# Patient Record
Sex: Male | Born: 1965
Health system: Southern US, Community
[De-identification: ages and names within clinical notes are randomized; demographics above are authoritative.]

## PROBLEM LIST (undated history)

## (undated) DIAGNOSIS — L309 Dermatitis, unspecified: Secondary | ICD-10-CM

## (undated) DIAGNOSIS — I2510 Atherosclerotic heart disease of native coronary artery without angina pectoris: Secondary | ICD-10-CM

## (undated) DIAGNOSIS — T7840XA Allergy, unspecified, initial encounter: Secondary | ICD-10-CM

## (undated) DIAGNOSIS — G4730 Sleep apnea, unspecified: Secondary | ICD-10-CM

## (undated) DIAGNOSIS — E782 Mixed hyperlipidemia: Secondary | ICD-10-CM

## (undated) DIAGNOSIS — Z0000 Encounter for general adult medical examination without abnormal findings: Secondary | ICD-10-CM

## (undated) DIAGNOSIS — O26891 Other specified pregnancy related conditions, first trimester: Secondary | ICD-10-CM

## (undated) DIAGNOSIS — I5020 Unspecified systolic (congestive) heart failure: Secondary | ICD-10-CM

## (undated) DIAGNOSIS — R51 Headache: Secondary | ICD-10-CM

## (undated) DIAGNOSIS — M545 Low back pain: Secondary | ICD-10-CM

## (undated) HISTORY — DX: Headache: R51

## (undated) HISTORY — DX: Allergy, unspecified, initial encounter: T78.40XA

## (undated) HISTORY — DX: Encounter for general adult medical examination without abnormal findings: Z00.00

## (undated) HISTORY — DX: Low back pain: M54.5

## (undated) HISTORY — PX: CARDIAC CATHETERIZATION: SHX172

## (undated) HISTORY — DX: Sleep apnea, unspecified: G47.30

## (undated) HISTORY — DX: Dermatitis, unspecified: L30.9

## (undated) HISTORY — DX: Other specified pregnancy related conditions, first trimester: O26.891

## (undated) HISTORY — DX: Mixed hyperlipidemia: E78.2

---

## 2003-05-02 ENCOUNTER — Encounter: Admission: RE | Admit: 2003-05-02 | Discharge: 2003-05-02 | Payer: Self-pay | Admitting: Family Medicine

## 2003-05-22 ENCOUNTER — Encounter: Admission: RE | Admit: 2003-05-22 | Discharge: 2003-05-22 | Payer: Self-pay | Admitting: Family Medicine

## 2003-10-22 ENCOUNTER — Encounter: Admission: RE | Admit: 2003-10-22 | Discharge: 2003-10-22 | Payer: Self-pay | Admitting: Sports Medicine

## 2004-06-30 ENCOUNTER — Ambulatory Visit: Payer: Self-pay | Admitting: Family Medicine

## 2005-01-10 ENCOUNTER — Emergency Department (HOSPITAL_COMMUNITY): Admission: EM | Admit: 2005-01-10 | Discharge: 2005-01-10 | Payer: Self-pay | Admitting: Emergency Medicine

## 2005-02-12 ENCOUNTER — Ambulatory Visit: Payer: Self-pay | Admitting: Family Medicine

## 2005-10-25 ENCOUNTER — Ambulatory Visit: Payer: Self-pay | Admitting: Sports Medicine

## 2007-03-28 ENCOUNTER — Ambulatory Visit: Payer: Self-pay | Admitting: Family Medicine

## 2007-04-25 ENCOUNTER — Encounter: Payer: Self-pay | Admitting: Family Medicine

## 2007-04-25 ENCOUNTER — Ambulatory Visit: Payer: Self-pay | Admitting: Family Medicine

## 2007-04-25 DIAGNOSIS — T7840XA Allergy, unspecified, initial encounter: Secondary | ICD-10-CM

## 2007-04-25 HISTORY — DX: Allergy, unspecified, initial encounter: T78.40XA

## 2007-04-25 LAB — CONVERTED CEMR LAB
BUN: 15 mg/dL (ref 6–23)
CO2: 26 meq/L (ref 19–32)
Chloride: 103 meq/L (ref 96–112)
Direct LDL: 110 mg/dL — ABNORMAL HIGH
Glucose, Bld: 90 mg/dL (ref 70–99)
LDL Cholesterol: 110 mg/dL

## 2007-04-26 ENCOUNTER — Encounter: Payer: Self-pay | Admitting: Family Medicine

## 2008-10-04 ENCOUNTER — Ambulatory Visit: Payer: Self-pay | Admitting: Family Medicine

## 2008-10-24 ENCOUNTER — Encounter: Admission: RE | Admit: 2008-10-24 | Discharge: 2008-10-24 | Payer: Self-pay | Admitting: Family Medicine

## 2008-11-05 ENCOUNTER — Telehealth (INDEPENDENT_AMBULATORY_CARE_PROVIDER_SITE_OTHER): Payer: Self-pay | Admitting: *Deleted

## 2008-11-29 ENCOUNTER — Encounter: Payer: Self-pay | Admitting: Family Medicine

## 2008-11-29 ENCOUNTER — Ambulatory Visit (HOSPITAL_BASED_OUTPATIENT_CLINIC_OR_DEPARTMENT_OTHER): Admission: RE | Admit: 2008-11-29 | Discharge: 2008-11-29 | Payer: Self-pay | Admitting: Family Medicine

## 2008-12-01 ENCOUNTER — Ambulatory Visit: Payer: Self-pay | Admitting: Internal Medicine

## 2008-12-09 ENCOUNTER — Telehealth: Payer: Self-pay | Admitting: *Deleted

## 2008-12-09 DIAGNOSIS — G4733 Obstructive sleep apnea (adult) (pediatric): Secondary | ICD-10-CM | POA: Insufficient documentation

## 2008-12-11 ENCOUNTER — Ambulatory Visit (HOSPITAL_BASED_OUTPATIENT_CLINIC_OR_DEPARTMENT_OTHER): Admission: RE | Admit: 2008-12-11 | Discharge: 2008-12-11 | Payer: Self-pay | Admitting: Family Medicine

## 2008-12-11 ENCOUNTER — Encounter: Payer: Self-pay | Admitting: Family Medicine

## 2008-12-27 ENCOUNTER — Telehealth: Payer: Self-pay | Admitting: Family Medicine

## 2009-01-14 ENCOUNTER — Encounter: Payer: Self-pay | Admitting: Family Medicine

## 2009-06-26 ENCOUNTER — Encounter: Payer: Self-pay | Admitting: Family Medicine

## 2009-07-03 ENCOUNTER — Encounter: Payer: Self-pay | Admitting: *Deleted

## 2009-07-04 ENCOUNTER — Encounter: Payer: Self-pay | Admitting: *Deleted

## 2009-10-16 ENCOUNTER — Telehealth: Payer: Self-pay | Admitting: Family Medicine

## 2009-12-03 ENCOUNTER — Telehealth: Payer: Self-pay | Admitting: Family Medicine

## 2009-12-03 ENCOUNTER — Ambulatory Visit: Payer: Self-pay | Admitting: Family Medicine

## 2009-12-03 DIAGNOSIS — S139XXA Sprain of joints and ligaments of unspecified parts of neck, initial encounter: Secondary | ICD-10-CM | POA: Insufficient documentation

## 2009-12-24 ENCOUNTER — Encounter: Payer: Self-pay | Admitting: Family Medicine

## 2009-12-24 ENCOUNTER — Ambulatory Visit: Payer: Self-pay | Admitting: Family Medicine

## 2009-12-24 LAB — CONVERTED CEMR LAB
BUN: 11 mg/dL (ref 6–23)
CO2: 28 meq/L (ref 19–32)
Glucose, Bld: 97 mg/dL (ref 70–99)
HCT: 42.8 % (ref 39.0–52.0)
MCV: 86.3 fL (ref 78.0–100.0)
RBC: 4.96 M/uL (ref 4.22–5.81)
RDW: 13.4 % (ref 11.5–15.5)
Total Protein: 7.4 g/dL (ref 6.0–8.3)

## 2009-12-25 ENCOUNTER — Encounter: Payer: Self-pay | Admitting: Family Medicine

## 2010-07-28 NOTE — Progress Notes (Signed)
Summary: refill  Phone Note Refill Request Call back at [phone] Message from:  Patient  Refills Requested: Medication #1:  ALLEGRA 60 MG TABS 1 tab by mouth two times a day [facility]  Initial call taken by: De Nurse,  October 16, 2009 11:21 AM    Prescriptions: ALLEGRA 60 MG TABS (FEXOFENADINE HCL) 1 tab by mouth two times a day  #60 x 0   Entered and Authorized by:   Angeline Slim MD   Signed by:   Angeline Slim MD on 10/16/2009   Method used:   [facility]        [facility]  [place] [address]       [address]       [place], [place]  [address]       Ph: [phone] or [phone]       Fax: [phone]   RxID:   [id number]

## 2010-07-28 NOTE — Assessment & Plan Note (Signed)
Summary: cpe,tcb   Vital Signs:  Patient profile:   45 year old male Height:      69 inches Weight:      176.0 pounds BMI:     26.08 Temp:     97.0 degrees F Pulse rate:   63 / minute BP sitting:   131 / 80  (right arm)  Vitals Entered By: Starleen Blue RN 12/24/2009 CC: cpe Is Patient Diabetic? No Pain Assessment Patient in pain? no        Primary Care Provider:  Jerrell Mangel MD  CC:  cpe.  History of Present Illness: 45 y/o M here for cpe  1. OSA: Using CPAP x 1 year.  Feeling much better now that he is using CPAP.  He is sleeping better.  He tried to not use it for the past 2 months and has had more frequent headaches.  He attributes this to not using CPAP.   2.  Back/neck pain:  was seen earlier this month for cervical muscle strain.  Dr Clotilde Dieter rx flexeril.  He took this with aleve and it relieved the neck pain.  He still has occasional neck pain, esp when he works.  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  When he is at home on the weekend or on vacation he has noticed less neck strain.      Habits & Providers  Alcohol-Tobacco-Diet     Alcohol drinks/day: 0     Tobacco Status: never     Diet Comments: Vegetarian diet  Exercise-Depression-Behavior     Does Patient Exercise: yes     Exercise Counseling: not indicated; exercise is adequate     Type of exercise: weights, cardio     Exercise (avg: min/session): 60     Times/week: 3     Have you felt down or hopeless? no     Have you felt little pleasure in things? no     Depression Counseling: not indicated; screening negative for depression     STD Risk: never     Drug Use: never     Seat Belt Use: always     Sun Exposure: frequent  Current Medications (verified): 1)  Allegra 60 Mg Tabs (Fexofenadine Hcl) .Marland Kitchen.. 1 Tab By Mouth Two Times A Day 2)  Fluticasone Propionate 50 Mcg/act Susp (Fluticasone Propionate) .... 2 Sprays Each Nostril Daily  Allergies (verified): No Known Drug Allergies  Past  History:  Family History: Last updated: 12/24/2009 Paternal grandmother with esophageal cancer Father: Hypotension Sisters and Brothers: healthy  Social History: Last updated: 12/24/2009 Patient is from [place] but lives in [place] with wife and 2 kids (8y/o son, 62 y/o daughter).  Wife works from home, but her office is in [place].  Remainder of family lives in [place].   Both parents alive with no active disease.   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   Enjoys tennis. No tobacco, etoh, or illicits. Vegetarian  Risk Factors: Alcohol Use: 0 (12/24/2009) Diet: Vegetarian diet (12/24/2009) Exercise: yes (12/24/2009)  Risk Factors: Smoking Status: never (12/24/2009)  Past Medical History: Allergic rhinitis OSA, on CPAP 5-8hrs a day.   Family History: Paternal grandmother with esophageal cancer Father: Hypotension Sisters and Brothers: healthy  Social History: Patient is from [place] but lives in [place] with wife and 2 kids (8y/o son, 64 y/o daughter).  Wife works from home, but her office is in [place].  Remainder of family lives in [place].  Both parents alive with no active disease.   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   Enjoys tennis. No tobacco, etoh, or illicits. VegetarianDoes Patient Exercise:  yes STD Risk:  never Drug Use:  never Seat Belt Use:  always Sun Exposure-Excessive:  frequent  Review of Systems  The patient denies fever, weight loss, weight gain, vision loss, decreased hearing, hoarseness, chest pain, syncope, dyspnea on exertion, peripheral edema, prolonged cough, headaches, hemoptysis, abdominal pain, melena, hematochezia, severe indigestion/heartburn, hematuria, incontinence, muscle weakness, suspicious skin lesions, difficulty walking, depression, unusual weight change, and abnormal bleeding.         some bilateral knee pain, esp goin up steps.    Physical Exam  General:  Well-developed,well-nourished,in no acute distress; alert,appropriate and  cooperative throughout examination. vitals reviewed Head:  Normocephalic and atraumatic without obvious abnormalities. No apparent alopecia or balding. Eyes:  No corneal or conjunctival inflammation noted. EOMI. Perrla. Funduscopic exam benign, without hemorrhages, exudates or papilledema. Vision grossly normal. Ears:  External ear exam shows no significant lesions or deformities.  Otoscopic examination reveals clear canals, tympanic membranes are intact bilaterally without bulging, retraction, inflammation or discharge. Hearing is grossly normal bilaterally. Nose:  External nasal examination shows no deformity or inflammation. Nasal mucosa are pink and moist without lesions or exudates. Mouth:  Oral mucosa and oropharynx without lesions or exudates.  Teeth in good repair. Neck:  No deformities, masses, or tenderness noted. Lungs:  Normal respiratory effort, chest expands symmetrically. Lungs are clear to auscultation, no crackles or wheezes. Heart:  Normal rate and regular rhythm. S1 and S2 normal without gallop, murmur, click, rub or other extra sounds. Abdomen:  Bowel sounds positive,abdomen soft and non-tender without masses, organomegaly or hernias noted. Msk:  No deformity or scoliosis noted of thoracic or lumbar spine.   Pulses:  R and L carotid,radial,femoral,dorsalis pedis and posterior tibial pulses are full and equal bilaterally Extremities:  No clubbing, cyanosis, edema, or deformity noted with normal full range of motion of all joints.   Neurologic:  No cranial nerve deficits noted. Station and gait are normal. Plantar reflexes are down-going bilaterally. DTRs are symmetrical throughout. Sensory, motor and coordinative functions appear intact. Skin:  Intact without suspicious lesions or rashes Cervical Nodes:  No lymphadenopathy noted Axillary Nodes:  No palpable lymphadenopathy Inguinal Nodes:  No significant adenopathy Psych:  Cognition and judgment appear intact. Alert and  cooperative with normal attention span and concentration. No apparent delusions, illusions, hallucinations   Impression & Recommendations:  Problem # 1:  PREVENTIVE HEALTH CARE (ICD-V70.0) Healthy male.  No risky behaviors.  Works in Consulting civil engineer.  Will check CBC and Cmet today.  Last FLP done in 2008 with LDL 110.  No risk factors for CAD, DM.   Weight is stable.  Pt is exercising 3x/week at 60 min each time.  No alcohol, drugs, tobacco.   We discussed colonoscopy when he turns 49.  We also discussed flu vaccine. This last winter he got vaccinated from work.  He does travel to [place] every 2 yrs, so we can consider doing PPD.  Orders: [facility] - Est  40-64 yrs ([address])  Problem # 2:  SLEEP APNEA, OBSTRUCTIVE (ICD-327.23) Assessment: Improved Doing well on CPAP.  He brought me his settings.  Will scan into file.   Orders: Comp Met-[facility] [phone]) CBC-[facility] ([address]) [facility] - Est  40-64 yrs ([address])  Complete Medication List: 1)  Allegra 60 Mg Tabs (Fexofenadine hcl) .Marland Kitchen.. 1 tab by mouth two times a day 2)  Fluticasone Propionate 50 Mcg/act Susp (Fluticasone propionate) .... 2 sprays each nostril daily   Prevention & Chronic Care Immunizations   Influenza vaccine: given  (03/28/2008)   Influenza vaccine due: 03/28/2009    Tetanus booster: 04/25/2007: Tdap   Tetanus booster due: 04/24/2017    Pneumococcal vaccine: Not documented  Other Screening   Smoking status: never  (12/24/2009)  Lipids   Total Cholesterol: Not documented   LDL: 110  (04/25/2007)   LDL Direct: 110  (04/25/2007)   HDL: Not documented   Triglycerides: Not documented

## 2010-07-28 NOTE — Miscellaneous (Signed)
Summary: cpap  Clinical Lists Changes cpap authorized thru 4/11.Golden Circle RN  July 03, 2009 3:50 PM

## 2010-07-28 NOTE — Letter (Signed)
Summary: Generic Letter: Lab result  [facility]  [address]   [place], [place] [address]   Phone: [phone]  Fax: [phone]    12/25/2009  Michael Gibbs [address] [place], [place]  [address]  Dear [name],   Your lab result from blood test for yesterday showed:  No Anemia. Normal WBC. Normal Electrolytes. Normal Kidney funtion. Normal Liver funtion.       Sincerely,   Valentin Benney MD  Appended Document: Generic Letter: Lab result letter mailed.

## 2010-07-28 NOTE — Letter (Signed)
Summary: Letter of Deniel from [facility]  Letter of Deniel from [employer]   Imported By: Clydell Hakim 07/14/2009 14:59:27  _____________________________________________________________________  External Attachment:    Type:   Image     Comment:   External Document

## 2010-07-28 NOTE — Progress Notes (Signed)
Summary: triage  Phone Note Call from Patient Call back at Work Phone [phone]   Caller: Patient Summary of Call: Has a catch in his back and wondering what he can do for it? Initial call taken by: Clydell Hakim,  December 03, 2009 8:43 AM  Follow-up for Phone Call        started yesterday am. thinks he slept "incorrectly" has not taken any thing for the pain. states this is the 3rd or 4th time in 2 yrs. work in with Dr. Clotilde Dieter at 1:30 Follow-up by: Golden Circle RN,  December 03, 2009 8:50 AM

## 2010-07-28 NOTE — Miscellaneous (Signed)
Summary: cpap not covered  Clinical Lists Changes pt went to [facility] to get CPAP titrated. per letter from [facility], this will not be covered since we did not obtain a preauthorization. we cannot bill pt for this either. letter to pcp & K. Malen Gauze, Charity fundraiser.Golden Circle RN  July 04, 2009 4:48 PM called pt and lmvm to call us back.  see previous notes. Arlyss Repress CMA,  July 08, 2009 3:28 PM pt never called back.Arlyss Repress CMA,  July 29, 2009 5:25 PM

## 2010-07-28 NOTE — Assessment & Plan Note (Signed)
Summary: neck (trapezius) muscle strain   Vital Signs:  Patient profile:   45 year old male Weight:      175.5 pounds Temp:     97.6 degrees F oral Pulse rate:   62 / minute Pulse rhythm:   regular BP sitting:   118 / 78  (right arm) Cuff size:   regular  Vitals Entered By: Loralee Pacas CMA (December 03, 2009 1:43 PM) CC: neck pain Is Patient Diabetic? No Pain Assessment Patient in pain? yes     Location: neck, upper back Intensity: 7 Type: sharp Onset of pain  Constant Comments pt states that he woke up yesterday and neck and back was hurting when he turns his head the pain gets worse   Primary Care Provider:  Cat Ta MD  CC:  neck pain.  History of Present Illness: Neck pain: Pt has had this same next pain 4 times. The first time it was 3 years ago when he was lifting his dauhter our of the car and instantly felt sharp pain in his neck. He said it went away in 2 days. He did almost the same thing 2 times last year each taking about 4 days to go away.  Yesterday morning he woke up with sharp pain and loss of motion in his neck on the left side. He did his regular exercises on Monday and did not feel any pain and did not have any injury. He has not taken anything by mouth, he has rubbed some Vicks Vapor rub on his muscle. He has scheduled a massage for tomorrow.  Current Medications (verified): 1)  Allegra 60 Mg Tabs (Fexofenadine Hcl) .Marland Kitchen.. 1 Tab By Mouth Two Times A Day 2)  Fluticasone Propionate 50 Mcg/act Susp (Fluticasone Propionate) .... 2 Sprays Each Nostril Daily 3)  Patanol 0.1 % Soln (Olopatadine Hcl) .Marland Kitchen.. 1 Drop Each Eye Twice A Day For Eye Allergies 4)  Cyclobenzaprine Hcl 10 Mg Tabs (Cyclobenzaprine Hcl) .... Take 1 Pill At Bedtime  Allergies (verified): No Known Drug Allergies  Review of Systems        vitals reviewed and pertinent negatives and positives seen in HPI   Physical Exam  General:  Well-developed,well-nourished,in no acute distress;  alert,appropriate and cooperative throughout examination Head:  decreased ROM while turning head to right and left.  Neck:  no tenderness along midline of neck but tight muscles along left trapezius muscles and tenderness to palpation to trapezius muscle.  Msk:  no tenderness along midline of neck but tight muscles along left trapezius muscles and tenderness to palpation to trapezius muscle. no joint tenderness, no joint swelling, and no joint warmth.     Impression & Recommendations:  Problem # 1:  CERVICAL MUSCLE STRAIN (ICD-847.0) Assessment New (see instruction). Told pt he could go back to his normal exercise when the pain is gone (Sunday or Monday). Demonstrated proper body mechanics for him.   His updated medication list for this problem includes:    Cyclobenzaprine Hcl 10 Mg Tabs (Cyclobenzaprine hcl) .Marland Kitchen... Take 1 pill at bedtime  Orders: [facility]- Est Level  3 ([address])  Complete Medication List: 1)  Allegra 60 Mg Tabs (Fexofenadine hcl) .Marland Kitchen.. 1 tab by mouth two times a day 2)  Fluticasone Propionate 50 Mcg/act Susp (Fluticasone propionate) .... 2 sprays each nostril daily 3)  Patanol 0.1 % Soln (Olopatadine hcl) .Marland Kitchen.. 1 drop each eye twice a day for eye allergies 4)  Cyclobenzaprine Hcl 10 Mg Tabs (Cyclobenzaprine hcl) .... Take 1  pill at bedtime  Patient Instructions: 1)  Use the heating pad when home for 20 minutes at a time.  2)  Use Aleve every 8 hours.  3)  Use Flexerill at night (it will make you sleepy so don't drive with it). 4)  Get Capsasin cream and run it into the muscle.  Prescriptions: CYCLOBENZAPRINE HCL 10 MG TABS (CYCLOBENZAPRINE HCL) take 1 pill at bedtime  #14 x 0   Entered and Authorized by:   Jamie Brookes MD   Signed by:   Jamie Brookes MD on 12/03/2009   Method used:   [facility]  [place] [address]       [address]       [place], [place]  [address]       Ph: [phone] or [phone]       Fax: [phone]   RxID:    [id number]

## 2010-10-15 IMAGING — CR DG CHEST 2V
2 series · 2 of 2 positions shown · non-contrast
Comparison: None available.

CLINICAL DATA: Hemoptysis.  786.3.

CHEST - 2 VIEW

[w chest pa]
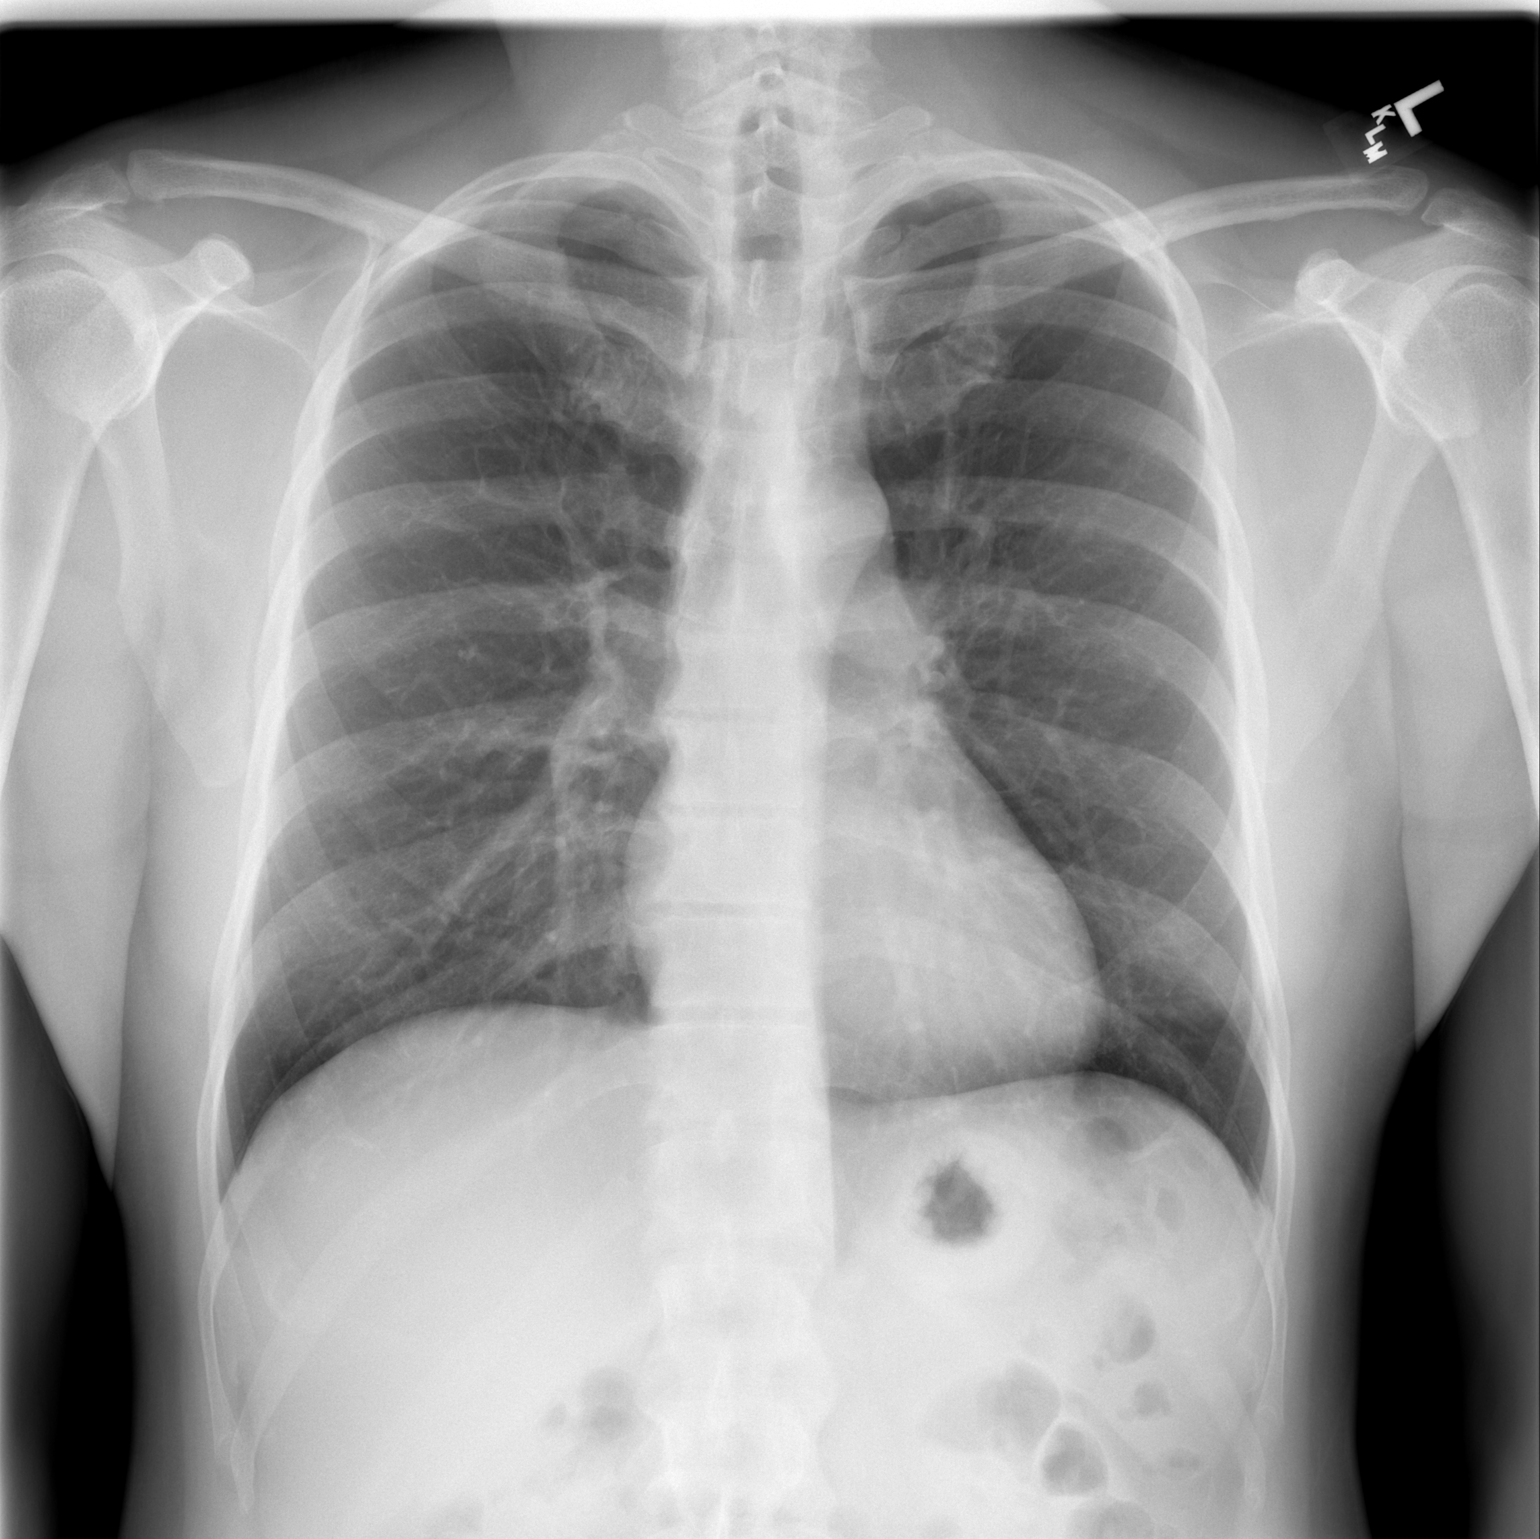

[w chest lat]
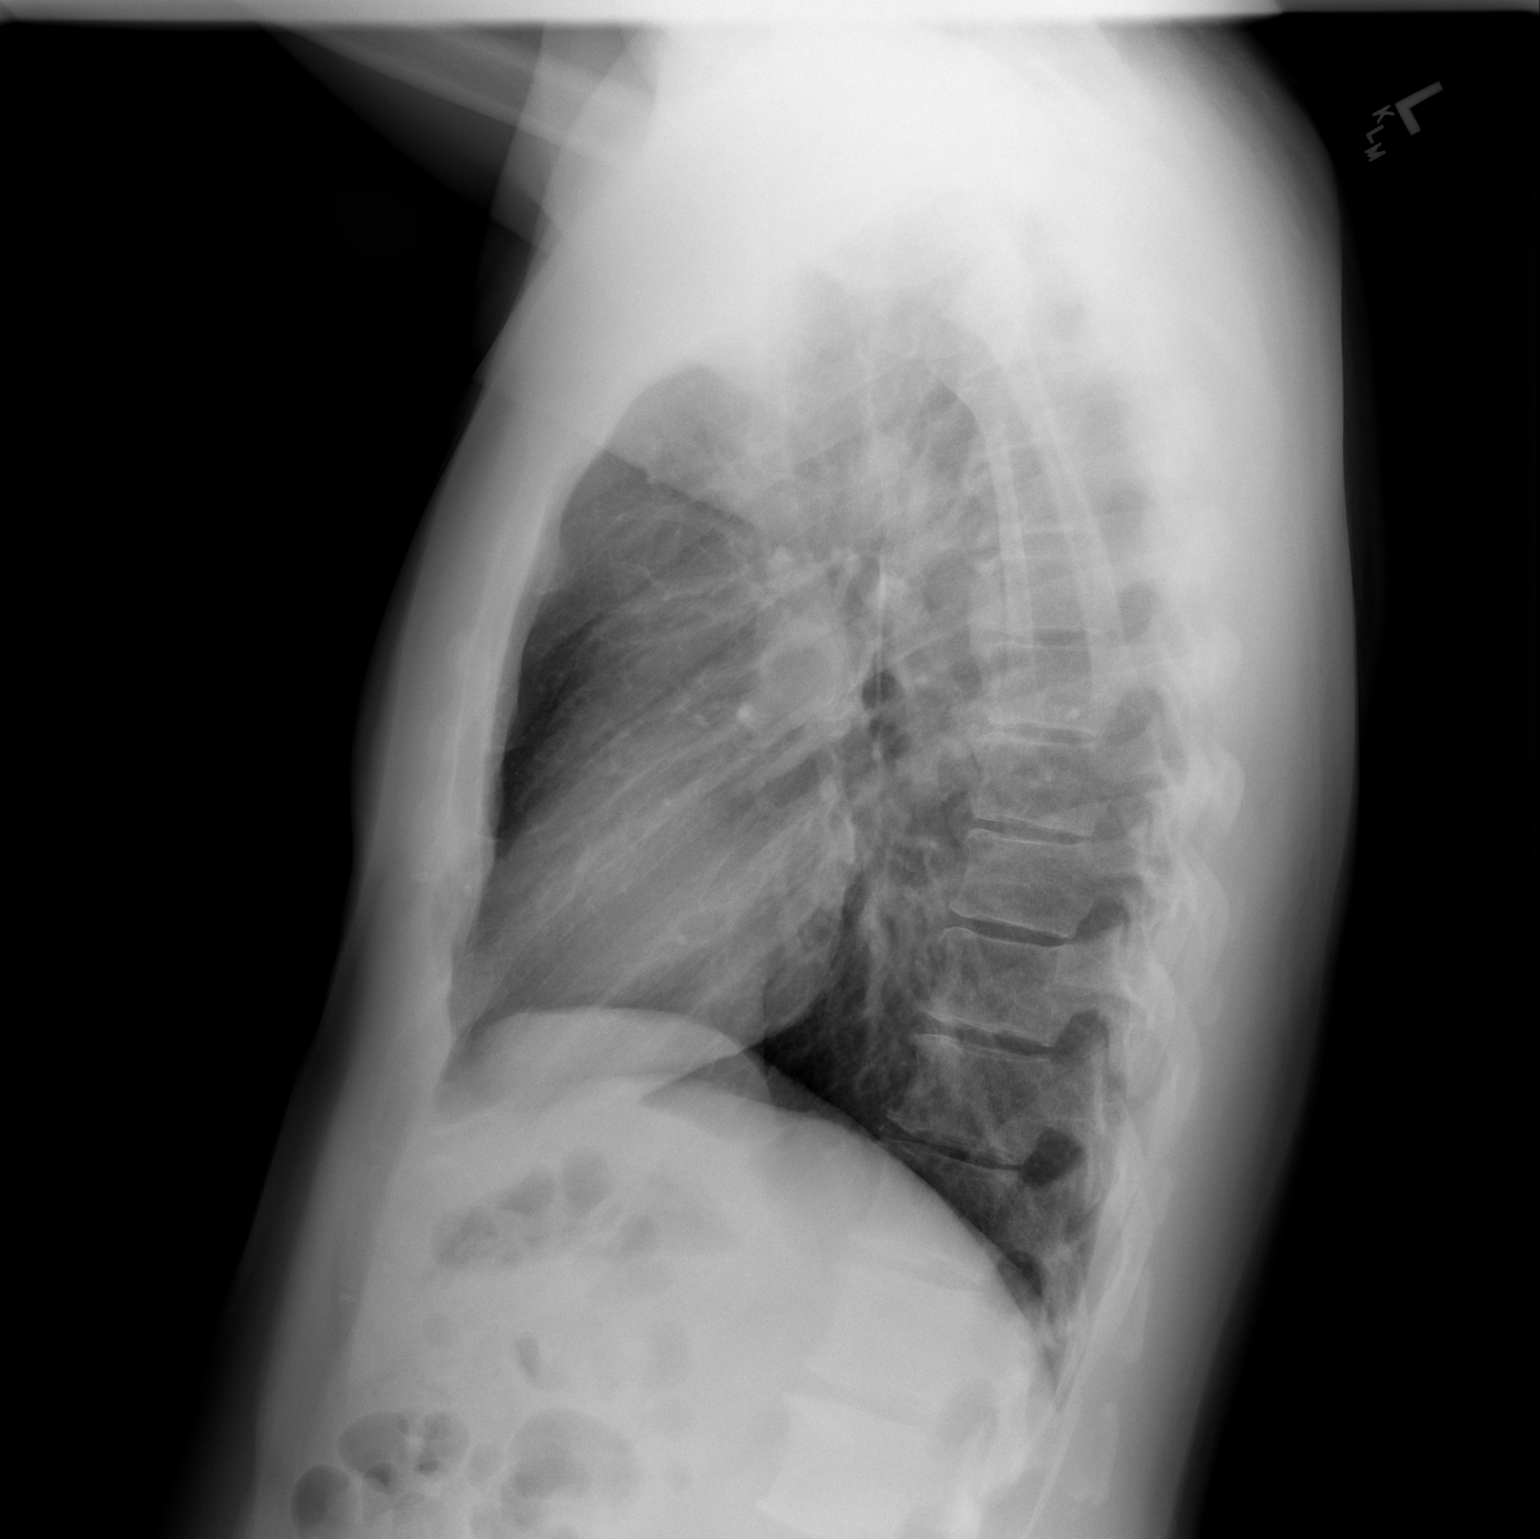

[2 of 2 positions shown; findings below may reference images not displayed]

FINDINGS: The cardiopericardial silhouette is within normal limits
for size.  The lungs are clear.  Minimal degenerative changes are
noted in the thoracic spine.  The visualized soft tissues and bony
thorax are otherwise unremarkable.
IMPRESSION: No acute cardiopulmonary disease.

## 2010-11-10 NOTE — Procedures (Signed)
Michael Gibbs, Michael Gibbs       ACCOUNT NO.:  [id number]   MEDICAL RECORD NO.:  [id number]          PATIENT TYPE:  OUT   LOCATION:  SLEEP CENTER                 FACILITY:  [facility]   PHYSICIAN:  Clinton D. Maple Hudson, MD, FCCP, FACPDATE OF BIRTH:  1965-06-09   DATE OF STUDY:  11/29/2008                            NOCTURNAL POLYSOMNOGRAM   REFERRING PHYSICIAN:  Norton Blizzard, M.D.   REFERRING PHYSICIAN:  Norton Blizzard, MD   INDICATION FOR STUDY:  Insomnia with sleep apnea.   EPWORTH SLEEPINESS SCORE:  8/24, BMI 26, weight 176 pounds, height 69  inches, neck 16 inches.   MEDICATIONS:  Home medications, none listed.   SLEEP ARCHITECTURE:  Total sleep time 316 minutes with sleep efficiency  90.5%.  Stage I was 2.2%, stage II 65.3%, stage III 2.7%, REM 29.7% of  total sleep time.  Sleep latency 19 minutes, REM latency 56 minutes,  awake after sleep onset 11.5 minutes, arousal index 36.6.  No bedtime  medication was taken.   RESPIRATORY DATA:  Apnea-hypopnea index (AHI) 7.8 per hour.  A total of  41 events was scored including 29 obstructive apneas, 2 mixed apneas,  and 10 hypopneas.  Events were seen in all positions but more common  while supine.  REM AHI 21.1.  An additional 9 events were scored with  respiratory effort related to arousal for accumulative respiratory  disturbance index (RDI) of 9.5 per hour.  There were insufficient events  to permit CPAP titration by split protocol on the study night.   OXYGEN DATA:  Moderately loud snoring with oxygen desaturation to a  nadir of 78%.  Mean oxygen saturation through the study was 97% on room  air.   CARDIAC DATA:  Sinus rhythm with occasional PVC.   MOVEMENT-PARASOMNIA:  No significant movement disturbance.  No bathroom  trips.   IMPRESSIONS-RECOMMENDATIONS:  1. Mild obstructive sleep apnea/hypopnea syndrome, apnea-hypopnea      index 7.8 per hour.  Events were more common while supine.      Moderately loud snoring with oxygen  desaturation to a nadir of 78%.  2. There were insufficient events on the study night to permit      continuous positive airway pressure titration by      split protocol in the time allowed.  Consider return for CPAP      titration if more conservative measures are inappropriate or      ineffective.      Clinton D. Maple Hudson, MD, [facility], FACP  Diplomate, Biomedical engineer of Sleep Medicine  Electronically Signed     CDY/MEDQ  D:  12/01/2008 15:03:57  T:  12/02/2008 03:31:00  Job:  [id number]

## 2010-11-10 NOTE — Procedures (Signed)
NAMELUCUS, LAMBERTSON       ACCOUNT NO.:  [id number]   MEDICAL RECORD NO.:  [id number]          PATIENT TYPE:  OUT   LOCATION:  SLEEP CENTER                 [facility]ACILITY[facility]   PHYSICIAN:  Clinton D. Maple Hudson, MD, FCCP, FACPDATE OF BIRTH:  11-22-1964   DATE OF STUDY:  12/11/2008                            NOCTURNAL POLYSOMNOGRAM   REFERRING PHYSICIAN:  Norton Blizzard, M.D.   REFERRING PHYSICIAN:  Norton Blizzard, MD   INDICATIONS FOR STUDY:  Hypersomnia with sleep apnea.   EPWORTH SLEEPINESS SCORE:  Epworth sleepiness score 3/24.  BMI 26.  Weight 176 pounds, height 69 inches.  Neck 16 inches.   HOME MEDICATION:  None charted.   A baseline diagnostic NPSG on November 29, 2008, gave AHI of 7.8 per hour.  CPAP titration is requested.   SLEEP ARCHITECTURE:  Total sleep time 322 minutes with sleep efficiency  88.2%.  Stage I was 4.3%, stage II 62.1%, stage III 0.6%, REM 32.9% of  total sleep time.  Sleep latency 9 minutes, REM latency 51.5 minutes,  awake after sleep onset 34 minutes, arousal index 31.9.  No bedtime  medication was taken.   RESPIRATORY DATA:  CPAP titration protocol.  CPAP was titrated to 10  CWP, AHI 0 per hour.  He wore a medium ResMed Quattro full-face mask  with heated humidifier.   OXYGEN DATA:  Moderate snoring before CPAP control, completely  suppressed by CPAP.  Mean oxygen saturation on CPAP was 97.5% on room  air.   CARDIAC DATA:  Sinus rhythm with occasional PAC.   MOVEMENT/PARASOMNIA:  No significant movement disturbance.  No bathroom  trips.   IMPRESSION/RECOMMENDATIONS:  1. Successful CPAP titration to 10 CWP, AHI 0 per hour.  He wore a      medium ResMed Quattro full-face mask with heated humidifier.  2. Baseline diagnostic NPSG on November 29, 2008, gave an AHI of 7.8 per      hour.  3. Sleep architecture on the current study was significant for      increased percentage of time in REM, consistent with REM rebound as      a therapeutic response to  CPAP with improved sleep architecture.      Clinton D. Maple Hudson, MD, [facility], FACP  Diplomate, Biomedical engineer of Sleep Medicine  Electronically Signed     CDY/MEDQ  D:  12/15/2008 09:22:17  T:  12/15/2008 21:53:16  Job:  [id number]

## 2011-03-08 ENCOUNTER — Telehealth: Payer: Self-pay | Admitting: Family Medicine

## 2011-03-08 NOTE — Telephone Encounter (Signed)
Request form for cpap equipment sent in for me to sign/approve.  Will sign these forms so that pt can continue cpap, but pt has not had recent eval here at the clinic.  Pt needs to come in for complete physical and follow up on chronic medical problems.  Will ask administrative staff to call pt and schedule f/up appointment.

## 2011-04-02 ENCOUNTER — Encounter: Payer: Self-pay | Admitting: Family Medicine

## 2011-04-02 ENCOUNTER — Ambulatory Visit (INDEPENDENT_AMBULATORY_CARE_PROVIDER_SITE_OTHER): Payer: BC Managed Care – PPO | Admitting: Family Medicine

## 2011-04-02 VITALS — BP 115/71 | HR 61 | Wt 175.0 lb

## 2011-04-02 DIAGNOSIS — Z0000 Encounter for general adult medical examination without abnormal findings: Secondary | ICD-10-CM

## 2011-04-02 DIAGNOSIS — G4730 Sleep apnea, unspecified: Secondary | ICD-10-CM

## 2011-04-02 DIAGNOSIS — G4733 Obstructive sleep apnea (adult) (pediatric): Secondary | ICD-10-CM

## 2011-04-02 DIAGNOSIS — Z719 Counseling, unspecified: Secondary | ICD-10-CM

## 2011-04-02 DIAGNOSIS — J301 Allergic rhinitis due to pollen: Secondary | ICD-10-CM

## 2011-04-02 DIAGNOSIS — Z7189 Other specified counseling: Secondary | ICD-10-CM

## 2011-04-02 HISTORY — DX: Encounter for general adult medical examination without abnormal findings: Z00.00

## 2011-04-02 NOTE — Progress Notes (Signed)
  Subjective:    [name], male    DOB: 18-Jul-1965, 45 y.o.   MRN: [id number]  HPI Patient is here for annual physical:  Past history, family history, surgical history, medications all reviewed and updated.  CPAP- States that he wears this every night, he can tell a difference if he doesn't wear it with daytime sleepiness, he still has some tiredness on awakening to 3 days per week, he has noticed this for the past 6-7 months. He spoke to [facility] and they changed some of the pieces to ensure a good fit. He states he goes to bed at 11 PM to 6 AM, on the weekends he is able to sleep longer and does feel more more rested.   Social history- Patient denies alcohol, tobacco, drugs. Patient exercises to 3 times per week for one hour in gym. He also plays tennis occasionally.     Review of Systems As per above. No chest pain. No shortness of breath.    Objective:   Physical Exam  Constitutional: He is oriented to person, place, and time. He appears well-developed and well-nourished.  HENT:  Head: Normocephalic and atraumatic.  Mouth/Throat: Oropharynx is clear and moist. No oropharyngeal exudate.  Eyes: Right eye exhibits no discharge. Left eye exhibits no discharge. No scleral icterus.  Cardiovascular: Normal rate and regular rhythm.   No murmur heard. Pulmonary/Chest: Effort normal. No respiratory distress.  Abdominal: Soft. He exhibits no distension.  Musculoskeletal: He exhibits no edema.  Neurological: He is alert and oriented to person, place, and time.  Skin: No rash noted.       Scattered small benign-appearing moles and cherry angiomas on back and abdomen, and arms.  Psychiatric: He has a normal mood and affect. His behavior is normal.          Assessment & Plan:

## 2011-04-02 NOTE — Assessment & Plan Note (Signed)
Patient given sleep hygiene handout. Patient is to review to see if he can improve sleep quality. If patient continues to have 2-3 nights where he does not feel rested in the morning per week after improving sleep hygiene-patient is to let me know and I will send her back to the sleep apnea Dr. to see if settings on CPAP need to be adjusted. Patient has been using current settings x2 years.

## 2011-04-02 NOTE — Patient Instructions (Signed)
Sleep quality: Review sleep hygiene handout.  See if there are things you can do to improve your quality of sleep.  If after changing these things you still wake up tired in the morning let me know and we can send you back to the sleep doctor to see if any changes need to be made to the settings on your CPAP.  Lab work: Return for fasting lab work.  Just make an appointment with the lab.  I will mail the results to you.  Keep up the exercise!  Return in 1 year for physical or sooner if needed.

## 2011-04-02 NOTE — Assessment & Plan Note (Signed)
We'll get flu shot at work next week, will screen cholesterol-patient to return for fasting lipid panel. Will also obtain complete metabolic panel in the case the patient needs medication for cholesterol will need a baseline liver function. Encouraged patient to stay active-patient currently going to gym to 3 times per week for one hour. Also plays tennis.

## 2011-04-21 ENCOUNTER — Other Ambulatory Visit: Payer: BC Managed Care – PPO

## 2011-04-21 DIAGNOSIS — G4730 Sleep apnea, unspecified: Secondary | ICD-10-CM

## 2011-04-21 LAB — COMPREHENSIVE METABOLIC PANEL
ALT: 14 U/L (ref 0–53)
AST: 18 U/L (ref 0–37)
Alkaline Phosphatase: 90 U/L (ref 39–117)
Creat: 0.81 mg/dL (ref 0.50–1.35)
Total Bilirubin: 1.2 mg/dL (ref 0.3–1.2)

## 2011-04-21 LAB — LIPID PANEL
Cholesterol: 169 mg/dL (ref 0–200)
HDL: 36 mg/dL — ABNORMAL LOW (ref >39)
LDL Cholesterol: 111 mg/dL — ABNORMAL HIGH (ref 0–99)
Total CHOL/HDL Ratio: 4.7 Ratio
Triglycerides: 112 mg/dL (ref <150)
VLDL: 22 mg/dL (ref 0–40)

## 2011-04-21 NOTE — Progress Notes (Signed)
CMP AND FLP DONE TODAY Michael Gibbs 

## 2011-04-28 ENCOUNTER — Encounter: Payer: Self-pay | Admitting: Family Medicine

## 2011-09-20 ENCOUNTER — Encounter: Payer: Self-pay | Admitting: Family Medicine

## 2011-09-20 ENCOUNTER — Ambulatory Visit (INDEPENDENT_AMBULATORY_CARE_PROVIDER_SITE_OTHER): Payer: BC Managed Care – PPO | Admitting: Family Medicine

## 2011-09-20 ENCOUNTER — Other Ambulatory Visit: Payer: Self-pay | Admitting: Family Medicine

## 2011-09-20 DIAGNOSIS — Z0000 Encounter for general adult medical examination without abnormal findings: Secondary | ICD-10-CM

## 2011-09-20 DIAGNOSIS — G4730 Sleep apnea, unspecified: Secondary | ICD-10-CM | POA: Insufficient documentation

## 2011-09-20 DIAGNOSIS — L309 Dermatitis, unspecified: Secondary | ICD-10-CM

## 2011-09-20 DIAGNOSIS — L259 Unspecified contact dermatitis, unspecified cause: Secondary | ICD-10-CM

## 2011-09-20 DIAGNOSIS — B354 Tinea corporis: Secondary | ICD-10-CM

## 2011-09-20 DIAGNOSIS — Z719 Counseling, unspecified: Secondary | ICD-10-CM

## 2011-09-20 DIAGNOSIS — Z7189 Other specified counseling: Secondary | ICD-10-CM

## 2011-09-20 DIAGNOSIS — R51 Headache: Secondary | ICD-10-CM

## 2011-09-20 DIAGNOSIS — T7840XA Allergy, unspecified, initial encounter: Secondary | ICD-10-CM

## 2011-09-20 MED ORDER — CLOTRIMAZOLE-BETAMETHASONE 1-0.05 % EX CREA: TOPICAL_CREAM | Freq: Two times a day (BID) | CUTANEOUS | Status: AC

## 2011-09-20 NOTE — Patient Instructions (Signed)
Ringworm, Body [Tinea Corporis] Ringworm is a fungal infection of the skin and hair. Another name for this problem is Tinea Corporis. It has nothing to do with worms. A fungus is an organism that lives on dead cells (the outer layer of skin). It can involve the entire body. It can spread from infected pets. Tinea corporis can be a problem in wrestlers who may get the infection form other players/opponents, equipment and mats. DIAGNOSIS  A skin scraping can be obtained from the affected area and by looking for fungus under the microscope. This is called a KOH examination.  HOME CARE INSTRUCTIONS   Ringworm may be treated with a topical antifungal cream, ointment, or oral medications.   If you are using a cream or ointment, wash infected skin. Dry it completely before application.   Scrub the skin with a buff puff or abrasive sponge using a shampoo with ketoconazole to remove dead skin and help treat the ringworm.   Have your pet treated by your veterinarian if it has the same infection.  SEEK MEDICAL CARE IF:   Your ringworm patch (fungus) continues to spread after 7 days of treatment.   Your rash is not gone in 4 weeks. Fungal infections are slow to respond to treatment. Some redness (erythema) may remain for several weeks after the fungus is gone.   The area becomes red, warm, tender, and swollen beyond the patch. This may be a secondary bacterial (germ) infection.   You have a fever.  Document Released: 06/11/2000 Document Revised: 06/03/2011 Document Reviewed: 11/22/2008 [facility] Patient Information 2012 [place], [place]   .Preventive Care for Adults, Male A healthy lifestyle and preventative care can promote health and wellness. Preventative health guidelines for men include the following key practices:  A routine yearly physical is a good way to check with your caregiver about your health and preventative screening. It is a chance to share any concerns and updates on your health, and  to receive a thorough exam.   Visit your dentist for a routine exam and preventative care every 6 months. Brush your teeth twice a day and floss once a day. Good oral hygiene prevents tooth decay and gum disease.   The frequency of eye exams is based on your age, health, family medical history, use of contact lenses, and other factors. Follow your caregiver's recommendations for frequency of eye exams.   Eat a healthy diet. Foods like vegetables, fruits, whole grains, low-fat dairy products, and lean protein foods contain the nutrients you need without too many calories. Decrease your intake of foods high in solid fats, added sugars, and salt. Eat the right amount of calories for you.Get information about a proper diet from your caregiver, if necessary.   Regular physical exercise is one of the most important things you can do for your health. Most adults should get at least 150 minutes of moderate-intensity exercise (any activity that increases your heart rate and causes you to sweat) each week. In addition, most adults need muscle-strengthening exercises on 2 or more days a week.   Maintain a healthy weight. The body mass index (BMI) is a screening tool to identify possible weight problems. It provides an estimate of body fat based on height and weight. Your caregiver can help determine your BMI, and can help you achieve or maintain a healthy weight.For adults 20 years and older:   A BMI below 18.5 is considered underweight.   A BMI of 18.5 to 24.9 is normal.   A BMI  of 25 to 29.9 is considered overweight.   A BMI of 30 and above is considered obese.   Maintain normal blood lipids and cholesterol levels by exercising and minimizing your intake of saturated fat. Eat a balanced diet with plenty of fruit and vegetables. Blood tests for lipids and cholesterol should begin at age 43 and be repeated every 5 years. If your lipid or cholesterol levels are high, you are over 50, or you are a high risk  for heart disease, you may need your cholesterol levels checked more frequently.Ongoing high lipid and cholesterol levels should be treated with medicines if diet and exercise are not effective.   If you smoke, find out from your caregiver how to quit. If you do not use tobacco, do not start.   If you choose to drink alcohol, do not exceed 2 drinks per day. One drink is considered to be 12 ounces (355 mL) of beer, 5 ounces (148 mL) of wine, or 1.5 ounces (44 mL) of liquor.   Avoid use of street drugs. Do not share needles with anyone. Ask for help if you need support or instructions about stopping the use of drugs.   High blood pressure causes heart disease and increases the risk of stroke. Your blood pressure should be checked at least every 1 to 2 years. Ongoing high blood pressure should be treated with medicines, if weight loss and exercise are not effective.   If you are 64 to 47 years old, ask your caregiver if you should take aspirin to prevent heart disease.   Diabetes screening involves taking a blood sample to check your fasting blood sugar level. This should be done once every 3 years, after age 63, if you are within normal weight and without risk factors for diabetes. Testing should be considered at a younger age or be carried out more frequently if you are overweight and have at least 1 risk factor for diabetes.   Colorectal cancer can be detected and often prevented. Most routine colorectal cancer screening begins at the age of 65 and continues through age 74. However, your caregiver may recommend screening at an earlier age if you have risk factors for colon cancer. On a yearly basis, your caregiver may provide home test kits to check for hidden blood in the stool. Use of a small camera at the end of a tube, to directly examine the colon (sigmoidoscopy or colonoscopy), can detect the earliest forms of colorectal cancer. Talk to your caregiver about this at age 85, when routine screening  begins. Direct examination of the colon should be repeated every 5 to 10 years through age 36, unless early forms of pre-cancerous polyps or small growths are found.   Hepatitis C blood testing is recommended for all people born from [birth year] through 1965 and any individual with known risks for hepatitis C.   Practice safe sex. Use condoms and avoid high-risk sexual practices to reduce the spread of sexually transmitted infections (STIs). STIs include gonorrhea, chlamydia, syphilis, trichomonas, herpes, HPV, and human immunodeficiency virus (HIV). Herpes, HIV, and HPV are viral illnesses that have no cure. They can result in disability, cancer, and death.   A one-time screening for abdominal aortic aneurysm (AAA) and surgical repair of large AAAs by sound wave imaging (ultrasonography) is recommended for ages 42 to 47 years who are current or former smokers.   Healthy men should no longer receive prostate-specific antigen (PSA) blood tests as part of routine cancer screening. Consult with your caregiver  about prostate cancer screening.   Testicular cancer screening is not recommended for adult males who have no symptoms. Screening includes self-exam, caregiver exam, and other screening tests. Consult with your caregiver about any symptoms you have or any concerns you have about testicular cancer.   Use sunscreen with skin protection factor (SPF) of 30 or more. Apply sunscreen liberally and repeatedly throughout the day. You should seek shade when your shadow is shorter than you. Protect yourself by wearing long sleeves, pants, a wide-brimmed hat, and sunglasses year round, whenever you are outdoors.   Once a month, do a whole body skin exam, using a mirror to look at the skin on your back. Notify your caregiver of new moles, moles that have irregular borders, moles that are larger than a pencil eraser, or moles that have changed in shape or color.   Stay current with required immunizations.    Influenza. You need a dose every fall (or winter). The composition of the flu vaccine changes each year, so being vaccinated once is not enough.   Pneumococcal polysaccharide. You need 1 to 2 doses if you smoke cigarettes or if you have certain chronic medical conditions. You need 1 dose at age 71 (or older) if you have never been vaccinated.   Tetanus, diphtheria, pertussis (Tdap, Td). Get 1 dose of Tdap vaccine if you are younger than age 73 years, are over 96 and have contact with an infant, are a Research scientist (physical sciences), or simply want to be protected from whooping cough. After that, you need a Td booster dose every 10 years. Consult your caregiver if you have not had at least 3 tetanus and diphtheria-containing shots sometime in your life or have a deep or dirty wound.   HPV. This vaccine is recommended for males 13 through 47 years of age. This vaccine may be given to men 22 through 47 years of age who have not completed the 3 dose series. It is recommended for men through age 18 who have sex with men or whose immune system is weakened because of HIV infection, other illness, or medications. The vaccine is given in 3 doses over 6 months.   Measles, mumps, rubella (MMR). You need at least 1 dose of MMR if you were born in 1957 or later. You may also need a 2nd dose.   Meningococcal. If you are age 51 to 33 years and a Orthoptist living in a residence hall, or have one of several medical conditions, you need to get vaccinated against meningococcal disease. You may also need additional booster doses.   Zoster (shingles). If you are age 52 years or older, you should get this vaccine.   Varicella (chickenpox). If you have never had chickenpox or you were vaccinated but received only 1 dose, talk to your caregiver to find out if you need this vaccine.   Hepatitis A. You need this vaccine if you have a specific risk factor for hepatitis A virus infection, or you simply wish to be  protected from this disease. The vaccine is usually given as 2 doses, 6 to 18 months apart.   Hepatitis B. You need this vaccine if you have a specific risk factor for hepatitis B virus infection or you simply wish to be protected from this disease. The vaccine is given in 3 doses, usually over 6 months.  Preventative Service / Frequency Ages 8 to 72  Blood pressure check.** / Every 1 to 2 years.   Lipid and cholesterol check.** /  Every 5 years beginning at age 41.   Hepatitis C blood test.** / For any individual with known risks for hepatitis C.   Skin self-exam. / Monthly.   Influenza immunization.** / Every year.   Pneumococcal polysaccharide immunization.** / 1 to 2 doses if you smoke cigarettes or if you have certain chronic medical conditions.   Tetanus, diphtheria, pertussis (Tdap,Td) immunization. / A one-time dose of Tdap vaccine. After that, you need a Td booster dose every 10 years.   HPV immunization. / 3 doses over 6 months, if 26 and younger.   Measles, mumps, rubella (MMR) immunization. / You need at least 1 dose of MMR if you were born in 1957 or later. You may also need a 2nd dose.   Meningococcal immunization. / 1 dose if you are age 60 to 41 years and a Orthoptist living in a residence hall, or have one of several medical conditions, you need to get vaccinated against meningococcal disease. You may also need additional booster doses.   Varicella immunization.** / Consult your caregiver.   Hepatitis A immunization.** / Consult your caregiver. 2 doses, 6 to 18 months apart.   Hepatitis B immunization.** / Consult your caregiver. 3 doses usually over 6 months.  Ages 65 to 62  Blood pressure check.** / Every 1 to 2 years.   Lipid and cholesterol check.** / Every 5 years beginning at age 59.   Fecal occult blood test (FOBT) of stool. / Every year beginning at age 58 and continuing until age 47. You may not have to do this test if you get colonoscopy  every 10 years.   Flexible sigmoidoscopy** or colonoscopy.** / Every 5 years for a flexible sigmoidoscopy or every 10 years for a colonoscopy beginning at age 54 and continuing until age 12.   Hepatitis C blood test.** / For all people born from [birth year] through 1965 and any individual with known risks for hepatitis C.   Skin self-exam. / Monthly.   Influenza immunization.** / Every year.   Pneumococcal polysaccharide immunization.** / 1 to 2 doses if you smoke cigarettes or if you have certain chronic medical conditions.   Tetanus, diphtheria, pertussis (Tdap/Td) immunization.** / A one-time dose of Tdap vaccine. After that, you need a Td booster dose every 10 years.   Measles, mumps, rubella (MMR) immunization. / You need at least 1 dose of MMR if you were born in 1957 or later. You may also need a 2nd dose.   Varicella immunization.**/ Consult your caregiver.   Meningococcal immunization.** / Consult your caregiver.   Hepatitis A immunization.** / Consult your caregiver. 2 doses, 6 to 18 months apart.   Hepatitis B immunization.** / Consult your caregiver. 3 doses, usually over 6 months.  Ages 33 and over  Blood pressure check.** / Every 1 to 2 years.   Lipid and cholesterol check.**/ Every 5 years beginning at age 12.   Fecal occult blood test (FOBT) of stool. / Every year beginning at age 68 and continuing until age 68. You may not have to do this test if you get colonoscopy every 10 years.   Flexible sigmoidoscopy** or colonoscopy.** / Every 5 years for a flexible sigmoidoscopy or every 10 years for a colonoscopy beginning at age 13 and continuing until age 105.   Hepatitis C blood test.** / For all people born from [birth year] through 1965 and any individual with known risks for hepatitis C.   Abdominal aortic aneurysm (AAA) screening.** / A one-time screening  for ages 27 to 49 years who are current or former smokers.   Skin self-exam. / Monthly.   Influenza immunization.** / Every  year.   Pneumococcal polysaccharide immunization.** / 1 dose at age 51 (or older) if you have never been vaccinated.   Tetanus, diphtheria, pertussis (Tdap, Td) immunization. / A one-time dose of Tdap vaccine if you are over 65 and have contact with an infant, are a Research scientist (physical sciences), or simply want to be protected from whooping cough. After that, you need a Td booster dose every 10 years.   Varicella immunization. ** / Consult your caregiver.   Meningococcal immunization.** / Consult your caregiver.   Hepatitis A immunization. ** / Consult your caregiver. 2 doses, 6 to 18 months apart.   Hepatitis B immunization.** / Check with your caregiver. 3 doses, usually over 6 months.  **Family history and personal history of risk and conditions may change your caregiver's recommendations. Document Released: 08/10/2001 Document Revised: 06/03/2011 Document Reviewed: 11/09/2010 [facility] Patient Information 2012 [place], [place].

## 2011-09-21 ENCOUNTER — Encounter: Payer: Self-pay | Admitting: Family Medicine

## 2011-09-21 LAB — HEPATIC FUNCTION PANEL
ALT: 30 U/L (ref 0–53)
AST: 24 U/L (ref 0–37)
Albumin: 4.6 g/dL (ref 3.5–5.2)
Alkaline Phosphatase: 97 U/L (ref 39–117)
Bilirubin, Direct: 0.2 mg/dL (ref 0.0–0.3)
Indirect Bilirubin: 1.2 mg/dL — ABNORMAL HIGH (ref 0.0–0.9)
Total Bilirubin: 1.4 mg/dL — ABNORMAL HIGH (ref 0.3–1.2)
Total Protein: 7 g/dL (ref 6.0–8.3)

## 2011-09-21 LAB — TSH: TSH: 3.211 u[IU]/mL (ref 0.350–4.500)

## 2011-09-21 LAB — BASIC METABOLIC PANEL
Calcium: 9.2 mg/dL (ref 8.4–10.5)
Creat: 0.74 mg/dL (ref 0.50–1.35)

## 2011-09-21 LAB — LIPID PANEL
Total CHOL/HDL Ratio: 5.9 Ratio
Triglycerides: 160 mg/dL — ABNORMAL HIGH (ref <150)
VLDL: 32 mg/dL (ref 0–40)

## 2011-09-21 LAB — CBC
HCT: 43 % (ref 39.0–52.0)
Hemoglobin: 14 g/dL (ref 13.0–17.0)
Platelets: 257 10*3/uL (ref 150–400)
RBC: 4.98 MIL/uL (ref 4.22–5.81)

## 2011-09-21 LAB — PHOSPHORUS: Phosphorus: 3.8 mg/dL (ref 2.3–4.6)

## 2011-09-21 NOTE — Telephone Encounter (Signed)
Please advise patients question? Patient would like lab results sent to him in [place]

## 2011-09-22 ENCOUNTER — Encounter: Payer: Self-pay | Admitting: Family Medicine

## 2011-09-22 ENCOUNTER — Other Ambulatory Visit: Payer: BC Managed Care – PPO

## 2011-09-22 DIAGNOSIS — R519 Headache, unspecified: Secondary | ICD-10-CM | POA: Insufficient documentation

## 2011-09-22 DIAGNOSIS — L309 Dermatitis, unspecified: Secondary | ICD-10-CM

## 2011-09-22 DIAGNOSIS — R51 Headache: Secondary | ICD-10-CM

## 2011-09-22 DIAGNOSIS — Z0000 Encounter for general adult medical examination without abnormal findings: Secondary | ICD-10-CM

## 2011-09-22 HISTORY — DX: Headache: R51

## 2011-09-22 HISTORY — DX: Dermatitis, unspecified: L30.9

## 2011-09-22 NOTE — Assessment & Plan Note (Addendum)
Migraine occurs once or twice a month and tension HA intermittently. After discussion it is clear that patient is chronically dehydrated. Drinks very few liquids, encouraged to up his fluid intake, get adequate sleep and continue to use Aleve prn

## 2011-09-22 NOTE — Assessment & Plan Note (Signed)
Doing well encouraged meds as needed, use generously in spring

## 2011-09-22 NOTE — Assessment & Plan Note (Signed)
Eczema vs tinea corporis, rx for Lotrisone to apply bid, repot if symptoms do not improve

## 2011-09-22 NOTE — Progress Notes (Signed)
[name], male   DOB: 05-20-1966, 46 y.o.   MRN: [id number] Michael Gibbs [id number] 09-Oct-1965 09/22/2011      Progress Note New Patient  Subjective  Chief Complaint  Chief Complaint  Patient presents with  . Establish Care    establish, check up    HPI  Patient is a 46 year old male who is in today for new patient appointment. He has had a rash on his right arm for several weeks now which is mildly erythematous and is not had a similar rash previously. Of note his son does have eczema but he feels he's never experienced any. He's not had any recent illness, fevers, chills, chest pain, palpitations, shortness of breath. He has however struggles with headaches for a long time. He gets several headaches a month and is usually able to manage him with some rest and Aleve. Lately his allergies flare with increased congestion and thus increase generalized headaches as well. Previously he did have untreated sleep apnea and eventually with CPAP his headaches improve but now recently worsened. He does maintain a vegetarian diet but he does get complete proteins.  Past Medical History  Diagnosis Date  . Sleep apnea   . Headache   . Allergic state 04/25/2007    Seasonal allergies-well controlled with Allegra and Flonase    . Sleep apnea   . Headache 09/22/2011  . Dermatitis 09/22/2011    History reviewed. No pertinent past surgical history.  Family History  Problem Relation Age of Onset  . Diabetes Mother     diet controlled DM  . Other Father     bowel obstruction with resection  . Cancer Paternal Grandmother     esophageal cancer  . GER disease Paternal Grandmother   . Depression Brother     History   Social History  . Marital Status: Married    Spouse Name: N/A    Number of Children: N/A  . Years of Education: N/A   Occupational History  . Not on file.   Social History Main Topics  . Smoking status: Never Smoker   . Smokeless tobacco:  Never Used  . Alcohol Use: Yes     rare beer  . Drug Use: No  . Sexually Active: Yes   Other Topics Concern  . Not on file   Social History Narrative   Patient is from [place] but lives in [place] 2002, has been in the [place] since 1996, lives with wife and 2 children-ages 9 and 7, wife works from home, her office is in [place]. Remainder family lives in [place]. Patient works in Consulting civil engineer. Enjoys tennis. Vegetarian.    No current outpatient prescriptions on file prior to visit.    No Known Allergies  Review of Systems  Review of Systems  Constitutional: Negative for fever, chills and malaise/fatigue.  HENT: Negative for hearing loss, nosebleeds and congestion.   Eyes: Negative for discharge.  Respiratory: Negative for cough, sputum production, shortness of breath and wheezing.   Cardiovascular: Negative for chest pain, palpitations and leg swelling.  Gastrointestinal: Negative for heartburn, nausea, vomiting, abdominal pain, diarrhea, constipation and blood in stool.  Genitourinary: Negative for dysuria, urgency, frequency and hematuria.  Musculoskeletal: Negative for myalgias, back pain and falls.  Skin: Positive for rash. Negative for itching.       Raised, circular scaly lesion on right arm for several weeks.   Neurological: Negative for dizziness, tremors, sensory change, focal weakness, loss of consciousness, weakness and headaches.  Endo/Heme/Allergies: Negative for polydipsia. Does not bruise/bleed easily.  Psychiatric/Behavioral: Negative for depression and suicidal ideas. The patient is not nervous/anxious and does not have insomnia.     Objective  BP 116/78  Pulse 68  Temp(Src) 98 F (36.7 C) (Temporal)  Ht 5\' 9"  (1.753 m)  Wt 180 lb (81.647 kg)  BMI 26.58 kg/m2  SpO2 99%  Physical Exam  Physical Exam  Constitutional: He is oriented to person, place, and time and well-developed, well-nourished, and in no distress. No distress.  HENT:  Head:  Normocephalic and atraumatic.  Eyes: Conjunctivae are normal.  Neck: Neck supple. No thyromegaly present.  Cardiovascular: Normal rate, regular rhythm and normal heart sounds.   No murmur heard. Pulmonary/Chest: Effort normal and breath sounds normal. No respiratory distress.  Abdominal: He exhibits no distension and no mass. There is no tenderness.  Musculoskeletal: He exhibits no edema.  Neurological: He is alert and oriented to person, place, and time.  Skin: Skin is warm. Rash noted.       1-2 cm raised, oval shaped lesion on right arm, scaly, erythematous  Psychiatric: Memory, affect and judgment normal.       Assessment & Plan  Headache Migraine occurs once or twice a month and tension HA intermittently. After discussion it is clear that patient is chronically dehydrated. Drinks very few liquids, encouraged to up his fluid intake, get adequate sleep and continue to use Aleve prn  Dermatitis Eczema vs tinea corporis, rx for Lotrisone to apply bid, repot if symptoms do not improve  Health counseling Encouraged heart healthy diet, increase exercise monitor fasting labs.  Allergic state Doing well encouraged meds as needed, use generously in spring

## 2011-09-22 NOTE — Assessment & Plan Note (Signed)
Encouraged heart healthy diet, increase exercise monitor fasting labs.

## 2012-08-12 ENCOUNTER — Other Ambulatory Visit: Payer: Self-pay

## 2012-09-15 ENCOUNTER — Other Ambulatory Visit: Payer: BC Managed Care – PPO

## 2012-09-21 ENCOUNTER — Other Ambulatory Visit (INDEPENDENT_AMBULATORY_CARE_PROVIDER_SITE_OTHER): Payer: BC Managed Care – PPO

## 2012-09-21 DIAGNOSIS — Z0000 Encounter for general adult medical examination without abnormal findings: Secondary | ICD-10-CM

## 2012-09-21 LAB — RENAL FUNCTION PANEL
BUN: 11 mg/dL (ref 6–23)
Calcium: 8.9 mg/dL (ref 8.4–10.5)
GFR: 102.36 mL/min (ref >60.00)
Sodium: 139 mEq/L (ref 135–145)

## 2012-09-21 LAB — LIPID PANEL
LDL Cholesterol: 122 mg/dL — ABNORMAL HIGH (ref 0–99)
Triglycerides: 123 mg/dL (ref 0.0–149.0)

## 2012-09-21 LAB — CBC
HCT: 39.5 % (ref 39.0–52.0)
MCHC: 33.6 g/dL (ref 30.0–36.0)
RDW: 13.7 % (ref 11.5–14.6)
WBC: 5.9 10*3/uL (ref 4.5–10.5)

## 2012-09-21 LAB — HEPATIC FUNCTION PANEL
ALT: 17 U/L (ref 0–53)
Alkaline Phosphatase: 79 U/L (ref 39–117)
Bilirubin, Direct: 0.2 mg/dL (ref 0.0–0.3)
Total Bilirubin: 1.4 mg/dL — ABNORMAL HIGH (ref 0.3–1.2)
Total Protein: 6.9 g/dL (ref 6.0–8.3)

## 2012-09-21 NOTE — Progress Notes (Unsigned)
Labs only

## 2012-09-22 ENCOUNTER — Encounter: Payer: Self-pay | Admitting: Family Medicine

## 2012-09-22 ENCOUNTER — Ambulatory Visit (INDEPENDENT_AMBULATORY_CARE_PROVIDER_SITE_OTHER): Payer: BC Managed Care – PPO | Admitting: Family Medicine

## 2012-09-22 VITALS — BP 120/76 | HR 63 | Temp 97.7°F | Ht 68.5 in | Wt 184.0 lb

## 2012-09-22 DIAGNOSIS — Z5189 Encounter for other specified aftercare: Secondary | ICD-10-CM

## 2012-09-22 DIAGNOSIS — T7840XD Allergy, unspecified, subsequent encounter: Secondary | ICD-10-CM

## 2012-09-22 DIAGNOSIS — G4733 Obstructive sleep apnea (adult) (pediatric): Secondary | ICD-10-CM

## 2012-09-22 DIAGNOSIS — Z0000 Encounter for general adult medical examination without abnormal findings: Secondary | ICD-10-CM

## 2012-09-22 NOTE — Patient Instructions (Addendum)
Consider MegaRed caps daily by Schiff Probiotics daily such Digestive Advantage by Schiff  Cholesterol Cholesterol is a white, waxy, fat-like protein needed by your body in small amounts. The liver makes all the cholesterol you need. It is carried from the liver by the blood through the blood vessels. Deposits (plaque) may build up on blood vessel walls. This makes the arteries narrower and stiffer. Plaque increases the risk for heart attack and stroke. You cannot feel your cholesterol level even if it is very high. The only way to know is by a blood test to check your lipid (fats) levels. Once you know your cholesterol levels, you should keep a record of the test results. Work with your caregiver to to keep your levels in the desired range. WHAT THE RESULTS MEAN:  Total cholesterol is a rough measure of all the cholesterol in your blood.  LDL is the so-called bad cholesterol. This is the type that deposits cholesterol in the walls of the arteries. You want this level to be low.  HDL is the good cholesterol because it cleans the arteries and carries the LDL away. You want this level to be high.  Triglycerides are fat that the body can either burn for energy or store. High levels are closely linked to heart disease. DESIRED LEVELS:  Total cholesterol below 200.  LDL below 100 for people at risk, below 70 for very high risk.  HDL above 50 is good, above 60 is best.  Triglycerides below 150. HOW TO LOWER YOUR CHOLESTEROL:  Diet.  Choose fish or white meat chicken and [place], roasted or baked. Limit fatty cuts of red meat, fried foods, and processed meats, such as sausage and lunch meat.  Eat lots of fresh fruits and vegetables. Choose whole grains, beans, pasta, potatoes and cereals.  Use only small amounts of olive, corn or canola oils. Avoid butter, mayonnaise, shortening or palm kernel oils. Avoid foods with trans-fats.  Use skim/nonfat milk and low-fat/nonfat yogurt and cheeses.  Avoid whole milk, cream, ice cream, egg yolks and cheeses. Healthy desserts include angel food cake, ginger snaps, animal crackers, hard candy, popsicles, and low-fat/nonfat frozen yogurt. Avoid pastries, cakes, pies and cookies.  Exercise.  A regular program helps decrease LDL and raises HDL.  Helps with weight control.  Do things that increase your activity level like gardening, walking, or taking the stairs.  Medication.  May be prescribed by your caregiver to help lowering cholesterol and the risk for heart disease.  You may need medicine even if your levels are normal if you have several risk factors. HOME CARE INSTRUCTIONS   Follow your diet and exercise programs as suggested by your caregiver.  Take medications as directed.  Have blood work done when your caregiver feels it is necessary. MAKE SURE YOU:   Understand these instructions.  Will watch your condition.  Will get help right away if you are not doing well or get worse. Document Released: 03/09/2001 Document Revised: 09/06/2011 Document Reviewed: 08/30/2007 [facility] Patient Information 2013 [place], [place].

## 2012-09-24 ENCOUNTER — Encounter: Payer: Self-pay | Admitting: Family Medicine

## 2012-09-24 NOTE — Assessment & Plan Note (Signed)
Using CPAP machine routinely 

## 2012-09-24 NOTE — Progress Notes (Signed)
[name], male   DOB: 03-28-1966, 47 y.o.   MRN: [id number] PAOLA FLYNT [id number] [birth year] 09/24/2012      Progress Note-Follow Up  Subjective  Chief Complaint  Chief Complaint  Patient presents with  . Annual Exam    physical    HPI  Patient is a 47 year old male who is in today for annual exam. Overall he is doing well they have been struggling to get care for his mentally ill sister [facility] and difficult to move back to [place] soon so he believes healthy helpful. He's had a mild flare in his allergies the last day or so but nothing significant. No fevers or chills. No headaches or recent illness. No chest pain, palpitations, shortness of breath, GI or GU complaints. He has not exercising as much as he normally does but he is trying to get started. Past Medical History  Diagnosis Date  . Sleep apnea   . Headache   . Allergic state 04/25/2007    Seasonal allergies-well controlled with Allegra and Flonase    . Sleep apnea   . Headache 09/22/2011  . Dermatitis 09/22/2011  . Preventative health care 04/02/2011    History reviewed. No pertinent past surgical history.  Family History  Problem Relation Age of Onset  . Diabetes Mother     diet controlled DM  . Other Father     bowel obstruction with resection  . Cancer Paternal Grandmother     esophageal cancer  . GER disease Paternal Grandmother   . Depression Brother     History   Social History  . Marital Status: Married    Spouse Name: N/A    Number of Children: N/A  . Years of Education: N/A   Occupational History  . Not on file.   Social History Main Topics  . Smoking status: Never Smoker   . Smokeless tobacco: Never Used  . Alcohol Use: Yes     Comment: rare beer  . Drug Use: No  . Sexually Active: Yes   Other Topics Concern  . Not on file   Social History Narrative   Patient is from [place] but lives in [place], has been in the [place] since 1996, lives  with wife and 2 children-ages 9 and 7, wife works from home, her office is in [place]. Remainder family lives in [place]. Patient works in Consulting civil engineer. Enjoys tennis. Vegetarian.    Current Outpatient Prescriptions on File Prior to Visit  Medication Sig Dispense Refill  . fexofenadine-pseudoephedrine (ALLEGRA-D 12 HOUR) 60-120 MG per tablet Take 1 tablet by mouth 2 (two) times daily as needed.      . naproxen sodium (ALEVE) 220 MG tablet Take 220 mg by mouth 2 (two) times daily as needed.       No current facility-administered medications on file prior to visit.    No Known Allergies  Review of Systems  Review of Systems  Constitutional: Negative for fever and malaise/fatigue.  HENT: Negative for congestion.   Eyes: Negative for discharge.  Respiratory: Negative for shortness of breath.   Cardiovascular: Negative for chest pain, palpitations and leg swelling.  Gastrointestinal: Negative for nausea, abdominal pain and diarrhea.  Genitourinary: Negative for dysuria.  Musculoskeletal: Negative for falls.  Skin: Negative for rash.  Neurological: Negative for loss of consciousness and headaches.  Endo/Heme/Allergies: Negative for polydipsia.  Psychiatric/Behavioral: Negative for depression and suicidal ideas. The patient is not nervous/anxious and does not have insomnia.  Objective  BP 120/76  Pulse 63  Temp(Src) 97.7 F (36.5 C) (Temporal)  Ht 5' 8.5" (1.74 m)  Wt 184 lb (83.462 kg)  BMI 27.57 kg/m2  SpO2 99%  Physical Exam  Physical Exam  Constitutional: He is oriented to person, place, and time and well-developed, well-nourished, and in no distress. No distress.  HENT:  Head: Normocephalic and atraumatic.  Eyes: Conjunctivae are normal.  Neck: Neck supple. No thyromegaly present.  Cardiovascular: Normal rate, regular rhythm and normal heart sounds.   No murmur heard. Pulmonary/Chest: Effort normal and breath sounds normal. No respiratory distress.  Abdominal: He  exhibits no distension and no mass. There is no tenderness.  Musculoskeletal: He exhibits no edema.  Neurological: He is alert and oriented to person, place, and time.  Skin: Skin is warm.  Psychiatric: Memory, affect and judgment normal.    Lab Results  Component Value Date   TSH 3.49 09/21/2012   Lab Results  Component Value Date   WBC 5.9 09/21/2012   HGB 13.3 09/21/2012   HCT 39.5 09/21/2012   MCV 84.2 09/21/2012   PLT 229.0 09/21/2012   Lab Results  Component Value Date   CREATININE 0.9 09/21/2012   BUN 11 09/21/2012   NA 139 09/21/2012   K 5.0 09/21/2012   CL 104 09/21/2012   CO2 27 09/21/2012   Lab Results  Component Value Date   ALT 17 09/21/2012   AST 17 09/21/2012   ALKPHOS 79 09/21/2012   BILITOT 1.4* 09/21/2012   Lab Results  Component Value Date   CHOL 177 09/21/2012   Lab Results  Component Value Date   HDL 30.80* 09/21/2012   Lab Results  Component Value Date   LDLCALC 122* 09/21/2012   Lab Results  Component Value Date   TRIG 123.0 09/21/2012   Lab Results  Component Value Date   CHOLHDL 6 09/21/2012     Assessment & Plan  SLEEP APNEA, OBSTRUCTIVE Using CPAP machine routinely  Allergic state Doing well til today, needs to start back on Antihistamines and can try nasal saline prn  Preventative health care Encouraged DASH diet, regular exercise, seat belt use and adequate sleep, reviewed annual labs with patient and encouraged return in one year or as needed

## 2012-09-24 NOTE — Assessment & Plan Note (Signed)
Doing well til today, needs to start back on Antihistamines and can try nasal saline prn

## 2012-09-24 NOTE — Assessment & Plan Note (Signed)
Encouraged DASH diet, regular exercise, seat belt use and adequate sleep, reviewed annual labs with patient and encouraged return in one year or as needed

## 2012-11-06 ENCOUNTER — Encounter: Payer: Self-pay | Admitting: Family Medicine

## 2012-11-06 MED ORDER — FEXOFENADINE-PSEUDOEPHED ER 60-120 MG PO TB12: 1.0000 | ORAL_TABLET | Freq: Two times a day (BID) | ORAL | Status: DC | PRN

## 2012-11-06 NOTE — Telephone Encounter (Signed)
Please advise the flax seed question? I sent RX for Allegra-D

## 2012-12-26 DIAGNOSIS — G4720 Circadian rhythm sleep disorder, unspecified type: Secondary | ICD-10-CM | POA: Insufficient documentation

## 2013-05-03 ENCOUNTER — Other Ambulatory Visit: Payer: Self-pay

## 2014-08-02 ENCOUNTER — Ambulatory Visit (INDEPENDENT_AMBULATORY_CARE_PROVIDER_SITE_OTHER): Payer: BLUE CROSS/BLUE SHIELD | Admitting: Family Medicine

## 2014-08-02 ENCOUNTER — Encounter: Payer: Self-pay | Admitting: Family Medicine

## 2014-08-02 VITALS — BP 109/72 | HR 71 | Temp 97.8°F | Ht 69.0 in | Wt 179.8 lb

## 2014-08-02 DIAGNOSIS — Z0000 Encounter for general adult medical examination without abnormal findings: Secondary | ICD-10-CM

## 2014-08-02 DIAGNOSIS — G4733 Obstructive sleep apnea (adult) (pediatric): Secondary | ICD-10-CM

## 2014-08-02 DIAGNOSIS — E782 Mixed hyperlipidemia: Secondary | ICD-10-CM

## 2014-08-02 LAB — COMPREHENSIVE METABOLIC PANEL
ALBUMIN: 4.2 g/dL (ref 3.5–5.2)
ALK PHOS: 97 U/L (ref 39–117)
ALT: 18 U/L (ref 0–53)
AST: 17 U/L (ref 0–37)
BUN: 11 mg/dL (ref 6–23)
CALCIUM: 9.1 mg/dL (ref 8.4–10.5)
CHLORIDE: 105 meq/L (ref 96–112)
CO2: 30 mEq/L (ref 19–32)
Creatinine, Ser: 0.9 mg/dL (ref 0.40–1.50)
GFR: 95.09 mL/min (ref >60.00)
Glucose, Bld: 90 mg/dL (ref 70–99)
POTASSIUM: 4.1 meq/L (ref 3.5–5.1)
Sodium: 139 mEq/L (ref 135–145)
Total Bilirubin: 1.2 mg/dL (ref 0.2–1.2)
Total Protein: 7.3 g/dL (ref 6.0–8.3)

## 2014-08-02 LAB — CBC
HCT: 41.4 % (ref 39.0–52.0)
Hemoglobin: 14.1 g/dL (ref 13.0–17.0)
MCHC: 34 g/dL (ref 30.0–36.0)
MCV: 82.5 fl (ref 78.0–100.0)
PLATELETS: 264 10*3/uL (ref 150.0–400.0)
RBC: 5.02 Mil/uL (ref 4.22–5.81)
RDW: 13.3 % (ref 11.5–15.5)
WBC: 7.9 10*3/uL (ref 4.0–10.5)

## 2014-08-02 LAB — TSH: TSH: 3.38 u[IU]/mL (ref 0.35–4.50)

## 2014-08-02 LAB — LIPID PANEL
CHOL/HDL RATIO: 5
Cholesterol: 192 mg/dL (ref 0–200)
HDL: 39.4 mg/dL (ref >39.00)
LDL CALC: 126 mg/dL — AB (ref 0–99)
NONHDL: 152.6
Triglycerides: 132 mg/dL (ref 0.0–149.0)
VLDL: 26.4 mg/dL (ref 0.0–40.0)

## 2014-08-02 NOTE — Patient Instructions (Signed)
Check name of practice that performed sleep study then sign Rel of Rec for test  Preventive Care for Adults A healthy lifestyle and preventive care can promote health and wellness. Preventive health guidelines for men include the following key practices:  A routine yearly physical is a good way to check with your health care provider about your health and preventative screening. It is a chance to share any concerns and updates on your health and to receive a thorough exam.  Visit your dentist for a routine exam and preventative care every 6 months. Brush your teeth twice a day and floss once a day. Good oral hygiene prevents tooth decay and gum disease.  The frequency of eye exams is based on your age, health, family medical history, use of contact lenses, and other factors. Follow your health care provider's recommendations for frequency of eye exams.  Eat a healthy diet. Foods such as vegetables, fruits, whole grains, low-fat dairy products, and lean protein foods contain the nutrients you need without too many calories. Decrease your intake of foods high in solid fats, added sugars, and salt. Eat the right amount of calories for you.Get information about a proper diet from your health care provider, if necessary.  Regular physical exercise is one of the most important things you can do for your health. Most adults should get at least 150 minutes of moderate-intensity exercise (any activity that increases your heart rate and causes you to sweat) each week. In addition, most adults need muscle-strengthening exercises on 2 or more days a week.  Maintain a healthy weight. The body mass index (BMI) is a screening tool to identify possible weight problems. It provides an estimate of body fat based on height and weight. Your health care provider can find your BMI and can help you achieve or maintain a healthy weight.For adults 20 years and older:  A BMI below 18.5 is considered underweight.  A BMI  of 18.5 to 24.9 is normal.  A BMI of 25 to 29.9 is considered overweight.  A BMI of 30 and above is considered obese.  Maintain normal blood lipids and cholesterol levels by exercising and minimizing your intake of saturated fat. Eat a balanced diet with plenty of fruit and vegetables. Blood tests for lipids and cholesterol should begin at age 35 and be repeated every 5 years. If your lipid or cholesterol levels are high, you are over 50, or you are at high risk for heart disease, you may need your cholesterol levels checked more frequently.Ongoing high lipid and cholesterol levels should be treated with medicines if diet and exercise are not working.  If you smoke, find out from your health care provider how to quit. If you do not use tobacco, do not start.  Lung cancer screening is recommended for adults aged 55-80 years who are at high risk for developing lung cancer because of a history of smoking. A yearly low-dose CT scan of the lungs is recommended for people who have at least a 30-pack-year history of smoking and are a current smoker or have quit within the past 15 years. A pack year of smoking is smoking an average of 1 pack of cigarettes a day for 1 year (for example: 1 pack a day for 30 years or 2 packs a day for 15 years). Yearly screening should continue until the smoker has stopped smoking for at least 15 years. Yearly screening should be stopped for people who develop a health problem that would prevent them from  having lung cancer treatment.  If you choose to drink alcohol, do not have more than 2 drinks per day. One drink is considered to be 12 ounces (355 mL) of beer, 5 ounces (148 mL) of wine, or 1.5 ounces (44 mL) of liquor.  Avoid use of street drugs. Do not share needles with anyone. Ask for help if you need support or instructions about stopping the use of drugs.  High blood pressure causes heart disease and increases the risk of stroke. Your blood pressure should be checked  at least every 1-2 years. Ongoing high blood pressure should be treated with medicines, if weight loss and exercise are not effective.  If you are 77-12 years old, ask your health care provider if you should take aspirin to prevent heart disease.  Diabetes screening involves taking a blood sample to check your fasting blood sugar level. This should be done once every 3 years, after age 109, if you are within normal weight and without risk factors for diabetes. Testing should be considered at a younger age or be carried out more frequently if you are overweight and have at least 1 risk factor for diabetes.  Colorectal cancer can be detected and often prevented. Most routine colorectal cancer screening begins at the age of 70 and continues through age 59. However, your health care provider may recommend screening at an earlier age if you have risk factors for colon cancer. On a yearly basis, your health care provider may provide home test kits to check for hidden blood in the stool. Use of a small camera at the end of a tube to directly examine the colon (sigmoidoscopy or colonoscopy) can detect the earliest forms of colorectal cancer. Talk to your health care provider about this at age 32, when routine screening begins. Direct exam of the colon should be repeated every 5-10 years through age 17, unless early forms of precancerous polyps or small growths are found.  People who are at an increased risk for hepatitis B should be screened for this virus. You are considered at high risk for hepatitis B if:  You were born in a country where hepatitis B occurs often. Talk with your health care provider about which countries are considered high risk.  Your parents were born in a high-risk country and you have not received a shot to protect against hepatitis B (hepatitis B vaccine).  You have HIV or AIDS.  You use needles to inject street drugs.  You live with, or have sex with, someone who has hepatitis  B.  You are a man who has sex with other men (MSM).  You get hemodialysis treatment.  You take certain medicines for conditions such as cancer, organ transplantation, and autoimmune conditions.  Hepatitis C blood testing is recommended for all people born from [birth year] through 1965 and any individual with known risks for hepatitis C.  Practice safe sex. Use condoms and avoid high-risk sexual practices to reduce the spread of sexually transmitted infections (STIs). STIs include gonorrhea, chlamydia, syphilis, trichomonas, herpes, HPV, and human immunodeficiency virus (HIV). Herpes, HIV, and HPV are viral illnesses that have no cure. They can result in disability, cancer, and death.  If you are at risk of being infected with HIV, it is recommended that you take a prescription medicine daily to prevent HIV infection. This is called preexposure prophylaxis (PrEP). You are considered at risk if:  You are a man who has sex with other men (MSM) and have other risk factors.  You are a heterosexual man, are sexually active, and are at increased risk for HIV infection.  You take drugs by injection.  You are sexually active with a partner who has HIV.  Talk with your health care provider about whether you are at high risk of being infected with HIV. If you choose to begin PrEP, you should first be tested for HIV. You should then be tested every 3 months for as long as you are taking PrEP.  A one-time screening for abdominal aortic aneurysm (AAA) and surgical repair of large AAAs by ultrasound are recommended for men ages 70 to 37 years who are current or former smokers.  Healthy men should no longer receive prostate-specific antigen (PSA) blood tests as part of routine cancer screening. Talk with your health care provider about prostate cancer screening.  Testicular cancer screening is not recommended for adult males who have no symptoms. Screening includes self-exam, a health care provider exam, and  other screening tests. Consult with your health care provider about any symptoms you have or any concerns you have about testicular cancer.  Use sunscreen. Apply sunscreen liberally and repeatedly throughout the day. You should seek shade when your shadow is shorter than you. Protect yourself by wearing long sleeves, pants, a wide-brimmed hat, and sunglasses year round, whenever you are outdoors.  Once a month, do a whole-body skin exam, using a mirror to look at the skin on your back. Tell your health care provider about new moles, moles that have irregular borders, moles that are larger than a pencil eraser, or moles that have changed in shape or color.  Stay current with required vaccines (immunizations).  Influenza vaccine. All adults should be immunized every year.  Tetanus, diphtheria, and acellular pertussis (Td, Tdap) vaccine. An adult who has not previously received Tdap or who does not know his vaccine status should receive 1 dose of Tdap. This initial dose should be followed by tetanus and diphtheria toxoids (Td) booster doses every 10 years. Adults with an unknown or incomplete history of completing a 3-dose immunization series with Td-containing vaccines should begin or complete a primary immunization series including a Tdap dose. Adults should receive a Td booster every 10 years.  Varicella vaccine. An adult without evidence of immunity to varicella should receive 2 doses or a second dose if he has previously received 1 dose.  Human papillomavirus (HPV) vaccine. Males aged 1-21 years who have not received the vaccine previously should receive the 3-dose series. Males aged 22-26 years may be immunized. Immunization is recommended through the age of 32 years for any male who has sex with males and did not get any or all doses earlier. Immunization is recommended for any person with an immunocompromised condition through the age of 58 years if he did not get any or all doses earlier. During  the 3-dose series, the second dose should be obtained 4-8 weeks after the first dose. The third dose should be obtained 24 weeks after the first dose and 16 weeks after the second dose.  Zoster vaccine. One dose is recommended for adults aged 82 years or older unless certain conditions are present.  Measles, mumps, and rubella (MMR) vaccine. Adults born before 30 generally are considered immune to measles and mumps. Adults born in [birth year] or later should have 1 or more doses of MMR vaccine unless there is a contraindication to the vaccine or there is laboratory evidence of immunity to each of the three diseases. A routine second dose of  MMR vaccine should be obtained at least 28 days after the first dose for students attending postsecondary schools, health care workers, or international travelers. People who received inactivated measles vaccine or an unknown type of measles vaccine during 1963-1967 should receive 2 doses of MMR vaccine. People who received inactivated mumps vaccine or an unknown type of mumps vaccine before 1979 and are at high risk for mumps infection should consider immunization with 2 doses of MMR vaccine. Unvaccinated health care workers born before [birth year] who lack laboratory evidence of measles, mumps, or rubella immunity or laboratory confirmation of disease should consider measles and mumps immunization with 2 doses of MMR vaccine or rubella immunization with 1 dose of MMR vaccine.  Pneumococcal 13-valent conjugate (PCV13) vaccine. When indicated, a person who is uncertain of his immunization history and has no record of immunization should receive the PCV13 vaccine. An adult aged 94 years or older who has certain medical conditions and has not been previously immunized should receive 1 dose of PCV13 vaccine. This PCV13 should be followed with a dose of pneumococcal polysaccharide (PPSV23) vaccine. The PPSV23 vaccine dose should be obtained at least 8 weeks after the dose of PCV13 vaccine.  An adult aged 66 years or older who has certain medical conditions and previously received 1 or more doses of PPSV23 vaccine should receive 1 dose of PCV13. The PCV13 vaccine dose should be obtained 1 or more years after the last PPSV23 vaccine dose.  Pneumococcal polysaccharide (PPSV23) vaccine. When PCV13 is also indicated, PCV13 should be obtained first. All adults aged 11 years and older should be immunized. An adult younger than age 83 years who has certain medical conditions should be immunized. Any person who resides in a nursing home or long-term care facility should be immunized. An adult smoker should be immunized. People with an immunocompromised condition and certain other conditions should receive both PCV13 and PPSV23 vaccines. People with human immunodeficiency virus (HIV) infection should be immunized as soon as possible after diagnosis. Immunization during chemotherapy or radiation therapy should be avoided. Routine use of PPSV23 vaccine is not recommended for American Indians, [place] Natives, or people younger than 65 years unless there are medical conditions that require PPSV23 vaccine. When indicated, people who have unknown immunization and have no record of immunization should receive PPSV23 vaccine. One-time revaccination 5 years after the first dose of PPSV23 is recommended for people aged 19-64 years who have chronic kidney failure, nephrotic syndrome, asplenia, or immunocompromised conditions. People who received 1-2 doses of PPSV23 before age 51 years should receive another dose of PPSV23 vaccine at age 38 years or later if at least 5 years have passed since the previous dose. Doses of PPSV23 are not needed for people immunized with PPSV23 at or after age 54 years.  Meningococcal vaccine. Adults with asplenia or persistent complement component deficiencies should receive 2 doses of quadrivalent meningococcal conjugate (MenACWY-D) vaccine. The doses should be obtained at least 2 months  apart. Microbiologists working with certain meningococcal bacteria, [place] recruits, people at risk during an outbreak, and people who travel to or live in countries with a high rate of meningitis should be immunized. A first-year college student up through age 33 years who is living in a residence hall should receive a dose if he did not receive a dose on or after his 16th birthday. Adults who have certain high-risk conditions should receive one or more doses of vaccine.  Hepatitis A vaccine. Adults who wish to be protected from this  disease, have certain high-risk conditions, work with hepatitis A-infected animals, work in hepatitis A research labs, or travel to or work in countries with a high rate of hepatitis A should be immunized. Adults who were previously unvaccinated and who anticipate close contact with an international adoptee during the first 60 days after arrival in the [place] from a country with a high rate of hepatitis A should be immunized.  Hepatitis B vaccine. Adults should be immunized if they wish to be protected from this disease, have certain high-risk conditions, may be exposed to blood or other infectious body fluids, are household contacts or sex partners of hepatitis B positive people, are clients or workers in certain care facilities, or travel to or work in countries with a high rate of hepatitis B.  Haemophilus influenzae type b (Hib) vaccine. A previously unvaccinated person with asplenia or sickle cell disease or having a scheduled splenectomy should receive 1 dose of Hib vaccine. Regardless of previous immunization, a recipient of a hematopoietic stem cell transplant should receive a 3-dose series 6-12 months after his successful transplant. Hib vaccine is not recommended for adults with HIV infection. Preventive Service / Frequency Ages 33 to 91  Blood pressure check.** / Every 1 to 2 years.  Lipid and cholesterol check.** / Every 5 years beginning at age  22.  Hepatitis C blood test.** / For any individual with known risks for hepatitis C.  Skin self-exam. / Monthly.  Influenza vaccine. / Every year.  Tetanus, diphtheria, and acellular pertussis (Tdap, Td) vaccine.** / Consult your health care provider. 1 dose of Td every 10 years.  Varicella vaccine.** / Consult your health care provider.  HPV vaccine. / 3 doses over 6 months, if 4 or younger.  Measles, mumps, rubella (MMR) vaccine.** / You need at least 1 dose of MMR if you were born in 1957 or later. You may also need a second dose.  Pneumococcal 13-valent conjugate (PCV13) vaccine.** / Consult your health care provider.  Pneumococcal polysaccharide (PPSV23) vaccine.** / 1 to 2 doses if you smoke cigarettes or if you have certain conditions.  Meningococcal vaccine.** / 1 dose if you are age 53 to 60 years and a Market researcher living in a residence hall, or have one of several medical conditions. You may also need additional booster doses.  Hepatitis A vaccine.** / Consult your health care provider.  Hepatitis B vaccine.** / Consult your health care provider.  Haemophilus influenzae type b (Hib) vaccine.** / Consult your health care provider. Ages 79 to 66  Blood pressure check.** / Every 1 to 2 years.  Lipid and cholesterol check.** / Every 5 years beginning at age 36.  Lung cancer screening. / Every year if you are aged 27-80 years and have a 30-pack-year history of smoking and currently smoke or have quit within the past 15 years. Yearly screening is stopped once you have quit smoking for at least 15 years or develop a health problem that would prevent you from having lung cancer treatment.  Fecal occult blood test (FOBT) of stool. / Every year beginning at age 53 and continuing until age 77. You may not have to do this test if you get a colonoscopy every 10 years.  Flexible sigmoidoscopy** or colonoscopy.** / Every 5 years for a flexible sigmoidoscopy or every  10 years for a colonoscopy beginning at age 93 and continuing until age 21.  Hepatitis C blood test.** / For all people born from [birth year] through 1965 and any  individual with known risks for hepatitis C.  Skin self-exam. / Monthly.  Influenza vaccine. / Every year.  Tetanus, diphtheria, and acellular pertussis (Tdap/Td) vaccine.** / Consult your health care provider. 1 dose of Td every 10 years.  Varicella vaccine.** / Consult your health care provider.  Zoster vaccine.** / 1 dose for adults aged 35 years or older.  Measles, mumps, rubella (MMR) vaccine.** / You need at least 1 dose of MMR if you were born in 1957 or later. You may also need a second dose.  Pneumococcal 13-valent conjugate (PCV13) vaccine.** / Consult your health care provider.  Pneumococcal polysaccharide (PPSV23) vaccine.** / 1 to 2 doses if you smoke cigarettes or if you have certain conditions.  Meningococcal vaccine.** / Consult your health care provider.  Hepatitis A vaccine.** / Consult your health care provider.  Hepatitis B vaccine.** / Consult your health care provider.  Haemophilus influenzae type b (Hib) vaccine.** / Consult your health care provider. Ages 73 and over  Blood pressure check.** / Every 1 to 2 years.  Lipid and cholesterol check.**/ Every 5 years beginning at age 51.  Lung cancer screening. / Every year if you are aged 8-80 years and have a 30-pack-year history of smoking and currently smoke or have quit within the past 15 years. Yearly screening is stopped once you have quit smoking for at least 15 years or develop a health problem that would prevent you from having lung cancer treatment.  Fecal occult blood test (FOBT) of stool. / Every year beginning at age 58 and continuing until age 106. You may not have to do this test if you get a colonoscopy every 10 years.  Flexible sigmoidoscopy** or colonoscopy.** / Every 5 years for a flexible sigmoidoscopy or every 10 years for a colonoscopy  beginning at age 70 and continuing until age 44.  Hepatitis C blood test.** / For all people born from [birth year] through 1966 and any individual with known risks for hepatitis C.  Abdominal aortic aneurysm (AAA) screening.** / A one-time screening for ages 62 to 3 years who are current or former smokers.  Skin self-exam. / Monthly.  Influenza vaccine. / Every year.  Tetanus, diphtheria, and acellular pertussis (Tdap/Td) vaccine.** / 1 dose of Td every 10 years.  Varicella vaccine.** / Consult your health care provider.  Zoster vaccine.** / 1 dose for adults aged 56 years or older.  Pneumococcal 13-valent conjugate (PCV13) vaccine.** / Consult your health care provider.  Pneumococcal polysaccharide (PPSV23) vaccine.** / 1 dose for all adults aged 40 years and older.  Meningococcal vaccine.** / Consult your health care provider.  Hepatitis A vaccine.** / Consult your health care provider.  Hepatitis B vaccine.** / Consult your health care provider.  Haemophilus influenzae type b (Hib) vaccine.** / Consult your health care provider. **Family history and personal history of risk and conditions may change your health care provider's recommendations. Document Released: 08/10/2001 Document Revised: 06/19/2013 Document Reviewed: 11/09/2010 [facility] Patient Information 2015 [place], [place]. This information is not intended to replace advice given to you by your health care provider. Make sure you discuss any questions you have with your health care provider.

## 2014-08-02 NOTE — Progress Notes (Signed)
Pre visit review using our clinic review tool, if applicable. No additional management support is needed unless otherwise documented below in the visit note. 

## 2014-08-11 ENCOUNTER — Encounter: Payer: Self-pay | Admitting: Family Medicine

## 2014-08-11 DIAGNOSIS — E785 Hyperlipidemia, unspecified: Secondary | ICD-10-CM | POA: Insufficient documentation

## 2014-08-11 DIAGNOSIS — E782 Mixed hyperlipidemia: Secondary | ICD-10-CM

## 2014-08-11 HISTORY — DX: Mixed hyperlipidemia: E78.2

## 2014-08-11 NOTE — Progress Notes (Signed)
Michael Gibbs  [id number] 21-Dec-1965 08/11/2014      Progress Note-Follow Up  Subjective  Chief Complaint  Chief Complaint  Patient presents with  . Annual Exam    fasting    HPI  Patient is a 49 y.o. male in today for routine medical care. Patient is in today for annual exam and feeling fairly well. No recent illness. Is using his sleep machine routinely and feels rested. Allergies flare at times but Allegra-D is helpful. No significant flare at this time. No recent illness.Denies CP/palp/SOB/HA/congestion/fevers/GI or GU c/o. Taking meds as prescribed  Past Medical History  Diagnosis Date  . Sleep apnea   . Headache(784.0)   . Allergic state 04/25/2007    Seasonal allergies-well controlled with Allegra and Flonase    . Sleep apnea   . Headache(784.0) 09/22/2011  . Dermatitis 09/22/2011  . Preventative health care 04/02/2011  . Hyperlipidemia, mixed 08/11/2014    History reviewed. No pertinent past surgical history.  Family History  Problem Relation Age of Onset  . Diabetes Mother     diet controlled DM  . Other Father     bowel obstruction with resection  . Cancer Paternal Grandmother     esophageal cancer  . GER disease Paternal Grandmother   . Depression Brother     History   Social History  . Marital Status: Married    Spouse Name: N/A  . Number of Children: N/A  . Years of Education: N/A   Occupational History  . Not on file.   Social History Main Topics  . Smoking status: Never Smoker   . Smokeless tobacco: Never Used  . Alcohol Use: Yes     Comment: rare beer  . Drug Use: No  . Sexual Activity: Yes   Other Topics Concern  . Not on file   Social History Narrative   Patient is from [place] but lives in [place] 2002, has been in the [place] since 1996, lives with wife and 2 children-ages 9 and 7, wife works from home, her office is in [address]. Remainder family lives in [place]. Patient works in Consulting civil engineer. Enjoys tennis. Vegetarian.     Current Outpatient Prescriptions on File Prior to Visit  Medication Sig Dispense Refill  . fexofenadine-pseudoephedrine (ALLEGRA-D 12 HOUR) 60-120 MG per tablet Take 1 tablet by mouth 2 (two) times daily as needed. 60 tablet 2  . naproxen sodium (ALEVE) 220 MG tablet Take 220 mg by mouth 2 (two) times daily as needed.     No current facility-administered medications on file prior to visit.    No Known Allergies  Review of Systems  Review of Systems  Constitutional: Negative for fever, chills and malaise/fatigue.  HENT: Negative for congestion, hearing loss and nosebleeds.   Eyes: Negative for discharge.  Respiratory: Negative for cough, sputum production, shortness of breath and wheezing.   Cardiovascular: Negative for chest pain, palpitations and leg swelling.  Gastrointestinal: Negative for heartburn, nausea, vomiting, abdominal pain, diarrhea, constipation and blood in stool.  Genitourinary: Negative for dysuria, urgency, frequency and hematuria.  Musculoskeletal: Negative for myalgias, back pain and falls.  Skin: Negative for rash.  Neurological: Negative for dizziness, tremors, sensory change, focal weakness, loss of consciousness, weakness and headaches.  Endo/Heme/Allergies: Negative for polydipsia. Does not bruise/bleed easily.  Psychiatric/Behavioral: Negative for depression and suicidal ideas. The patient is not nervous/anxious and does not have insomnia.     Objective  BP 109/72 mmHg  Pulse 71  Temp(Src) 97.8 F (36.6 C) (  Oral)  Ht 5\' 9"  (1.753 m)  Wt 179 lb 12.8 oz (81.557 kg)  BMI 26.54 kg/m2  SpO2 100%  Physical Exam  Physical Exam  Constitutional: He is oriented to person, place, and time and well-developed, well-nourished, and in no distress. No distress.  HENT:  Head: Normocephalic and atraumatic.  Eyes: Conjunctivae are normal.  Neck: Neck supple. No thyromegaly present.  Cardiovascular: Normal rate, regular rhythm and normal heart sounds.   No  murmur heard. Pulmonary/Chest: Effort normal and breath sounds normal. No respiratory distress.  Abdominal: Soft. Bowel sounds are normal. He exhibits no distension and no mass. There is no tenderness.  Musculoskeletal: He exhibits no edema.  Neurological: He is alert and oriented to person, place, and time.  Skin: Skin is warm.  Psychiatric: Memory, affect and judgment normal.    Lab Results  Component Value Date   TSH 3.38 08/02/2014   Lab Results  Component Value Date   WBC 7.9 08/02/2014   HGB 14.1 08/02/2014   HCT 41.4 08/02/2014   MCV 82.5 08/02/2014   PLT 264.0 08/02/2014   Lab Results  Component Value Date   CREATININE 0.90 08/02/2014   BUN 11 08/02/2014   NA 139 08/02/2014   K 4.1 08/02/2014   CL 105 08/02/2014   CO2 30 08/02/2014   Lab Results  Component Value Date   ALT 18 08/02/2014   AST 17 08/02/2014   ALKPHOS 97 08/02/2014   BILITOT 1.2 08/02/2014   Lab Results  Component Value Date   CHOL 192 08/02/2014   Lab Results  Component Value Date   HDL 39.40 08/02/2014   Lab Results  Component Value Date   LDLCALC 126* 08/02/2014   Lab Results  Component Value Date   TRIG 132.0 08/02/2014   Lab Results  Component Value Date   CHOLHDL 5 08/02/2014     Assessment & Plan  Obstructive sleep apnea Uses CPAP routinely, feeling well   Preventative health care Patient encouraged to maintain heart healthy diet, regular exercise, adequate sleep. Consider daily probiotics. Take medications as prescribed. Fasting labs ordered and reviewed today   Hyperlipidemia, mixed Mild, Encouraged heart healthy diet, increase exercise, avoid trans fats, consider a krill oil cap daily

## 2014-08-11 NOTE — Assessment & Plan Note (Signed)
Uses CPAP routinely, feeling well

## 2014-08-11 NOTE — Assessment & Plan Note (Signed)
Patient encouraged to maintain heart healthy diet, regular exercise, adequate sleep. Consider daily probiotics. Take medications as prescribed. Fasting labs ordered and reviewed today

## 2014-08-11 NOTE — Assessment & Plan Note (Signed)
Mild, Encouraged heart healthy diet, increase exercise, avoid trans fats, consider a krill oil cap daily 

## 2015-08-07 ENCOUNTER — Telehealth: Payer: Self-pay | Admitting: Behavioral Health

## 2015-08-07 ENCOUNTER — Encounter: Payer: Self-pay | Admitting: Behavioral Health

## 2015-08-07 NOTE — Telephone Encounter (Signed)
Pre-Visit Call completed with patient and chart updated.   Pre-Visit Info documented in Specialty Comments under [employer].    

## 2015-08-08 ENCOUNTER — Ambulatory Visit (INDEPENDENT_AMBULATORY_CARE_PROVIDER_SITE_OTHER): Payer: BLUE CROSS/BLUE SHIELD | Admitting: Family Medicine

## 2015-08-08 ENCOUNTER — Encounter: Payer: Self-pay | Admitting: Family Medicine

## 2015-08-08 VITALS — BP 112/72 | HR 80 | Temp 98.2°F | Ht 69.0 in | Wt 180.0 lb

## 2015-08-08 DIAGNOSIS — E782 Mixed hyperlipidemia: Secondary | ICD-10-CM

## 2015-08-08 DIAGNOSIS — Z0000 Encounter for general adult medical examination without abnormal findings: Secondary | ICD-10-CM

## 2015-08-08 DIAGNOSIS — G4733 Obstructive sleep apnea (adult) (pediatric): Secondary | ICD-10-CM

## 2015-08-08 LAB — LIPID PANEL
CHOL/HDL RATIO: 6
CHOLESTEROL: 177 mg/dL (ref 0–200)
HDL: 32.1 mg/dL — ABNORMAL LOW (ref >39.00)
LDL CALC: 108 mg/dL — AB (ref 0–99)
NONHDL: 144.57
Triglycerides: 184 mg/dL — ABNORMAL HIGH (ref 0.0–149.0)
VLDL: 36.8 mg/dL (ref 0.0–40.0)

## 2015-08-08 LAB — COMPREHENSIVE METABOLIC PANEL
ALBUMIN: 4.4 g/dL (ref 3.5–5.2)
ALT: 17 U/L (ref 0–53)
AST: 15 U/L (ref 0–37)
Alkaline Phosphatase: 95 U/L (ref 39–117)
BUN: 11 mg/dL (ref 6–23)
CHLORIDE: 104 meq/L (ref 96–112)
CO2: 31 meq/L (ref 19–32)
Calcium: 9.5 mg/dL (ref 8.4–10.5)
Creatinine, Ser: 0.8 mg/dL (ref 0.40–1.50)
GFR: 108.48 mL/min (ref >60.00)
Glucose, Bld: 89 mg/dL (ref 70–99)
POTASSIUM: 3.9 meq/L (ref 3.5–5.1)
SODIUM: 141 meq/L (ref 135–145)
Total Bilirubin: 1 mg/dL (ref 0.2–1.2)
Total Protein: 7.8 g/dL (ref 6.0–8.3)

## 2015-08-08 LAB — TSH: TSH: 2.07 u[IU]/mL (ref 0.35–4.50)

## 2015-08-08 NOTE — Progress Notes (Signed)
Subjective:    [name], male    DOB: Apr 28, 1966, 50 y.o.   MRN: [id number]  Chief Complaint  Patient presents with  . Annual Exam    HPI Patient is in today for annual exam. He is doing well. He uses his CPAP nightly but wakes up every night around 2 am for the past couple of months. No recent increased stressors or other changes in lifestyle he feels can explain his change in sleep. No other acute complaints or recent illness. Denies CP/palp/SOB/HA/congestion/fevers/GI or GU c/o. Taking meds as prescribed  Past Medical History  Diagnosis Date  . Sleep apnea   . Headache(784.0)   . Allergic state 04/25/2007    Seasonal allergies-well controlled with Allegra and Flonase    . Sleep apnea   . Headache(784.0) 09/22/2011  . Dermatitis 09/22/2011  . Preventative health care 04/02/2011  . Hyperlipidemia, mixed 08/11/2014    History reviewed. No pertinent past surgical history.  Family History  Problem Relation Age of Onset  . Diabetes Mother     diet controlled DM  . Other Father     bowel obstruction with resection  . Cancer Paternal Grandmother     esophageal cancer  . GER disease Paternal Grandmother   . Depression Brother     Social History   Social History  . Marital Status: Married    Spouse Name: N/A  . Number of Children: N/A  . Years of Education: N/A   Occupational History  . Not on file.   Social History Main Topics  . Smoking status: Never Smoker   . Smokeless tobacco: Never Used  . Alcohol Use: Yes     Comment: rare beer  . Drug Use: No  . 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 Not on file   Social History Narrative   Patient is from [place] but lives in [place] 2002, has been in the [place] since 1996, lives with wife and 2 children-ages 9 and 7, wife works from home, her office is in [place]. Remainder family lives in [place].  Patient works in Consulting civil engineer. Enjoys tennis. Vegetarian.    Outpatient Prescriptions Prior to Visit  Medication Sig Dispense Refill  . fexofenadine-pseudoephedrine (ALLEGRA-D 12 HOUR) 60-120 MG per tablet Take 1 tablet by mouth 2 (two) times daily as needed. 60 tablet 2  . naproxen sodium (ALEVE) 220 MG tablet Take 220 mg by mouth 2 (two) times daily as needed.     No facility-administered medications prior to visit.    No Known Allergies  Review of Systems  Constitutional: Negative for fever, chills and malaise/fatigue.  HENT: Negative for congestion and hearing loss.   Eyes: Negative for discharge.  Respiratory: Negative for cough, sputum production and shortness of breath.   Cardiovascular: Negative for chest pain, palpitations and leg swelling.  Gastrointestinal: Negative for heartburn, nausea, vomiting, abdominal pain, diarrhea, constipation and blood in stool.  Genitourinary: Negative for dysuria, urgency, frequency and hematuria.  Musculoskeletal: Negative for myalgias, back pain and falls.  Skin: Negative for rash.  Neurological: Negative for dizziness, sensory change, loss of consciousness, weakness and headaches.  Endo/Heme/Allergies: Negative for environmental allergies. Does not bruise/bleed easily.  Psychiatric/Behavioral: Negative for depression and suicidal ideas. The patient has insomnia. The patient is not nervous/anxious.        Objective:    Physical Exam  Constitutional: He  is oriented to person, place, and time. He appears well-developed and well-nourished. No distress.  HENT:  Head: Normocephalic and atraumatic.  Eyes: Conjunctivae are normal.  Neck: Neck supple. No thyromegaly present.  Cardiovascular: Normal rate, regular rhythm and normal heart sounds.   No murmur heard. Pulmonary/Chest: Effort normal and breath sounds normal. No respiratory distress. He has no wheezes.  Abdominal: Soft. Bowel sounds are normal. He exhibits no mass. There is no tenderness.    Musculoskeletal: He exhibits no edema.  Lymphadenopathy:    He has no cervical adenopathy.  Neurological: He is alert and oriented to person, place, and time.  Skin: Skin is warm and dry.  Psychiatric: He has a normal mood and affect. His behavior is normal.    BP 112/72 mmHg  Pulse 80  Temp(Src) 98.2 F (36.8 C) (Oral)  Ht  (1.753 m)  Wt 180 lb (81.647 kg)  BMI 26.57 kg/m2  SpO2 98% Wt Readings from Last 3 Encounters:  08/08/15 180 lb (81.647 kg)  08/02/14 179 lb 12.8 oz (81.557 kg)  09/22/12 184 lb (83.462 kg)     Lab Results  Component Value Date   WBC 6.5 08/08/2015   HGB 13.4 08/08/2015   HCT 43.2 08/08/2015   PLT 288.0 08/08/2015   GLUCOSE 89 08/08/2015   CHOL 177 08/08/2015   TRIG 184.0* 08/08/2015   HDL 32.10* 08/08/2015   LDLDIRECT 110* 04/25/2007   LDLCALC 108* 08/08/2015   ALT 17 08/08/2015   AST 15 08/08/2015   NA 141 08/08/2015   K 3.9 08/08/2015   CL 104 08/08/2015   CREATININE 0.80 08/08/2015   BUN 11 08/08/2015   CO2 31 08/08/2015   TSH 2.07 08/08/2015    Lab Results  Component Value Date   TSH 2.07 08/08/2015   Lab Results  Component Value Date   WBC 6.5 08/08/2015   HGB 13.4 08/08/2015   HCT 43.2 08/08/2015   MCV 86.6 08/08/2015   PLT 288.0 08/08/2015   Lab Results  Component Value Date   NA 141 08/08/2015   K 3.9 08/08/2015   CO2 31 08/08/2015   GLUCOSE 89 08/08/2015   BUN 11 08/08/2015   CREATININE 0.80 08/08/2015   BILITOT 1.0 08/08/2015   ALKPHOS 95 08/08/2015   AST 15 08/08/2015   ALT 17 08/08/2015   PROT 7.8 08/08/2015   ALBUMIN 4.4 08/08/2015   CALCIUM 9.5 08/08/2015   GFR 108.48 08/08/2015   Lab Results  Component Value Date   CHOL 177 08/08/2015   Lab Results  Component Value Date   HDL 32.10* 08/08/2015   Lab Results  Component Value Date   LDLCALC 108* 08/08/2015   Lab Results  Component Value Date   TRIG 184.0* 08/08/2015   Lab Results  Component Value Date   CHOLHDL 6 08/08/2015   No  results found for: HGBA1C     Assessment & Plan:   Problem List Items Addressed This Visit    Hyperlipidemia, mixed    Encouraged heart healthy diet, increase exercise, avoid trans fats, consider a krill oil cap daily      Relevant Orders   CBC (Completed)   TSH (Completed)   Lipid panel (Completed)   Comprehensive metabolic panel (Completed)   Obstructive sleep apnea    Patient continues to struggle with interrupted sleep but uses CPAP regularly. Encouraged good sleep hygiene such as dark, quiet room. No blue/green glowing lights such as computer screens in bedroom. No alcohol or stimulants in evening. Cut  down on caffeine as able. Regular exercise is helpful but not just prior to bed time. Try Melatonin prn      Preventative health care - Primary   Relevant Orders   CBC (Completed)   TSH (Completed)   Lipid panel (Completed)   Comprehensive metabolic panel (Completed)   HIV antibody (with reflex) (Completed)      I am having Mr. Mould maintain his naproxen sodium and fexofenadine-pseudoephedrine.  No orders of the defined types were placed in this encounter.     Danise Edge, MD

## 2015-08-08 NOTE — Progress Notes (Signed)
Pre visit review using our clinic review tool, if applicable. No additional management support is needed unless otherwise documented below in the visit note. 

## 2015-08-08 NOTE — Patient Instructions (Addendum)
Sal[place] Pas and Aspercreme patches or gel  Encouraged good sleep hygiene such as dark, quiet room. No blue/green glowing lights such as computer screens in bedroom. No alcohol or stimulants in evening. Cut down on caffeine as able. Regular exercise is helpful but not just prior to bed time.   Melatonin 2-10 mg at bedtime as needed   Preventive Care for Adults, Male A healthy lifestyle and preventive care can promote health and wellness. Preventive health guidelines for men include the following key practices:  A routine yearly physical is a good way to check with your health care provider about your health and preventative screening. It is a chance to share any concerns and updates on your health and to receive a thorough exam.  Visit your dentist for a routine exam and preventative care every 6 months. Brush your teeth twice a day and floss once a day. Good oral hygiene prevents tooth decay and gum disease.  The frequency of eye exams is based on your age, health, family medical history, use of contact lenses, and other factors. Follow your health care provider's recommendations for frequency of eye exams.  Eat a healthy diet. Foods such as vegetables, fruits, whole grains, low-fat dairy products, and lean protein foods contain the nutrients you need without too many calories. Decrease your intake of foods high in solid fats, added sugars, and salt. Eat the right amount of calories for you.Get information about a proper diet from your health care provider, if necessary.  Regular physical exercise is one of the most important things you can do for your health. Most adults should get at least 150 minutes of moderate-intensity exercise (any activity that increases your heart rate and causes you to sweat) each week. In addition, most adults need muscle-strengthening exercises on 2 or more days a week.  Maintain a healthy weight. The body mass index (BMI) is a screening tool to identify possible  weight problems. It provides an estimate of body fat based on height and weight. Your health care provider can find your BMI and can help you achieve or maintain a healthy weight.For adults 20 years and older:  A BMI below 18.5 is considered underweight.  A BMI of 18.5 to 24.9 is normal.  A BMI of 25 to 29.9 is considered overweight.  A BMI of 30 and above is considered obese.  Maintain normal blood lipids and cholesterol levels by exercising and minimizing your intake of saturated fat. Eat a balanced diet with plenty of fruit and vegetables. Blood tests for lipids and cholesterol should begin at age 62 and be repeated every 5 years. If your lipid or cholesterol levels are high, you are over 50, or you are at high risk for heart disease, you may need your cholesterol levels checked more frequently.Ongoing high lipid and cholesterol levels should be treated with medicines if diet and exercise are not working.  If you smoke, find out from your health care provider how to quit. If you do not use tobacco, do not start.  Lung cancer screening is recommended for adults aged 72-80 years who are at high risk for developing lung cancer because of a history of smoking. A yearly low-dose CT scan of the lungs is recommended for people who have at least a 30-pack-year history of smoking and are a current smoker or have quit within the past 15 years. A pack year of smoking is smoking an average of 1 pack of cigarettes a day for 1 year (for example: 1  pack a day for 30 years or 2 packs a day for 15 years). Yearly screening should continue until the smoker has stopped smoking for at least 15 years. Yearly screening should be stopped for people who develop a health problem that would prevent them from having lung cancer treatment.  If you choose to drink alcohol, do not have more than 2 drinks per day. One drink is considered to be 12 ounces (355 mL) of beer, 5 ounces (148 mL) of wine, or 1.5 ounces (44 mL) of  liquor.  Avoid use of street drugs. Do not share needles with anyone. Ask for help if you need support or instructions about stopping the use of drugs.  High blood pressure causes heart disease and increases the risk of stroke. Your blood pressure should be checked at least every 1-2 years. Ongoing high blood pressure should be treated with medicines, if weight loss and exercise are not effective.  If you are 58-80 years old, ask your health care provider if you should take aspirin to prevent heart disease.  Diabetes screening is done by taking a blood sample to check your blood glucose level after you have not eaten for a certain period of time (fasting). If you are not overweight and you do not have risk factors for diabetes, you should be screened once every 3 years starting at age 70. If you are overweight or obese and you are 33-30 years of age, you should be screened for diabetes every year as part of your cardiovascular risk assessment.  Colorectal cancer can be detected and often prevented. Most routine colorectal cancer screening begins at the age of 71 and continues through age 25. However, your health care provider may recommend screening at an earlier age if you have risk factors for colon cancer. On a yearly basis, your health care provider may provide home test kits to check for hidden blood in the stool. Use of a small camera at the end of a tube to directly examine the colon (sigmoidoscopy or colonoscopy) can detect the earliest forms of colorectal cancer. Talk to your health care provider about this at age 74, when routine screening begins. Direct exam of the colon should be repeated every 5-10 years through age 51, unless early forms of precancerous polyps or small growths are found.  People who are at an increased risk for hepatitis B should be screened for this virus. You are considered at high risk for hepatitis B if:  You were born in a country where hepatitis B occurs often. Talk  with your health care provider about which countries are considered high risk.  Your parents were born in a high-risk country and you have not received a shot to protect against hepatitis B (hepatitis B vaccine).  You have HIV or AIDS.  You use needles to inject street drugs.  You live with, or have sex with, someone who has hepatitis B.  You are a man who has sex with other men (MSM).  You get hemodialysis treatment.  You take certain medicines for conditions such as cancer, organ transplantation, and autoimmune conditions.  Hepatitis C blood testing is recommended for all people born from [birth year] through 1965 and any individual with known risks for hepatitis C.  Practice safe sex. Use condoms and avoid high-risk sexual practices to reduce the spread of sexually transmitted infections (STIs). STIs include gonorrhea, chlamydia, syphilis, trichomonas, herpes, HPV, and human immunodeficiency virus (HIV). Herpes, HIV, and HPV are viral illnesses that have no cure.  They can result in disability, cancer, and death.  If you are a man who has sex with other men, you should be screened at least once per year for:  HIV.  Urethral, rectal, and pharyngeal infection of gonorrhea, chlamydia, or both.  If you are at risk of being infected with HIV, it is recommended that you take a prescription medicine daily to prevent HIV infection. This is called preexposure prophylaxis (PrEP). You are considered at risk if:  You are a man who has sex with other men (MSM) and have other risk factors.  You are a heterosexual man, are sexually active, and are at increased risk for HIV infection.  You take drugs by injection.  You are sexually active with a partner who has HIV.  Talk with your health care provider about whether you are at high risk of being infected with HIV. If you choose to begin PrEP, you should first be tested for HIV. You should then be tested every 3 months for as long as you are taking  PrEP.  A one-time screening for abdominal aortic aneurysm (AAA) and surgical repair of large AAAs by ultrasound are recommended for men ages 31 to 67 years who are current or former smokers.  Healthy men should no longer receive prostate-specific antigen (PSA) blood tests as part of routine cancer screening. Talk with your health care provider about prostate cancer screening.  Testicular cancer screening is not recommended for adult males who have no symptoms. Screening includes self-exam, a health care provider exam, and other screening tests. Consult with your health care provider about any symptoms you have or any concerns you have about testicular cancer.  Use sunscreen. Apply sunscreen liberally and repeatedly throughout the day. You should seek shade when your shadow is shorter than you. Protect yourself by wearing long sleeves, pants, a wide-brimmed hat, and sunglasses year round, whenever you are outdoors.  Once a month, do a whole-body skin exam, using a mirror to look at the skin on your back. Tell your health care provider about new moles, moles that have irregular borders, moles that are larger than a pencil eraser, or moles that have changed in shape or color.  Stay current with required vaccines (immunizations).  Influenza vaccine. All adults should be immunized every year.  Tetanus, diphtheria, and acellular pertussis (Td, Tdap) vaccine. An adult who has not previously received Tdap or who does not know his vaccine status should receive 1 dose of Tdap. This initial dose should be followed by tetanus and diphtheria toxoids (Td) booster doses every 10 years. Adults with an unknown or incomplete history of completing a 3-dose immunization series with Td-containing vaccines should begin or complete a primary immunization series including a Tdap dose. Adults should receive a Td booster every 10 years.  Varicella vaccine. An adult without evidence of immunity to varicella should receive 2  doses or a second dose if he has previously received 1 dose.  Human papillomavirus (HPV) vaccine. Males aged 11-21 years who have not received the vaccine previously should receive the 3-dose series. Males aged 22-26 years may be immunized. Immunization is recommended through the age of 69 years for any male who has sex with males and did not get any or all doses earlier. Immunization is recommended for any person with an immunocompromised condition through the age of 20 years if he did not get any or all doses earlier. During the 3-dose series, the second dose should be obtained 4-8 weeks after the first dose.  The third dose should be obtained 24 weeks after the first dose and 16 weeks after the second dose.  Zoster vaccine. One dose is recommended for adults aged 13 years or older unless certain conditions are present.  Measles, mumps, and rubella (MMR) vaccine. Adults born before [birth year] generally are considered immune to measles and mumps. Adults born in [birth year] or later should have 1 or more doses of MMR vaccine unless there is a contraindication to the vaccine or there is laboratory evidence of immunity to each of the three diseases. A routine second dose of MMR vaccine should be obtained at least 28 days after the first dose for students attending postsecondary schools, health care workers, or international travelers. People who received inactivated measles vaccine or an unknown type of measles vaccine during 1963-1967 should receive 2 doses of MMR vaccine. People who received inactivated mumps vaccine or an unknown type of mumps vaccine before 1979 and are at high risk for mumps infection should consider immunization with 2 doses of MMR vaccine. Unvaccinated health care workers born before [birth year] who lack laboratory evidence of measles, mumps, or rubella immunity or laboratory confirmation of disease should consider measles and mumps immunization with 2 doses of MMR vaccine or rubella immunization with 1 dose  of MMR vaccine.  Pneumococcal 13-valent conjugate (PCV13) vaccine. When indicated, a person who is uncertain of his immunization history and has no record of immunization should receive the PCV13 vaccine. All adults 37 years of age and older should receive this vaccine. An adult aged 88 years or older who has certain medical conditions and has not been previously immunized should receive 1 dose of PCV13 vaccine. This PCV13 should be followed with a dose of pneumococcal polysaccharide (PPSV23) vaccine. Adults who are at high risk for pneumococcal disease should obtain the PPSV23 vaccine at least 8 weeks after the dose of PCV13 vaccine. Adults older than 51 years of age who have normal immune system function should obtain the PPSV23 vaccine dose at least 1 year after the dose of PCV13 vaccine.  Pneumococcal polysaccharide (PPSV23) vaccine. When PCV13 is also indicated, PCV13 should be obtained first. All adults aged 80 years and older should be immunized. An adult younger than age 22 years who has certain medical conditions should be immunized. Any person who resides in a nursing home or long-term care facility should be immunized. An adult smoker should be immunized. People with an immunocompromised condition and certain other conditions should receive both PCV13 and PPSV23 vaccines. People with human immunodeficiency virus (HIV) infection should be immunized as soon as possible after diagnosis. Immunization during chemotherapy or radiation therapy should be avoided. Routine use of PPSV23 vaccine is not recommended for American Indians, [place] Natives, or people younger than 65 years unless there are medical conditions that require PPSV23 vaccine. When indicated, people who have unknown immunization and have no record of immunization should receive PPSV23 vaccine. One-time revaccination 5 years after the first dose of PPSV23 is recommended for people aged 19-64 years who have chronic kidney failure, nephrotic  syndrome, asplenia, or immunocompromised conditions. People who received 1-2 doses of PPSV23 before age 42 years should receive another dose of PPSV23 vaccine at age 93 years or later if at least 5 years have passed since the previous dose. Doses of PPSV23 are not needed for people immunized with PPSV23 at or after age 31 years.  Meningococcal vaccine. Adults with asplenia or persistent complement component deficiencies should receive 2 doses of quadrivalent meningococcal conjugate (MenACWY-D) vaccine.  The doses should be obtained at least 2 months apart. Microbiologists working with certain meningococcal bacteria, [employer] recruits, people at risk during an outbreak, and people who travel to or live in countries with a high rate of meningitis should be immunized. A first-year college student up through age 47 years who is living in a residence hall should receive a dose if he did not receive a dose on or after his 16th birthday. Adults who have certain high-risk conditions should receive one or more doses of vaccine.  Hepatitis A vaccine. Adults who wish to be protected from this disease, have chronic liver disease, work with hepatitis A-infected animals, work in hepatitis A research labs, or travel to or work in countries with a high rate of hepatitis A should be immunized. Adults who were previously unvaccinated and who anticipate close contact with an international adoptee during the first 60 days after arrival in the [place] from a country with a high rate of hepatitis A should be immunized.  Hepatitis B vaccine. Adults should be immunized if they wish to be protected from this disease, are under age 96 years and have diabetes, have chronic liver disease, have had more than one sex partner in the past 6 months, may be exposed to blood or other infectious body fluids, are household contacts or sex partners of hepatitis B positive people, are clients or workers in certain care facilities, or travel to  or work in countries with a high rate of hepatitis B.  Haemophilus influenzae type b (Hib) vaccine. A previously unvaccinated person with asplenia or sickle cell disease or having a scheduled splenectomy should receive 1 dose of Hib vaccine. Regardless of previous immunization, a recipient of a hematopoietic stem cell transplant should receive a 3-dose series 6-12 months after his successful transplant. Hib vaccine is not recommended for adults with HIV infection. Preventive Service / Frequency Ages 101 to 39  Blood pressure check.** / Every 3-5 years.  Lipid and cholesterol check.** / Every 5 years beginning at age 13.  Hepatitis C blood test.** / For any individual with known risks for hepatitis C.  Skin self-exam. / Monthly.  Influenza vaccine. / Every year.  Tetanus, diphtheria, and acellular pertussis (Tdap, Td) vaccine.** / Consult your health care provider. 1 dose of Td every 10 years.  Varicella vaccine.** / Consult your health care provider.  HPV vaccine. / 3 doses over 6 months, if 72 or younger.  Measles, mumps, rubella (MMR) vaccine.** / You need at least 1 dose of MMR if you were born in 1957 or later. You may also need a second dose.  Pneumococcal 13-valent conjugate (PCV13) vaccine.** / Consult your health care provider.  Pneumococcal polysaccharide (PPSV23) vaccine.** / 1 to 2 doses if you smoke cigarettes or if you have certain conditions.  Meningococcal vaccine.** / 1 dose if you are age 66 to 11 years and a Market researcher living in a residence hall, or have one of several medical conditions. You may also need additional booster doses.  Hepatitis A vaccine.** / Consult your health care provider.  Hepatitis B vaccine.** / Consult your health care provider.  Haemophilus influenzae type b (Hib) vaccine.** / Consult your health care provider. Ages 85 to 15  Blood pressure check.** / Every year.  Lipid and cholesterol check.** / Every 5 years beginning  at age 65.  Lung cancer screening. / Every year if you are aged 40-80 years and have a 30-pack-year history of smoking and currently smoke or  have quit within the past 15 years. Yearly screening is stopped once you have quit smoking for at least 15 years or develop a health problem that would prevent you from having lung cancer treatment.  Fecal occult blood test (FOBT) of stool. / Every year beginning at age 31 and continuing until age 4. You may not have to do this test if you get a colonoscopy every 10 years.  Flexible sigmoidoscopy** or colonoscopy.** / Every 5 years for a flexible sigmoidoscopy or every 10 years for a colonoscopy beginning at age 68 and continuing until age 26.  Hepatitis C blood test.** / For all people born from [birth year] through 1965 and any individual with known risks for hepatitis C.  Skin self-exam. / Monthly.  Influenza vaccine. / Every year.  Tetanus, diphtheria, and acellular pertussis (Tdap/Td) vaccine.** / Consult your health care provider. 1 dose of Td every 10 years.  Varicella vaccine.** / Consult your health care provider.  Zoster vaccine.** / 1 dose for adults aged 79 years or older.  Measles, mumps, rubella (MMR) vaccine.** / You need at least 1 dose of MMR if you were born in 1957 or later. You may also need a second dose.  Pneumococcal 13-valent conjugate (PCV13) vaccine.** / Consult your health care provider.  Pneumococcal polysaccharide (PPSV23) vaccine.** / 1 to 2 doses if you smoke cigarettes or if you have certain conditions.  Meningococcal vaccine.** / Consult your health care provider.  Hepatitis A vaccine.** / Consult your health care provider.  Hepatitis B vaccine.** / Consult your health care provider.  Haemophilus influenzae type b (Hib) vaccine.** / Consult your health care provider. Ages 90 and over  Blood pressure check.** / Every year.  Lipid and cholesterol check.**/ Every 5 years beginning at age 27.  Lung cancer screening.  / Every year if you are aged 15-80 years and have a 30-pack-year history of smoking and currently smoke or have quit within the past 15 years. Yearly screening is stopped once you have quit smoking for at least 15 years or develop a health problem that would prevent you from having lung cancer treatment.  Fecal occult blood test (FOBT) of stool. / Every year beginning at age 22 and continuing until age 80. You may not have to do this test if you get a colonoscopy every 10 years.  Flexible sigmoidoscopy** or colonoscopy.** / Every 5 years for a flexible sigmoidoscopy or every 10 years for a colonoscopy beginning at age 71 and continuing until age 46.  Hepatitis C blood test.** / For all people born from [birth year] through 1966 and any individual with known risks for hepatitis C.  Abdominal aortic aneurysm (AAA) screening.** / A one-time screening for ages 44 to 62 years who are current or former smokers.  Skin self-exam. / Monthly.  Influenza vaccine. / Every year.  Tetanus, diphtheria, and acellular pertussis (Tdap/Td) vaccine.** / 1 dose of Td every 10 years.  Varicella vaccine.** / Consult your health care provider.  Zoster vaccine.** / 1 dose for adults aged 70 years or older.  Pneumococcal 13-valent conjugate (PCV13) vaccine.** / 1 dose for all adults aged 69 years and older.  Pneumococcal polysaccharide (PPSV23) vaccine.** / 1 dose for all adults aged 79 years and older.  Meningococcal vaccine.** / Consult your health care provider.  Hepatitis A vaccine.** / Consult your health care provider.  Hepatitis B vaccine.** / Consult your health care provider.  Haemophilus influenzae type b (Hib) vaccine.** / Consult your health care provider. **Family history  and personal history of risk and conditions may change your health care provider's recommendations.   This information is not intended to replace advice given to you by your health care provider. Make sure you discuss any questions you  have with your health care provider.   Document Released: 08/10/2001 Document Revised: 07/05/2014 Document Reviewed: 11/09/2010 Elsevier Interactive Patient Education [employer].

## 2015-08-09 LAB — CBC
HEMATOCRIT: 43.2 % (ref 39.0–52.0)
HEMOGLOBIN: 13.4 g/dL (ref 13.0–17.0)
MCHC: 31 g/dL (ref 30.0–36.0)
MCV: 86.6 fl (ref 78.0–100.0)
PLATELETS: 288 10*3/uL (ref 150.0–400.0)
RBC: 4.99 Mil/uL (ref 4.22–5.81)
RDW: 13.4 % (ref 11.5–15.5)
WBC: 6.5 10*3/uL (ref 4.0–10.5)

## 2015-08-09 LAB — HIV ANTIBODY (ROUTINE TESTING W REFLEX): HIV: NONREACTIVE

## 2015-08-17 NOTE — Assessment & Plan Note (Signed)
Patient continues to struggle with interrupted sleep but uses CPAP regularly. Encouraged good sleep hygiene such as dark, quiet room. No blue/green glowing lights such as computer screens in bedroom. No alcohol or stimulants in evening. Cut down on caffeine as able. Regular exercise is helpful but not just prior to bed time. Try Melatonin prn

## 2015-08-17 NOTE — Assessment & Plan Note (Signed)
Encouraged heart healthy diet, increase exercise, avoid trans fats, consider a krill oil cap daily 

## 2015-08-18 LAB — COLOGUARD: COLOGUARD: NEGATIVE

## 2015-08-29 ENCOUNTER — Telehealth: Payer: Self-pay | Admitting: Family Medicine

## 2015-08-29 ENCOUNTER — Encounter: Payer: Self-pay | Admitting: Family Medicine

## 2015-08-29 NOTE — Telephone Encounter (Signed)
Received Cologuard results.  Which were negative Abstracted into the chart and mailed a copy to the patients home.

## 2016-08-12 ENCOUNTER — Encounter: Payer: BLUE CROSS/BLUE SHIELD | Admitting: Family Medicine

## 2016-09-06 ENCOUNTER — Encounter: Payer: Self-pay | Admitting: Family Medicine

## 2016-09-06 ENCOUNTER — Ambulatory Visit (INDEPENDENT_AMBULATORY_CARE_PROVIDER_SITE_OTHER): Payer: BLUE CROSS/BLUE SHIELD | Admitting: Family Medicine

## 2016-09-06 VITALS — BP 117/58 | HR 61 | Temp 97.7°F | Resp 18 | Ht 69.0 in | Wt 185.4 lb

## 2016-09-06 DIAGNOSIS — E782 Mixed hyperlipidemia: Secondary | ICD-10-CM | POA: Diagnosis not present

## 2016-09-06 DIAGNOSIS — Z0000 Encounter for general adult medical examination without abnormal findings: Secondary | ICD-10-CM

## 2016-09-06 DIAGNOSIS — M545 Low back pain, unspecified: Secondary | ICD-10-CM

## 2016-09-06 DIAGNOSIS — G4733 Obstructive sleep apnea (adult) (pediatric): Secondary | ICD-10-CM | POA: Diagnosis not present

## 2016-09-06 DIAGNOSIS — O26891 Other specified pregnancy related conditions, first trimester: Secondary | ICD-10-CM

## 2016-09-06 HISTORY — DX: Other specified pregnancy related conditions, first trimester: O26.891

## 2016-09-06 HISTORY — DX: Low back pain, unspecified: M54.50

## 2016-09-06 LAB — COMPREHENSIVE METABOLIC PANEL
ALBUMIN: 4.2 g/dL (ref 3.5–5.2)
ALK PHOS: 87 U/L (ref 39–117)
ALT: 12 U/L (ref 0–53)
AST: 14 U/L (ref 0–37)
BILIRUBIN TOTAL: 0.9 mg/dL (ref 0.2–1.2)
BUN: 9 mg/dL (ref 6–23)
CO2: 29 mEq/L (ref 19–32)
CREATININE: 0.82 mg/dL (ref 0.40–1.50)
Calcium: 9.1 mg/dL (ref 8.4–10.5)
Chloride: 107 mEq/L (ref 96–112)
GFR: 104.98 mL/min (ref >60.00)
GLUCOSE: 89 mg/dL (ref 70–99)
POTASSIUM: 4 meq/L (ref 3.5–5.1)
SODIUM: 140 meq/L (ref 135–145)
TOTAL PROTEIN: 6.9 g/dL (ref 6.0–8.3)

## 2016-09-06 LAB — LIPID PANEL
Cholesterol: 160 mg/dL (ref 0–200)
HDL: 31.3 mg/dL — ABNORMAL LOW (ref >39.00)
LDL Cholesterol: 102 mg/dL — ABNORMAL HIGH (ref 0–99)
NONHDL: 129.12
Total CHOL/HDL Ratio: 5
Triglycerides: 136 mg/dL (ref 0.0–149.0)
VLDL: 27.2 mg/dL (ref 0.0–40.0)

## 2016-09-06 NOTE — Patient Instructions (Signed)
Try Aspercreme or Salon Pas for the low back Lidocaine Gel or [place] Years, Male Preventive care refers to lifestyle choices and visits with your health care provider that can promote health and wellness. What does preventive care include?  A yearly physical exam. This is also called an annual well check.  Dental exams once or twice a year.  Routine eye exams. Ask your health care provider how often you should have your eyes checked.  Personal lifestyle choices, including:  Daily care of your teeth and gums.  Regular physical activity.  Eating a healthy diet.  Avoiding tobacco and drug use.  Limiting alcohol use.  Practicing safe sex.  Taking low-dose aspirin every day starting at age 21. What happens during an annual well check? The services and screenings done by your health care provider during your annual well check will depend on your age, overall health, lifestyle risk factors, and family history of disease. Counseling  Your health care provider may ask you questions about your:  Alcohol use.  Tobacco use.  Drug use.  Emotional well-being.  Home and relationship well-being.  Sexual activity.  Eating habits.  Work and work Statistician. Screening  You may have the following tests or measurements:  Height, weight, and BMI.  Blood pressure.  Lipid and cholesterol levels. These may be checked every 5 years, or more frequently if you are over 25 years old.  Skin check.  Lung cancer screening. You may have this screening every year starting at age 13 if you have a 30-pack-year history of smoking and currently smoke or have quit within the past 15 years.  Fecal occult blood test (FOBT) of the stool. You may have this test every year starting at age 57.  Flexible sigmoidoscopy or colonoscopy. You may have a sigmoidoscopy every 5 years or a colonoscopy every 10 years starting at age 76.  Prostate cancer screening. Recommendations  will vary depending on your family history and other risks.  Hepatitis C blood test.  Hepatitis B blood test.  Sexually transmitted disease (STD) testing.  Diabetes screening. This is done by checking your blood sugar (glucose) after you have not eaten for a while (fasting). You may have this done every 1-3 years. Discuss your test results, treatment options, and if necessary, the need for more tests with your health care provider. Vaccines  Your health care provider may recommend certain vaccines, such as:  Influenza vaccine. This is recommended every year.  Tetanus, diphtheria, and acellular pertussis (Tdap, Td) vaccine. You may need a Td booster every 10 years.  Varicella vaccine. You may need this if you have not been vaccinated.  Zoster vaccine. You may need this after age 62.  Measles, mumps, and rubella (MMR) vaccine. You may need at least one dose of MMR if you were born in 1957 or later. You may also need a second dose.  Pneumococcal 13-valent conjugate (PCV13) vaccine. You may need this if you have certain conditions and have not been vaccinated.  Pneumococcal polysaccharide (PPSV23) vaccine. You may need one or two doses if you smoke cigarettes or if you have certain conditions.  Meningococcal vaccine. You may need this if you have certain conditions.  Hepatitis A vaccine. You may need this if you have certain conditions or if you travel or work in places where you may be exposed to hepatitis A.  Hepatitis B vaccine. You may need this if you have certain conditions or if you travel or work  in places where you may be exposed to hepatitis B.  Haemophilus influenzae type b (Hib) vaccine. You may need this if you have certain risk factors. Talk to your health care provider about which screenings and vaccines you need and how often you need them. This information is not intended to replace advice given to you by your health care provider. Make sure you discuss any questions  you have with your health care provider. Document Released: 07/11/2015 Document Revised: 03/03/2016 Document Reviewed: 04/15/2015 Elsevier Interactive Patient Education  2017 [employer].

## 2016-09-06 NOTE — Assessment & Plan Note (Signed)
Encouraged heart healthy diet, increase exercise, avoid trans fats, consider a krill oil cap daily Check labs today 

## 2016-09-06 NOTE — Assessment & Plan Note (Signed)
He reports some frustration with fatigue and early morning awakening, he has called [facility] and they provided him with new tubing and mask and he thinks it is helping. If it does not help, consider referral to pulmonary for further consideration

## 2016-09-06 NOTE — Progress Notes (Signed)
Subjective:  I acted as a Neurosurgeon for Dr. Abner Greenspan. Michael Gibbs, [place]   [name], male    DOB: 10-Dec-1965, 51 y.o.   MRN: [id number]  Chief Complaint  Patient presents with  . Annual Exam  . Hyperlipidemia  . Sleep Apnea    Hyperlipidemia  Pertinent negatives include no shortness of breath.  Back Pain  This is a recurrent problem. The current episode started more than 1 year ago. The problem occurs intermittently. The problem is unchanged. The pain is present in the lumbar spine. The quality of the pain is described as aching. The pain does not radiate. He has tried home exercises for the symptoms. The treatment provided mild relief.    Patient is in today for an annual exam, following up on hyperlipidemia.  Patient presents with sleep apnea concerns and also c/o  lower back pain. Back is stiff after prolonged sitting and on first arising. No radicular symptoms or change in bowel or bladder habits. No falls or injury. He reports some frustration with fatigue and early morning awakening, he has called [facility] and they provided him with new tubing and mask and he thinks it is helping. Denies CP/palp/SOB/HA/congestion/fevers/GI or GU c/o. Taking meds as prescribed Patient Care Team: Bradd Canary, MD as PCP - General (Family Medicine)   Past Medical History:  Diagnosis Date  . Allergic state 04/25/2007   Seasonal allergies-well controlled with Allegra and Flonase    . Dermatitis 09/22/2011  . Headache(784.0)   . Headache(784.0) 09/22/2011  . Hyperlipidemia, mixed 08/11/2014  . Low back pain during pregnancy in first trimester 09/06/2016  . Preventative health care 04/02/2011  . Sleep apnea   . Sleep apnea     History reviewed. No pertinent surgical history.  Family History  Problem Relation Age of Onset  . Diabetes Mother     diet controlled DM  . Other Father     bowel obstruction with resection  . Cancer Paternal Grandmother     esophageal  cancer  . GER disease Paternal Grandmother   . Depression Brother     Social History   Social History  . Marital status: Married    Spouse name: N/A  . Number of children: N/A  . Years of education: N/A   Occupational History  . Not on file.   Social History Main Topics  . Smoking status: Never Smoker  . Smokeless tobacco: Never Used  . Alcohol use Yes     Comment: rare beer  . Drug use: No  . 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 Not on file   Social History Narrative   Patient is from [place] but lives in [place], has been in the [place] since 1996, lives with wife and 2 children-ages 9 and 7, wife works from home, her office is in [place]. Remainder family lives in [place]. Patient works in Consulting civil engineer. Enjoys tennis. Vegetarian.    Outpatient Medications Prior to Visit  Medication Sig Dispense Refill  . fexofenadine-pseudoephedrine (ALLEGRA-D 12 HOUR) 60-120 MG per tablet Take 1 tablet by mouth 2 (two) times daily as needed. 60 tablet 2  . naproxen sodium (ALEVE) 220 MG tablet Take 220 mg by mouth 2 (two) times daily as needed.     No facility-administered medications prior to visit.     No Known Allergies  Review of  Systems  Constitutional: Positive for malaise/fatigue.  HENT: Negative for congestion.   Eyes: Negative for blurred vision.  Respiratory: Negative for cough and shortness of breath.   Cardiovascular: Negative for palpitations and leg swelling.  Gastrointestinal: Negative for vomiting.  Musculoskeletal: Positive for back pain.  Skin: Negative for rash.  Neurological: Negative for loss of consciousness.       Objective:    Physical Exam  Constitutional: He is oriented to person, place, and time. He appears well-developed and well-nourished. No distress.  HENT:  Head: Normocephalic and atraumatic.  Eyes: Conjunctivae are normal.  Neck: Normal range of  motion. No thyromegaly present.  Cardiovascular: Normal rate and regular rhythm.   Pulmonary/Chest: Effort normal and breath sounds normal. He has no wheezes.  Abdominal: Soft. Bowel sounds are normal. There is no tenderness.  Musculoskeletal: Normal range of motion. He exhibits no edema or deformity.  Neurological: He is alert and oriented to person, place, and time.  Skin: Skin is warm and dry. He is not diaphoretic.  Psychiatric: He has a normal mood and affect.    BP (!) 117/58 (BP Location: Left Arm, Patient Position: Sitting, Cuff Size: Normal)   Pulse 61   Temp 97.7 F (36.5 C) (Oral)   Resp 18   Ht 5\' 9"  (1.753 m)   Wt 185 lb 6.4 oz (84.1 kg)   SpO2 100%   BMI 27.38 kg/m  Wt Readings from Last 3 Encounters:  09/06/16 185 lb 6.4 oz (84.1 kg)  08/08/15 180 lb (81.6 kg)  08/02/14 179 lb 12.8 oz (81.6 kg)     Lab Results  Component Value Date   WBC 6.5 08/08/2015   HGB 13.4 08/08/2015   HCT 43.2 08/08/2015   PLT 288.0 08/08/2015   GLUCOSE 89 08/08/2015   CHOL 177 08/08/2015   TRIG 184.0 (H) 08/08/2015   HDL 32.10 (L) 08/08/2015   LDLDIRECT 110 (H) 04/25/2007   LDLCALC 108 (H) 08/08/2015   ALT 17 08/08/2015   AST 15 08/08/2015   NA 141 08/08/2015   K 3.9 08/08/2015   CL 104 08/08/2015   CREATININE 0.80 08/08/2015   BUN 11 08/08/2015   CO2 31 08/08/2015   TSH 2.07 08/08/2015    Lab Results  Component Value Date   TSH 2.07 08/08/2015   Lab Results  Component Value Date   WBC 6.5 08/08/2015   HGB 13.4 08/08/2015   HCT 43.2 08/08/2015   MCV 86.6 08/08/2015   PLT 288.0 08/08/2015   Lab Results  Component Value Date   NA 141 08/08/2015   K 3.9 08/08/2015   CO2 31 08/08/2015   GLUCOSE 89 08/08/2015   BUN 11 08/08/2015   CREATININE 0.80 08/08/2015   BILITOT 1.0 08/08/2015   ALKPHOS 95 08/08/2015   AST 15 08/08/2015   ALT 17 08/08/2015   PROT 7.8 08/08/2015   ALBUMIN 4.4 08/08/2015   CALCIUM 9.5 08/08/2015   GFR 108.48 08/08/2015   Lab Results    Component Value Date   CHOL 177 08/08/2015   Lab Results  Component Value Date   HDL 32.10 (L) 08/08/2015   Lab Results  Component Value Date   LDLCALC 108 (H) 08/08/2015   Lab Results  Component Value Date   TRIG 184.0 (H) 08/08/2015   Lab Results  Component Value Date   CHOLHDL 6 08/08/2015   No results found for: HGBA1C     Assessment & Plan:   Problem List Items Addressed This Visit  Obstructive sleep apnea    He reports some frustration with fatigue and early morning awakening, he has called [facility] and they provided him with new tubing and mask and he thinks it is helping. If it does not help, consider referral to pulmonary for further consideration      Preventative health care - Primary    Patient encouraged to maintain heart healthy diet, regular exercise, adequate sleep. Consider daily probiotics. Take medications as prescribed. Has HCP documents will bring [place] a copy of documents.       Relevant Orders   Comprehensive metabolic panel   Lipid panel   Hyperlipidemia, mixed    Encouraged heart healthy diet, increase exercise, avoid trans fats, consider a krill oil cap daily. Check labs today      Relevant Orders   Lipid panel   Low back pain    Encouraged moist heat and gentle stretching as tolerated. May try NSAIDs and prescription meds as directed and report if symptoms worsen or seek immediate care         I am having Mr. Glaude maintain his naproxen sodium and fexofenadine-pseudoephedrine.  No orders of the defined types were placed in this encounter.   CMA served as Neurosurgeon during this visit. History, Physical and Plan performed by medical provider. Documentation and orders reviewed and attested to.  Danise Edge, MD

## 2016-09-06 NOTE — Assessment & Plan Note (Signed)
Encouraged moist heat and gentle stretching as tolerated. May try NSAIDs and prescription meds as directed and report if symptoms worsen or seek immediate care 

## 2016-09-06 NOTE — Assessment & Plan Note (Signed)
Patient encouraged to maintain heart healthy diet, regular exercise, adequate sleep. Consider daily probiotics. Take medications as prescribed. Has HCP documents will bring us a copy of documents.

## 2016-11-03 ENCOUNTER — Encounter: Payer: Self-pay | Admitting: Medical

## 2016-11-03 ENCOUNTER — Ambulatory Visit (HOSPITAL_BASED_OUTPATIENT_CLINIC_OR_DEPARTMENT_OTHER)
Admission: RE | Admit: 2016-11-03 | Discharge: 2016-11-03 | Disposition: A | Discharge status: Home / Self Care | Payer: BLUE CROSS/BLUE SHIELD | Source: Ambulatory Visit | Attending: Medical | Admitting: Medical

## 2016-11-03 ENCOUNTER — Ambulatory Visit (INDEPENDENT_AMBULATORY_CARE_PROVIDER_SITE_OTHER): Payer: BLUE CROSS/BLUE SHIELD | Admitting: Medical

## 2016-11-03 VITALS — BP 108/80 | HR 78 | Temp 98.0°F | Resp 16 | Ht 69.0 in | Wt 180.0 lb

## 2016-11-03 DIAGNOSIS — M778 Other enthesopathies, not elsewhere classified: Secondary | ICD-10-CM | POA: Insufficient documentation

## 2016-11-03 DIAGNOSIS — M47896 Other spondylosis, lumbar region: Secondary | ICD-10-CM | POA: Diagnosis not present

## 2016-11-03 DIAGNOSIS — M545 Low back pain, unspecified: Secondary | ICD-10-CM

## 2016-11-03 MED ORDER — CYCLOBENZAPRINE HCL 5 MG PO TABS: 5.0000 mg | ORAL_TABLET | Freq: Every day | ORAL | 0 refills | Status: DC

## 2016-11-03 MED ORDER — DICLOFENAC SODIUM 75 MG PO TBEC
75.0000 mg | DELAYED_RELEASE_TABLET | Freq: Two times a day (BID) | ORAL | 0 refills | Status: DC
Start: 2016-11-03 — End: 2017-12-06

## 2016-11-03 NOTE — Patient Instructions (Addendum)
For back pain we will get xray of back today.  For pain/inflammation  rx diclofenac and also rx muscle relaxant flexeril to use at night.  Back stretching exercises as tolerated. If pain persist by next week wed can refer to sports med or PT  Also advise thermacare heat patch to lower back.  Follow up in 7 days or as needed   Back Exercises If you have pain in your back, do these exercises 2-3 times each day or as told by your doctor. When the pain goes away, do the exercises once each day, but repeat the steps more times for each exercise (do more repetitions). If you do not have pain in your back, do these exercises once each day or as told by your doctor. Exercises Single Knee to Chest   Do these steps 3-5 times in a row for each leg: 1. Lie on your back on a firm bed or the floor with your legs stretched out. 2. Bring one knee to your chest. 3. Hold your knee to your chest by grabbing your knee or thigh. 4. Pull on your knee until you feel a gentle stretch in your lower back. 5. Keep doing the stretch for 10-30 seconds. 6. Slowly let go of your leg and straighten it. Pelvic Tilt   Do these steps 5-10 times in a row: 1. Lie on your back on a firm bed or the floor with your legs stretched out. 2. Bend your knees so they point up to the ceiling. Your feet should be flat on the floor. 3. Tighten your lower belly (abdomen) muscles to press your lower back against the floor. This will make your tailbone point up to the ceiling instead of pointing down to your feet or the floor. 4. Stay in this position for 5-10 seconds while you gently tighten your muscles and breathe evenly. Cat-Cow   Do these steps until your lower back bends more easily: 1. Get on your hands and knees on a firm surface. Keep your hands under your shoulders, and keep your knees under your hips. You may put padding under your knees. 2. Let your head hang down, and make your tailbone point down to the floor so your  lower back is round like the back of a cat. 3. Stay in this position for 5 seconds. 4. Slowly lift your head and make your tailbone point up to the ceiling so your back hangs low (sags) like the back of a cow. 5. Stay in this position for 5 seconds. Press-Ups   Do these steps 5-10 times in a row: 1. Lie on your belly (face-down) on the floor. 2. Place your hands near your head, about shoulder-width apart. 3. While you keep your back relaxed and keep your hips on the floor, slowly straighten your arms to raise the top half of your body and lift your shoulders. Do not use your back muscles. To make yourself more comfortable, you may change where you place your hands. 4. Stay in this position for 5 seconds. 5. Slowly return to lying flat on the floor. Bridges   Do these steps 10 times in a row: 1. Lie on your back on a firm surface. 2. Bend your knees so they point up to the ceiling. Your feet should be flat on the floor. 3. Tighten your butt muscles and lift your butt off of the floor until your waist is almost as high as your knees. If you do not feel the muscles working  in your butt and the back of your thighs, slide your feet 1-2 inches farther away from your butt. 4. Stay in this position for 3-5 seconds. 5. Slowly lower your butt to the floor, and let your butt muscles relax. If this exercise is too easy, try doing it with your arms crossed over your chest. Belly Crunches   Do these steps 5-10 times in a row: 1. Lie on your back on a firm bed or the floor with your legs stretched out. 2. Bend your knees so they point up to the ceiling. Your feet should be flat on the floor. 3. Cross your arms over your chest. 4. Tip your chin a little bit toward your chest but do not bend your neck. 5. Tighten your belly muscles and slowly raise your chest just enough to lift your shoulder blades a tiny bit off of the floor. 6. Slowly lower your chest and your head to the floor. Back Lifts  Do these  steps 5-10 times in a row: 1. Lie on your belly (face-down) with your arms at your sides, and rest your forehead on the floor. 2. Tighten the muscles in your legs and your butt. 3. Slowly lift your chest off of the floor while you keep your hips on the floor. Keep the back of your head in line with the curve in your back. Look at the floor while you do this. 4. Stay in this position for 3-5 seconds. 5. Slowly lower your chest and your face to the floor. Contact a doctor if:  Your back pain gets a lot worse when you do an exercise.  Your back pain does not lessen 2 hours after you exercise. If you have any of these problems, stop doing the exercises. Do not do them again unless your doctor says it is okay. Get help right away if:  You have sudden, very bad back pain. If this happens, stop doing the exercises. Do not do them again unless your doctor says it is okay. This information is not intended to replace advice given to you by your health care provider. Make sure you discuss any questions you have with your health care provider. Document Released: 07/17/2010 Document Revised: 11/20/2015 Document Reviewed: 08/08/2014 Elsevier Interactive Patient Education  2017 [employer].

## 2016-11-03 NOTE — Progress Notes (Signed)
Subjective:    [name], male    DOB: Jan 14, 1966, 51 y.o.   MRN: [id number]  HPI  Pt in with some pain 3-4 weeks back. Some pain noted first on exercising. Pt pain last 3-4 days worsening. Some spasms at times. Also states sometimes mild change of position causes severe pain.  Pt states never had pain in his back before. Pt had some pain worsened  mowing yard pastweekend.  No pain shooting to legs, no weakness, and  no numbness to legs.  Pain became intense after mowing grass. 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  Last 2 days pain eased up some. 2 days ago 7/10 pain. Today 2-3/10 pain.      Review of Systems  Constitutional: Negative for chills, fatigue and fever.  Respiratory: Negative for cough, chest tightness, shortness of breath and wheezing.   Cardiovascular: Negative for chest pain and palpitations.  Gastrointestinal: Negative for abdominal pain, anal bleeding, blood in stool, constipation, diarrhea and rectal pain.  Musculoskeletal: Positive for back pain. Negative for arthralgias, gait problem, joint swelling, myalgias and neck stiffness.  Skin: Negative for pallor and rash.  Neurological: Negative for dizziness, speech difficulty, weakness and headaches.  Hematological: Negative for adenopathy. Does not bruise/bleed easily.  Psychiatric/Behavioral: Negative for behavioral problems and confusion.   Past Medical History:  Diagnosis Date  . Allergic state 04/25/2007   Seasonal allergies-well controlled with Allegra and Flonase    . Dermatitis 09/22/2011  . Headache(784.0)   . Headache(784.0) 09/22/2011  . Hyperlipidemia, mixed 08/11/2014  . Low back pain during pregnancy in first trimester 09/06/2016  . Preventative health care 04/02/2011  . Sleep apnea   . Sleep apnea      Social History   Social History  . Marital status: Married    Spouse name: N/A  . Number of children: N/A  . Years of education: N/A   Occupational History  . Not on  file.   Social History Main Topics  . Smoking status: Never Smoker  . Smokeless tobacco: Never Used  . Alcohol use Yes     Comment: rare beer  . Drug use: No  . 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 Not on file   Social History Narrative   Patient is from [place] but lives in [place], has been [place] the [place] since [place], lives with wife and 2 children-ages 9 and 7, wife works from home, her office [place] in [place]. Remainder family lives in [place]. 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 Vegetarian.    No past surgical history on file.  Family History  Problem Relation Age of Onset  . Diabetes Mother     diet controlled DM  . Other Father     bowel obstruction with resection  . Cancer Paternal Grandmother     esophageal cancer  . GER disease Paternal Grandmother   . Depression Brother     No Known Allergies  Current Outpatient Prescriptions on File Prior to Visit  Medication Sig Dispense Refill  . fexofenadine-pseudoephedrine (ALLEGRA-D 12 HOUR) 60-120 MG per tablet Take 1 tablet by mouth 2 (two) times daily as needed. 60 tablet 2  . naproxen sodium (ALEVE) 220 MG tablet Take 220 mg by mouth 2 (two) times daily as needed.     No current facility-administered medications on file prior to visit.  BP 108/80 (BP Location: Left Arm, Patient Position: Sitting, Cuff Size: Normal)   Pulse 78   Temp 98 F (36.7 C) (Oral)   Resp 16   Ht 5\' 9"  (1.753 m)   Wt 180 lb (81.6 kg)   SpO2 99%   BMI 26.58 kg/m      Objective:   Physical Exam  General Appearance- Not in acute distress.    Chest and Lung Exam Auscultation: Breath sounds:-Normal. Clear even and unlabored. Adventitious sounds:- No Adventitious sounds.  Cardiovascular Auscultation:Rythm - Regular, rate and rythm. Heart Sounds -Normal heart sounds.  Abdomen Inspection:-Inspection Normal.  Palpation/Perucssion:  Palpation and Percussion of the abdomen reveal- Non Tender, No Rebound tenderness, No rigidity(Guarding) and No Palpable abdominal masses.  Liver:-Normal.  Spleen:- Normal.   Back Para lumbar spine tenderness to palpation. More on left side than rt. No pain on straight leg lift. Pain on lateral movements and flexion/extension of the spine. Pain on changing position  Lower ext neurologic  L5-S1 sensation intact bilaterally. Normal patellar reflexes bilaterally. No foot drop bilaterally.      Assessment & Plan:  For back pain we will get xray of back today.  For pain rx diclofenac and also rx muscle relaxant flexeril to use at night.  Back stretching exercises as tolerated. If pain persist by next week wed can refer to sports med or PT  Also advise thermacare heat patch to lower back.  Follow up in 7 days or as needed  Darrel Baroni, Ramon Dredge, [employer]

## 2016-11-03 NOTE — Progress Notes (Signed)
Pre visit review using our clinic review tool, if applicable. No additional management support is needed unless otherwise documented below in the visit note. 

## 2017-09-08 ENCOUNTER — Encounter: Payer: BLUE CROSS/BLUE SHIELD | Admitting: Family Medicine

## 2017-09-30 ENCOUNTER — Encounter: Payer: BLUE CROSS/BLUE SHIELD | Admitting: Family Medicine

## 2017-12-06 ENCOUNTER — Encounter: Payer: Self-pay | Admitting: Family Medicine

## 2017-12-06 ENCOUNTER — Ambulatory Visit (INDEPENDENT_AMBULATORY_CARE_PROVIDER_SITE_OTHER): Payer: BLUE CROSS/BLUE SHIELD | Admitting: Family Medicine

## 2017-12-06 VITALS — BP 120/80 | HR 71 | Temp 97.8°F | Resp 18 | Ht 69.0 in | Wt 178.6 lb

## 2017-12-06 DIAGNOSIS — Z23 Encounter for immunization: Secondary | ICD-10-CM

## 2017-12-06 DIAGNOSIS — Z125 Encounter for screening for malignant neoplasm of prostate: Secondary | ICD-10-CM

## 2017-12-06 DIAGNOSIS — M542 Cervicalgia: Secondary | ICD-10-CM

## 2017-12-06 DIAGNOSIS — G8929 Other chronic pain: Secondary | ICD-10-CM

## 2017-12-06 DIAGNOSIS — G4733 Obstructive sleep apnea (adult) (pediatric): Secondary | ICD-10-CM

## 2017-12-06 DIAGNOSIS — M545 Low back pain: Secondary | ICD-10-CM

## 2017-12-06 DIAGNOSIS — E782 Mixed hyperlipidemia: Secondary | ICD-10-CM

## 2017-12-06 DIAGNOSIS — Z0000 Encounter for general adult medical examination without abnormal findings: Secondary | ICD-10-CM | POA: Diagnosis not present

## 2017-12-06 NOTE — Assessment & Plan Note (Signed)
Patient encouraged to maintain heart healthy diet, regular exercise, adequate sleep. Consider daily probiotics. Take medications as prescribed 

## 2017-12-06 NOTE — Patient Instructions (Addendum)
For back pain try Lidocaine gel and/or Lidocaine patches made by the companies [employer], [employer], [employer]   Shingrix is the new shingles shot, 2 shots over 2-6 months at pharmacy or here. Check insurance regarding payment   Preventive Care 40-64 Years, Male Preventive care refers to lifestyle choices and visits with your health care provider that can promote health and wellness. What does preventive care include?  A yearly physical exam. This is also called an annual well check.  Dental exams once or twice a year.  Routine eye exams. Ask your health care provider how often you should have your eyes checked.  Personal lifestyle choices, including: ? Daily care of your teeth and gums. ? Regular physical activity. ? Eating a healthy diet. ? Avoiding tobacco and drug use. ? Limiting alcohol use. ? Practicing safe sex. ? Taking low-dose aspirin every day starting at age 28. What happens during an annual well check? The services and screenings done by your health care provider during your annual well check will depend on your age, overall health, lifestyle risk factors, and family history of disease. Counseling Your health care provider may ask you questions about your:  Alcohol use.  Tobacco use.  Drug use.  Emotional well-being.  Home and relationship well-being.  Sexual activity.  Eating habits.  Work and work Statistician.  Screening You may have the following tests or measurements:  Height, weight, and BMI.  Blood pressure.  Lipid and cholesterol levels. These may be checked every 5 years, or more frequently if you are over 31 years old.  Skin check.  Lung cancer screening. You may have this screening every year starting at age 74 if you have a 30-pack-year history of smoking and currently smoke or have quit within the past 15 years.  Fecal occult blood test (FOBT) of the stool. You may have this test every year starting at age 29.  Flexible sigmoidoscopy  or colonoscopy. You may have a sigmoidoscopy every 5 years or a colonoscopy every 10 years starting at age 68.  Prostate cancer screening. Recommendations will vary depending on your family history and other risks.  Hepatitis C blood test.  Hepatitis B blood test.  Sexually transmitted disease (STD) testing.  Diabetes screening. This is done by checking your blood sugar (glucose) after you have not eaten for a while (fasting). You may have this done every 1-3 years.  Discuss your test results, treatment options, and if necessary, the need for more tests with your health care provider. Vaccines Your health care provider may recommend certain vaccines, such as:  Influenza vaccine. This is recommended every year.  Tetanus, diphtheria, and acellular pertussis (Tdap, Td) vaccine. You may need a Td booster every 10 years.  Varicella vaccine. You may need this if you have not been vaccinated.  Zoster vaccine. You may need this after age 17.  Measles, mumps, and rubella (MMR) vaccine. You may need at least one dose of MMR if you were born in 1957 or later. You may also need a second dose.  Pneumococcal 13-valent conjugate (PCV13) vaccine. You may need this if you have certain conditions and have not been vaccinated.  Pneumococcal polysaccharide (PPSV23) vaccine. You may need one or two doses if you smoke cigarettes or if you have certain conditions.  Meningococcal vaccine. You may need this if you have certain conditions.  Hepatitis A vaccine. You may need this if you have certain conditions or if you travel or work in places where you may  be exposed to hepatitis A.  Hepatitis B vaccine. You may need this if you have certain conditions or if you travel or work in places where you may be exposed to hepatitis B.  Haemophilus influenzae type b (Hib) vaccine. You may need this if you have certain risk factors.  Talk to your health care provider about which screenings and vaccines you need  and how often you need them. This information is not intended to replace advice given to you by your health care provider. Make sure you discuss any questions you have with your health care provider. Document Released: 07/11/2015 Document Revised: 03/03/2016 Document Reviewed: 04/15/2015 Elsevier Interactive Patient Education  [employer].

## 2017-12-06 NOTE — Progress Notes (Signed)
Subjective:  I acted as a Neurosurgeon for Dr. Abner Greenspan. [place], [place]  [name], male    DOB: 03-26-1966, 52 y.o.   MRN: [id number]  No chief complaint on file.   HPI  Patient is in today for an annual exam. He is following up on his low back pain, hyperlipidemia and other medical concerns. He is noting increased snoring, fatigue, restless sleep and am headaches despite using his CPAP. No recent febrile illness or hospitalizations. He stays active and tries to maintain a heart healthy diet. He is noting back pain and left arm pain and stiffness despite using chiropractice care. Denies CP/palp/SOB/HA/congestion/fevers/GI or GU c/o. Taking meds as prescribed  Patient Care Team: Bradd Canary, MD as PCP - General (Family Medicine)   Past Medical History:  Diagnosis Date  . Allergic state 04/25/2007   Seasonal allergies-well controlled with Allegra and Flonase    . Dermatitis 09/22/2011  . Headache(784.0)   . Headache(784.0) 09/22/2011  . Hyperlipidemia, mixed 08/11/2014  . Low back pain during pregnancy in first trimester 09/06/2016  . Preventative health care 04/02/2011  . Sleep apnea   . Sleep apnea     No past surgical history on file.  Family History  Problem Relation Age of Onset  . Diabetes Mother        diet controlled DM  . Other Father        bowel obstruction with resection  . Cancer Paternal Grandmother        esophageal cancer  . GER disease Paternal Grandmother   . Depression Brother     Social History   Socioeconomic History  . Marital status: Married    Spouse name: Not on file  . Number of children: Not on file  . Years of education: Not on file  . Highest education level: Not on file  Occupational History  . Not on file  Social Needs  . Financial resource strain: Not on file  . Food insecurity:    Worry: Not on file    Inability: Not on file  . Transportation needs:    Medical: Not on file    Non-medical: Not on file  Tobacco  Use  . Smoking status: Never Smoker  . Smokeless tobacco: Never Used  Substance and Sexual Activity  . Alcohol use: Yes    Comment: rare beer  . Drug use: No  . 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 Physical activity:    Days per week: Not on file    Minutes per session: Not on file  . Stress: Not on file  Relationships  . Social connections:    Talks on phone: Not on file    Gets together: Not on file    Attends religious service: Not on file    Active member of club or organization: Not on file    Attends meetings of clubs or organizations: Not on file    Relationship status: Not on file  . Intimate partner violence:    Fear of current or ex partner: Not on file    Emotionally abused: Not on file    Physically abused: Not on file    Forced sexual activity: Not on file  Other Topics Concern  . Not on file  Social History Narrative   Patient is from [place] but lives in [place] 2002, has been in the [place] since 1996,  lives with wife and 2 children-ages 9 and 7, wife works from home, her office is in [place]. Remainder family lives in [place]. 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 Vegetarian.    Outpatient Medications Prior to Visit  Medication Sig Dispense Refill  . cyclobenzaprine (FLEXERIL) 5 MG tablet Take 1 tablet (5 mg total) by mouth at bedtime. 10 tablet 0  . diclofenac (VOLTAREN) 75 MG EC tablet Take 1 tablet (75 mg total) by mouth 2 (two) times daily. 30 tablet 0  . fexofenadine-pseudoephedrine (ALLEGRA-D 12 HOUR) 60-120 MG per tablet Take 1 tablet by mouth 2 (two) times daily as needed. 60 tablet 2   No facility-administered medications prior to visit.     No Known Allergies  Review of Systems  Constitutional: Positive for malaise/fatigue. Negative for chills and fever.  HENT: Negative for congestion and hearing loss.   Eyes: Negative for discharge.  Respiratory: Negative for cough,  sputum production and shortness of breath.   Cardiovascular: Negative for chest pain, palpitations and leg swelling.  Gastrointestinal: Negative for abdominal pain, blood in stool, constipation, diarrhea, heartburn, nausea and vomiting.  Genitourinary: Negative for dysuria, frequency, hematuria and urgency.  Musculoskeletal: Positive for back pain and joint pain. Negative for falls and myalgias.  Skin: Negative for rash.  Neurological: Positive for headaches. Negative for dizziness, sensory change, loss of consciousness and weakness.  Endo/Heme/Allergies: Negative for environmental allergies. Does not bruise/bleed easily.  Psychiatric/Behavioral: Negative for depression and suicidal ideas. The patient is not nervous/anxious and does not have insomnia.        Objective:    Physical Exam  Constitutional: He is oriented to person, place, and time. He appears well-developed and well-nourished. No distress.  HENT:  Head: Normocephalic and atraumatic.  Eyes: Conjunctivae are normal.  Neck: Neck supple. No thyromegaly present.  Cardiovascular: Normal rate, regular rhythm and normal heart sounds.  No murmur heard. Pulmonary/Chest: Effort normal and breath sounds normal. No respiratory distress. He has no wheezes.  Abdominal: Soft. Bowel sounds are normal. He exhibits no mass. There is no tenderness.  Musculoskeletal: He exhibits no edema.  Lymphadenopathy:    He has no cervical adenopathy.  Neurological: He is alert and oriented to person, place, and time.  Skin: Skin is warm and dry.  Psychiatric: He has a normal mood and affect. His behavior is normal.    BP 120/80 (BP Location: Left Arm, Patient Position: Sitting, Cuff Size: Normal)   Pulse 71   Temp 97.8 F (36.6 C) (Oral)   Resp 18   Ht 5\' 9"  (1.753 m)   Wt 178 lb 9.6 oz (81 kg)   SpO2 98%   BMI 26.37 kg/m  Wt Readings from Last 3 Encounters:  12/06/17 178 lb 9.6 oz (81 kg)  11/03/16 180 lb (81.6 kg)  09/06/16 185 lb 6.4 oz  (84.1 kg)   BP Readings from Last 3 Encounters:  12/06/17 120/80  11/03/16 108/80  09/06/16 (!) 117/58     Immunization History  Administered Date(s) Administered  . Influenza Whole 04/25/2007, 03/28/2008  . Influenza-Unspecified 03/28/2014, 03/29/2015  . Td 04/25/2007  . Tdap 12/06/2017    Health Maintenance  Topic Date Due  . INFLUENZA VACCINE  01/26/2018  . Fecal DNA (Cologuard)  08/17/2018  . TETANUS/TDAP  12/07/2027  . HIV Screening  Completed    Lab Results  Component Value Date   WBC 7.0 12/06/2017   HGB 14.5 12/06/2017   HCT 42.4 12/06/2017   PLT 261.0 12/06/2017  GLUCOSE 89 09/06/2016   CHOL 160 09/06/2016   TRIG 136.0 09/06/2016   HDL 31.30 (L) 09/06/2016   LDLDIRECT 110 (H) 04/25/2007   LDLCALC 102 (H) 09/06/2016   ALT 12 09/06/2016   AST 14 09/06/2016   NA 140 09/06/2016   K 4.0 09/06/2016   CL 107 09/06/2016   CREATININE 0.82 09/06/2016   BUN 9 09/06/2016   CO2 29 09/06/2016   TSH 2.68 12/06/2017   PSA 0.60 12/06/2017    Lab Results  Component Value Date   TSH 2.68 12/06/2017   Lab Results  Component Value Date   WBC 7.0 12/06/2017   HGB 14.5 12/06/2017   HCT 42.4 12/06/2017   MCV 81.7 12/06/2017   PLT 261.0 12/06/2017   Lab Results  Component Value Date   NA 140 09/06/2016   K 4.0 09/06/2016   CO2 29 09/06/2016   GLUCOSE 89 09/06/2016   BUN 9 09/06/2016   CREATININE 0.82 09/06/2016   BILITOT 0.9 09/06/2016   ALKPHOS 87 09/06/2016   AST 14 09/06/2016   ALT 12 09/06/2016   PROT 6.9 09/06/2016   ALBUMIN 4.2 09/06/2016   CALCIUM 9.1 09/06/2016   GFR 104.98 09/06/2016   Lab Results  Component Value Date   CHOL 160 09/06/2016   Lab Results  Component Value Date   HDL 31.30 (L) 09/06/2016   Lab Results  Component Value Date   LDLCALC 102 (H) 09/06/2016   Lab Results  Component Value Date   TRIG 136.0 09/06/2016   Lab Results  Component Value Date   CHOLHDL 5 09/06/2016   No results found for: HGBA1C        Assessment & Plan:   Problem List Items Addressed This Visit    Obstructive sleep apnea - Primary    Has used CPAP for years but is questioning if he needs reevaluation. Is referred back to pulmonology for reevaluation.        Relevant Orders   Ambulatory referral to Pulmonology   Preventative health care    Patient encouraged to maintain heart healthy diet, regular exercise, adequate sleep. Consider daily probiotics. Take medications as prescribed.      Relevant Orders   TSH (Completed)   CBC (Completed)   PSA (Completed)   Hyperlipidemia, mixed    Encouraged heart healthy diet, increase exercise, avoid trans fats, consider a krill oil cap daily      Low back pain    Worsening over past 6 months worse with exercise, tried medications but they did not help. Began to see a [facility]. Pain improved but continued so started PT recently and it is improving slowly.       Neck pain on left side    And into left shoulder. Referred to sports medicine for further consideraion      Relevant Orders   Ambulatory referral to Sports Medicine      I have discontinued Vilas R. Studnicka's fexofenadine-pseudoephedrine, diclofenac, and cyclobenzaprine.  No orders of the defined types were placed in this encounter.   CMA served as Neurosurgeon during this visit. History, Physical and Plan performed by medical provider. Documentation and orders reviewed and attested to.  Danise Edge, MD

## 2017-12-06 NOTE — Assessment & Plan Note (Signed)
Worsening over past 6 months worse with exercise, tried medications but they did not help. Began to see a [facility]rop[facility]. Pain improved but continued so started PT recently and it is improving slowly.

## 2017-12-07 DIAGNOSIS — M542 Cervicalgia: Secondary | ICD-10-CM | POA: Insufficient documentation

## 2017-12-07 LAB — TSH: TSH: 2.68 u[IU]/mL (ref 0.35–4.50)

## 2017-12-07 LAB — CBC
HEMATOCRIT: 42.4 % (ref 39.0–52.0)
Hemoglobin: 14.5 g/dL (ref 13.0–17.0)
MCHC: 34.2 g/dL (ref 30.0–36.0)
MCV: 81.7 fl (ref 78.0–100.0)
Platelets: 261 10*3/uL (ref 150.0–400.0)
RBC: 5.2 Mil/uL (ref 4.22–5.81)
RDW: 14 % (ref 11.5–15.5)
WBC: 7 10*3/uL (ref 4.0–10.5)

## 2017-12-07 LAB — PSA: PSA: 0.6 ng/mL (ref 0.10–4.00)

## 2017-12-07 NOTE — Assessment & Plan Note (Signed)
Encouraged heart healthy diet, increase exercise, avoid trans fats, consider a krill oil cap daily 

## 2017-12-07 NOTE — Assessment & Plan Note (Signed)
And into left shoulder. Referred to sports medicine for further consideraion

## 2017-12-07 NOTE — Assessment & Plan Note (Signed)
Has used CPAP for years but is questioning if he needs reevaluation. Is referred back to pulmonology for reevaluation.

## 2018-02-09 NOTE — Progress Notes (Signed)
Tawana ScaleZach Smith D.O. [facility] [address] [place], [place] [address] Phone: [phone] Subjective:    I'm seeing this patient by the request  of:  Bradd CanaryBlyth, Stacey A, MD   CC: Neck pain  XBM:[id number]:Subjective  [place] is a 52 y.o. male coming in with complaint of neck and back pain. Chronic pain for over 5 years. Pain with exercise specifically stretching. Has had chiropractic care but pain keeps coming back. Has also tried physical therapy which helped. Has stopped the exercises and his pain stopped. Does have pain with walking longer distances. Denies any radiating symptoms.   Also has neck pain with stress. Notes a spasm or tightness in his neck intermittently.  Patient rates the severity of pain and 5 out of 10.  Has taken over-the-counter medications with minimal improvement.  Does not remember any specific injury.  Does do a lot of computer work      Past Medical History:  Diagnosis Date  . Allergic state 04/25/2007   Seasonal allergies-well controlled with Allegra and Flonase    . Dermatitis 09/22/2011  . Headache(784.0)   . Headache(784.0) 09/22/2011  . Hyperlipidemia, mixed 08/11/2014  . Low back pain during pregnancy in first trimester 09/06/2016  . Preventative health care 04/02/2011  . Sleep apnea   . Sleep apnea    No past surgical history on file. Social History   Socioeconomic History  . Marital status: Married    Spouse name: Not on file  . Number of children: Not on file  . Years of education: Not on file  . Highest education level: Not on file  Occupational History  . Not on file  Social Needs  . Financial resource strain: Not on file  . Food insecurity:    Worry: Not on file    Inability: Not on file  . Transportation needs:    Medical: Not on file    Non-medical: Not on file  Tobacco Use  . Smoking status: Never Smoker  . Smokeless tobacco: Never Used  Substance and Sexual Activity  . Alcohol use: Yes    Comment: rare beer  .  Drug use: No  . 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 Physical activity:    Days per week: Not on file    Minutes per session: Not on file  . Stress: Not on file  Relationships  . Social connections:    Talks on phone: Not on file    Gets together: Not on file    Attends religious service: Not on file    Active member of club or organization: Not on file    Attends meetings of clubs or organizations: Not on file    Relationship status: Not on file  Other Topics Concern  . Not on file  Social History Narrative   Patient is from [place], has been [place], lives with wife and 2 children-ages 9 and 7, wife works from home, [place]. Remainder family lives in [place]. 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 Vegetarian.   No Known Allergies Family History  Problem Relation Age of Onset  . Diabetes Mother        diet controlled DM  . Other Father        bowel obstruction with resection  . Cancer Paternal Grandmother  esophageal cancer  . GER disease Paternal Grandmother   . Depression Brother      Past medical history, social, surgical and family history all reviewed in electronic medical record.  No pertanent information unless stated regarding to the chief complaint.   Review of Systems:Review of systems updated and as accurate as of 02/10/18  No headache, visual changes, nausea, vomiting, diarrhea, constipation, dizziness, abdominal pain, skin rash, fevers, chills, night sweats, weight loss, swollen lymph nodes, body aches, joint swelling,  chest pain, shortness of breath, mood changes.  Positive muscle aches  Objective  Blood pressure 108/80, pulse 73, height 5\' 9"  (1.753 m), weight 174 lb (78.9 kg), SpO2 98 %. Systems examined below as of 02/10/18   General: No apparent distress alert and oriented x3 mood and affect normal, dressed  appropriately.  HEENT: Pupils equal, extraocular movements intact  Respiratory: Patient's speak in full sentences and does not appear short of breath  Cardiovascular: No lower extremity edema, non tender, no erythema  Skin: Warm dry intact with no signs of infection or rash on extremities or on axial skeleton.  Abdomen: Soft nontender  Neuro: Cranial nerves II through XII are intact, neurovascularly intact in all extremities with 2+ DTRs and 2+ pulses.  Lymph: No lymphadenopathy of posterior or anterior cervical chain or axillae bilaterally.  Gait normal with good balance and coordination.  MSK:  Non tender with full range of motion and good stability and symmetric strength and tone of shoulders, elbows, wrist, hip, knee and ankles bilaterally.  Neck: Inspection unremarkable. No palpable stepoffs. Negative Spurling's maneuver. Full neck range of motion Grip strength and sensation normal in bilateral hands Strength good C4 to T1 distribution No sensory change to C4 to T1 Negative Hoffman sign bilaterally Reflexes normal Poor posture noted and tightness of the trapezius bilaterally Mild scapular dyskinesis noted  Osteopathic findings C6 flexed rotated and side bent left T3 extended rotated and side bent right inhaled third rib T9 extended rotated and side bent left   97110; 15 additional minutes spent for Therapeutic exercises as stated in above notes.  This included exercises focusing on stretching, strengthening, with significant focus on eccentric aspects.   Long term goals include an improvement in range of motion, strength, endurance as well as avoiding reinjury. Patient's frequency would include in 1-2 times a day, 3-5 times a week for a duration of 6-12 weeks. Exercises that included:  Basic scapular stabilization to include adduction and depression of scapula Scaption, focusing on proper movement and good control Internal and External rotation utilizing a theraband, with elbow  tucked at side entire time Rows with theraband   Proper technique shown and discussed handout in great detail with ATC.  All questions were discussed and answered.     Impression and Recommendations:     This case required medical decision making of moderate complexity.      Note: This dictation was prepared with Dragon dictation along with smaller phrase technology. Any transcriptional errors that result from this process are unintentional.

## 2018-02-10 ENCOUNTER — Ambulatory Visit: Payer: BLUE CROSS/BLUE SHIELD | Admitting: Family Medicine

## 2018-02-10 ENCOUNTER — Encounter: Payer: Self-pay | Admitting: Family Medicine

## 2018-02-10 DIAGNOSIS — M542 Cervicalgia: Secondary | ICD-10-CM

## 2018-02-10 DIAGNOSIS — G2589 Other specified extrapyramidal and movement disorders: Secondary | ICD-10-CM | POA: Insufficient documentation

## 2018-02-10 NOTE — Assessment & Plan Note (Signed)
Postural imbalances.  X-rays not necessary at the moment.  Discussed posture and ergonomics, we discussed working conditions.  Given home exercises for scapular dyskinesis.  Follow up in 4 weeks

## 2018-02-10 NOTE — Patient Instructions (Signed)
Good to see you.  Ice 20 minutes 2 times daily. Usually after activity and before bed. Exercises 3 times a week.  Ok to bike, elliptical and [employer] weights Eat within 30 minutes of working out.  Vitamin D 2000 IU daily  Turmeric 500mg  daily  Tart cherry extract any dose at night Change position at work every 2 hours.  Monitor at eye levels See me again in 4 weeks

## 2018-02-16 ENCOUNTER — Institutional Professional Consult (permissible substitution): Payer: BLUE CROSS/BLUE SHIELD | Admitting: Pulmonary Disease

## 2018-02-16 ENCOUNTER — Ambulatory Visit (INDEPENDENT_AMBULATORY_CARE_PROVIDER_SITE_OTHER): Payer: BLUE CROSS/BLUE SHIELD | Admitting: Pulmonary Disease

## 2018-02-16 ENCOUNTER — Encounter: Payer: Self-pay | Admitting: Pulmonary Disease

## 2018-02-16 VITALS — BP 110/70 | HR 85 | Ht 69.0 in | Wt 177.0 lb

## 2018-02-16 DIAGNOSIS — G4733 Obstructive sleep apnea (adult) (pediatric): Secondary | ICD-10-CM

## 2018-02-16 NOTE — Patient Instructions (Signed)
Will arrange for new CPAP machine Follow up in 2 months after getting new CPAP machine 

## 2018-02-16 NOTE — Progress Notes (Signed)
   Subjective:    Patient ID: Valentina LucksChandrashekhar R Kimberlin, male    DOB: 02/12/1966, 52 y.o.   MRN: [id number]  HPI    Review of Systems  Constitutional: Negative for fever and unexpected weight change.  HENT: Negative for congestion, dental problem, ear pain, nosebleeds, postnasal drip, rhinorrhea, sinus pressure, sneezing, sore throat and trouble swallowing.   Eyes: Negative for redness and itching.  Respiratory: Negative for cough, chest tightness, shortness of breath and wheezing.   Cardiovascular: Negative for palpitations and leg swelling.  Gastrointestinal: Negative for nausea and vomiting.  Genitourinary: Negative for dysuria.  Musculoskeletal: Negative for joint swelling.  Skin: Negative for rash.  Allergic/Immunologic: Negative.  Negative for environmental allergies, food allergies and immunocompromised state.  Neurological: Negative for headaches.  Hematological: Does not bruise/bleed easily.  Psychiatric/Behavioral: Negative for dysphoric mood. The patient is not nervous/anxious.        Objective:   Physical Exam        Assessment & Plan:

## 2018-02-16 NOTE — Progress Notes (Signed)
[name]  Chief Complaint  Patient presents with  . Sleep Consult    Referred by Dr. Abner GreenspanBlyth for possible OSA. Per patient he wakes up a lot at night. Does not feel well rested. Patient has had a SS before and is currently using a CPAP machine. Uses [facility] as his DME.     Constitutional: BP 110/70 (BP Location: Right Arm, Patient Position: Sitting, Cuff Size: Normal)   Pulse 85   Ht 5\' 9"  (1.753 m)   Wt 177 lb (80.3 kg)   SpO2 97%   BMI 26.14 kg/m   History of Present Illness: Michael Gibbs is a 52 y.o. male with obstructive sleep apnea.  He had sleep study in 2010.  Found to have mild sleep apnea.  Has been using same CPAP machine ever since.  He felt better with his sleep when he first started CPAP.  More recently he doesn't feel that the CPAP machine is working as well as before.  He feels his mask is comfortable.  He snores some when wearing CPAP now.  He is feeling more tired during the day.  He goes to sleep at 11 pm.  He falls asleep in 15 minutes.  He wakes up some times to use the bathroom.  He gets out of bed at 630 am.  He feels tired in the morning.  He denies morning headache.  He does not use anything to help him fall sleep or stay awake.  He denies sleep walking, sleep talking, bruxism, or nightmares.  There is no history of restless legs.  He denies sleep hallucinations, sleep paralysis, or cataplexy.  The Epworth score is 2 out of 24.   Comprehensive Respiratory Exam:  Appearance - well kempt  ENMT - nasal mucosa moist, turbinates clear, midline nasal septum, no dental lesions, no gingival bleeding, no oral exudates, no tonsillar hypertrophy Neck - no masses, trachea midline, no thyromegaly, no elevation in JVP Respiratory - normal appearance of chest wall, normal respiratory effort w/o accessory muscle use, no dullness on percussion, no wheezing or rales CV - s1s2 regular rate and rhythm, no murmurs, no peripheral  edema, radial pulses symmetric GI - soft, non tender, no masses Lymph - no adenopathy noted in neck and axillary areas MSK - normal muscle strength and tone, normal gait Ext - no cyanosis, clubbing, or joint inflammation noted Skin - no rashes, lesions, or ulcers Neuro - oriented to person, place, and time Psych - normal mood and affect  Discussion: He has history of mild sleep apnea.  He did well with initial CPAP set up, but has more sleep disruption and daytime sleepiness recently.  His CPAP machine is quite old and probably isn't functioning properly anymore.  Assessment/Plan:  Obstructive sleep apnea. - he reports compliance with CPAP - will arrange for new auto CPAP machine and updated his CPAP supplies - if he is still having difficulty, will then need to arrange for in lab titration study  Obesity. - discussed how weight can impact sleep and risk for sleep disordered breathing - discussed options to assist with weight loss: combination of diet modification, cardiovascular and strength training exercises  Cardiovascular risk. - had an extensive discussion regarding the adverse health consequences related to untreated sleep disordered breathing - specifically discussed the risks for hypertension, coronary artery disease, cardiac dysrhythmias, cerebrovascular disease, and diabetes - lifestyle modification discussed  Safe driving practices. - discussed how sleep disruption can increase risk of accidents, particularly  when driving - safe driving practices were discussed  Therapies for obstructive sleep apnea. - ivarious alternative therapies for treatment were reviewed: oral appliance, and surgical interventions    Patient Instructions  Will arrange for new CPAP machine  Follow up in 2 months after getting new CPAP machine    Coralyn Helling, MD [facility] 02/16/2018, 11:58 AM  Flow Sheet  Sleep tests: PSG 11/29/08 >> AHI 7.8, SpO2 low 78%  Review  of Systems: Constitutional: Negative for fever and unexpected weight change.  HENT: Negative for congestion, dental problem, ear pain, nosebleeds, postnasal drip, rhinorrhea, sinus pressure, sneezing, sore throat and trouble swallowing.   Eyes: Negative for redness and itching.  Respiratory: Negative for cough, chest tightness, shortness of breath and wheezing.   Cardiovascular: Negative for palpitations and leg swelling.  Gastrointestinal: Negative for nausea and vomiting.  Genitourinary: Negative for dysuria.  Musculoskeletal: Negative for joint swelling.  Skin: Negative for rash.  Allergic/Immunologic: Negative.  Negative for environmental allergies, food allergies and immunocompromised state.  Neurological: Negative for headaches.  Hematological: Does not bruise/bleed easily.  Psychiatric/Behavioral: Negative for dysphoric mood. The patient is not nervous/anxious.    Past Medical History: He  has a past medical history of Allergic state (04/25/2007), Dermatitis (09/22/2011), Headache(784.0), ZOXWRUEA(540.9) (09/22/2011), Hyperlipidemia, mixed (08/11/2014), Low back pain during pregnancy in first trimester (09/06/2016), Preventative health care (04/02/2011), Sleep apnea, and Sleep apnea.  Past Surgical History: He  has no past surgical history on file.  Family History: His family history includes Cancer in his paternal grandmother; Depression in his brother; Diabetes in his mother; GER disease in his paternal grandmother; Other in his father.  Social History: He  reports that he has never smoked. He has never used smokeless tobacco. He reports that he drinks alcohol. He reports that he does not use drugs.  Medications: Allergies as of 02/16/2018   No Known Allergies     Medication List    as of 02/16/2018 11:58 AM   You have not been prescribed any medications.

## 2018-03-10 NOTE — Progress Notes (Signed)
[facility] [address] [place], [place] [address] Phone: [phone] Subjective:    I Ronelle Nigh am serving as a Neurosurgeon for Dr. Antoine Primas.   CC: Back pain follow-up  [id number]  MOUSSA WIEGAND is a 52 y.o. male coming in with complaint of back pain. States that his back is not bad today. Believes it is the same as last visit.  Patient feels that the exercises have been doing better.  Feels that the manipulation helped him significantly as well.  No radiation in the arms or any new symptoms.  Headaches are more stable as well.       Past Medical History:  Diagnosis Date  . Allergic state 04/25/2007   Seasonal allergies-well controlled with Allegra and Flonase    . Dermatitis 09/22/2011  . Headache(784.0)   . Headache(784.0) 09/22/2011  . Hyperlipidemia, mixed 08/11/2014  . Low back pain during pregnancy in first trimester 09/06/2016  . Preventative health care 04/02/2011  . Sleep apnea   . Sleep apnea    No past surgical history on file. Social History   Socioeconomic History  . Marital status: Married    Spouse name: Not on file  . Number of children: Not on file  . Years of education: Not on file  . Highest education level: Not on file  Occupational History  . Not on file  Social Needs  . Financial resource strain: Not on file  . Food insecurity:    Worry: Not on file    Inability: Not on file  . Transportation needs:    Medical: Not on file    Non-medical: Not on file  Tobacco Use  . Smoking status: Never Smoker  . Smokeless tobacco: Never Used  Substance and Sexual Activity  . Alcohol use: Yes    Comment: rare beer  . Drug use: No  . 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 Physical activity:    Days per week: Not on file    Minutes per session: Not on file  . Stress: Not on file  Relationships  . Social connections:    Talks on phone:  Not on file    Gets together: Not on file    Attends religious service: Not on file    Active member of club or organization: Not on file    Attends meetings of clubs or organizations: Not on file    Relationship status: Not on file  Other Topics Concern  . Not on file  Social History Narrative   Patient is from [place] but lives in [place] 2002, has been in the [place] since 1996, lives with wife and 2 children-ages 9 and 7, wife works from home, her office is in [place]. Remainder family lives in [place]. Patient works in Consulting civil engineer. Enjoys tennis. Vegetarian.   No Known Allergies Family History  Problem Relation Age of Onset  . Diabetes Mother        diet controlled DM  . Other Father        bowel obstruction with resection  . Cancer Paternal Grandmother        esophageal cancer  . GER disease Paternal Grandmother   . Depression Brother    No current outpatient medications on file.    Past medical history, social, surgical and family history all reviewed in electronic medical record.  No pertanent information unless stated  regarding to the chief complaint.   Review of Systems:  No  visual changes, nausea, vomiting, diarrhea, constipation, dizziness, abdominal pain, skin rash, fevers, chills, night sweats, weight loss, swollen lymph nodes, body aches, joint swelling, chest pain, shortness of breath, mood changes.  Mild positive headaches and muscle aches  Objective  Blood pressure 104/80, pulse 66, height 5\' 9"  (1.753 m), weight 180 lb (81.6 kg), SpO2 98 %.    General: No apparent distress alert and oriented x3 mood and affect normal, dressed appropriately.  HEENT: Pupils equal, extraocular movements intact  Respiratory: Patient's speak in full sentences and does not appear short of breath  Cardiovascular: No lower extremity edema, non tender, no erythema  Skin: Warm dry intact with no signs of infection or rash on extremities or on axial skeleton.  Abdomen: Soft nontender    Neuro: Cranial nerves II through XII are intact, neurovascularly intact in all extremities with 2+ DTRs and 2+ pulses.  Lymph: No lymphadenopathy of posterior or anterior cervical chain or axillae bilaterally.  Gait normal with good balance and coordination.  MSK:  Non tender with full range of motion and good stability and symmetric strength and tone of shoulders, elbows, wrist, hip, knee and ankles bilaterally.  Neck: Inspection unremarkable. No palpable stepoffs. Negative Spurling's maneuver. Full neck range of motion Grip strength and sensation normal in bilateral hands Strength good C4 to T1 distribution No sensory change to C4 to T1 Negative Hoffman sign bilaterally Reflexes normal Mild tightness in the periscapular musculature bilaterally left greater than right  Osteopathic findings C4 flexed rotated and side bent left C6 flexed rotated and side bent left T3 extended rotated and side bent left inhaled third rib T9 extended rotated and side bent left      Impression and Recommendations:     This case required medical decision making of moderate complexity. The above documentation has been reviewed and is accurate and complete Judi SaaZachary M , DO       Note: This dictation was prepared with Dragon dictation along with smaller phrase technology. Any transcriptional errors that result from this process are unintentional.

## 2018-03-13 ENCOUNTER — Encounter: Payer: Self-pay | Admitting: Family Medicine

## 2018-03-13 ENCOUNTER — Ambulatory Visit: Payer: BLUE CROSS/BLUE SHIELD | Admitting: Family Medicine

## 2018-03-13 DIAGNOSIS — M542 Cervicalgia: Secondary | ICD-10-CM

## 2018-03-13 DIAGNOSIS — M999 Biomechanical lesion, unspecified: Secondary | ICD-10-CM

## 2018-03-13 DIAGNOSIS — G2589 Other specified extrapyramidal and movement disorders: Secondary | ICD-10-CM | POA: Diagnosis not present

## 2018-03-13 NOTE — Assessment & Plan Note (Signed)
Improved continoue HEP discussed posture and ergonomics.

## 2018-03-13 NOTE — Patient Instructions (Signed)
Good to see you  You are doing great  Keep up with everything you are doing.  See me again in months  If you need me sooner call [phone]

## 2018-03-13 NOTE — Assessment & Plan Note (Signed)
Continues to have mild neck pain.  Has been doing very well though.  Discussed posture and ergonomics.  As long as patient is well to follow-up with me again in 3 months

## 2018-03-13 NOTE — Assessment & Plan Note (Signed)
Decision today to treat with OMT was based on Physical Exam  After verbal consent patient was treated with HVLA, ME, FPR techniques in cervical, thoracic, rib areas  Patient tolerated the procedure well with improvement in symptoms  Patient given exercises, stretches and lifestyle modifications  See medications in patient instructions if given  Patient will follow up in 4 weeks 

## 2018-04-28 ENCOUNTER — Encounter: Payer: Self-pay | Admitting: Pulmonary Disease

## 2018-04-28 ENCOUNTER — Ambulatory Visit: Payer: BLUE CROSS/BLUE SHIELD | Admitting: Pulmonary Disease

## 2018-04-28 VITALS — BP 110/70 | HR 78 | Ht 70.0 in | Wt 182.0 lb

## 2018-04-28 DIAGNOSIS — Z7189 Other specified counseling: Secondary | ICD-10-CM

## 2018-04-28 DIAGNOSIS — G4733 Obstructive sleep apnea (adult) (pediatric): Secondary | ICD-10-CM

## 2018-04-28 NOTE — Patient Instructions (Signed)
Can check CPAP mask options online  Follow up in 1 year

## 2018-04-28 NOTE — Progress Notes (Signed)
[facility]  Chief Complaint  Patient presents with  . Follow-up    Pt is doing well overall with new cpap machine, in need of new mask.    Constitutional:  BP 110/70 (BP Location: Left Arm, Cuff Size: Normal)   Pulse 78   Ht 5\' 10"  (1.778 m)   Wt 182 lb (82.6 kg)   SpO2 97%   BMI 26.11 kg/m   Past Medical History:  Allergies, HA, HLD  Brief Summary:  Michael Gibbs is a 52 y.o. male with obstructive sleep apnea.  He got new CPAP machine.  Working better.  Machine is more quiet.  He feels he is resting better.  Only issue is that his mask shifts sometimes.  He was using full face mask, but switched to nasal mask.  When his mask shifts at night, he feels more tired the next day.  Denies sinus congestion, sore throat, dry mouth, or aerophagia.   Physical Exam:   Appearance - well kempt   ENMT - clear nasal mucosa, midline nasal  septum, no oral exudates, no LAN, trachea midline  Respiratory - normal chest wall, normal respiratory effort, no accessory muscle use, no wheeze/rales  CV - s1s2 regular rate and rhythm, no murmurs, no peripheral edema, radial pulses symmetric  GI - soft, non tender, no masses  Lymph - no adenopathy noted in neck and axillary areas  MSK - normal gait  Ext - no cyanosis, clubbing, or joint inflammation noted  Skin - no rashes, lesions, or ulcers  Neuro - normal strength, oriented x 3  Psych - normal mood and affect  Assessment/Plan:   Obstructive sleep apnea. - he is compliant with CPAP and reports benefit from therapy - continue auto CPAP - discussed mask fit options; he will look up on line and notify if he wants to switch CPAP masks   Patient Instructions  Can check CPAP mask options online  Follow up in 1 year   Coralyn Helling, MD [facility] Pager: [phone] 04/28/2018, 9:21 AM  Flow Sheet      Sleep tests:  PSG 11/29/08 >> AHI 7.8, SpO2 low  78% Auto CPAP 03/28/18 to 04/26/18 >> used on 30 of 30 nights with average 5 hrs 44 min.  Average AHI 4.1 with median CPAP 12 and 95 th percentile CPAP 14 cm H2O  Medications:   Allergies as of 04/28/2018   No Known Allergies     Medication List    as of 04/28/2018  9:21 AM   You have not been prescribed any medications.     Past Surgical History:  He has not had prior surgeries.  Family History:  His family history includes Cancer in his paternal grandmother; Depression in his brother; Diabetes in his mother; GER disease in his paternal grandmother; Other in his father.  Social History:  He  reports that he has never smoked. He has never used smokeless tobacco. He reports that he drinks alcohol. He reports that he does not use drugs.

## 2018-05-23 DIAGNOSIS — G4733 Obstructive sleep apnea (adult) (pediatric): Secondary | ICD-10-CM

## 2018-05-23 NOTE — Telephone Encounter (Signed)
Dr. Craige CottaSood, Please review pt's mychart message and please advise on recs for pt. Thanks!

## 2018-05-23 NOTE — Telephone Encounter (Signed)
Please send order to have his DME refit his CPAP mask. 

## 2018-06-12 ENCOUNTER — Encounter: Payer: Self-pay | Admitting: Family Medicine

## 2018-06-12 NOTE — Progress Notes (Signed)
[facility] [address] [place], [place] [address] Phone: [phone] Subjective:    I Michael Gibbs am serving as a Neurosurgeonscribe for Dr. Antoine PrimasZachary Gibbs.   I'm seeing this patient by the request  of:    CC: Neck and back pain follow-up new onset ankle pain  ZHY:[id number]:Subjective  Michael Gibbs is a 52 y.o. male coming in with complaint of neck and back pain. States he is doing well. Fracture about 13 years ago in right ankle. During the winter time he gets a sharp pain in the joint line (Talus). Pain eventually goes away. No numbness and tingling to the toes.      Past Medical History:  Diagnosis Date  . Allergic state 04/25/2007   Seasonal allergies-well controlled with Allegra and Flonase    . Dermatitis 09/22/2011  . Headache(784.0)   . Headache(784.0) 09/22/2011  . Hyperlipidemia, mixed 08/11/2014  . Low back pain during pregnancy in first trimester 09/06/2016  . Preventative health care 04/02/2011  . Sleep apnea   . Sleep apnea    No past surgical history on file. Social History   Socioeconomic History  . Marital status: Married    Spouse name: Not on file  . Number of children: Not on file  . Years of education: Not on file  . Highest education level: Not on file  Occupational History  . Not on file  Social Needs  . Financial resource strain: Not on file  . Food insecurity:    Worry: Not on file    Inability: Not on file  . Transportation needs:    Medical: Not on file    Non-medical: Not on file  Tobacco Use  . Smoking status: Never Smoker  . Smokeless tobacco: Never Used  Substance and Sexual Activity  . Alcohol use: Yes    Comment: rare beer  . Drug use: No  . 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 Physical activity:    Days per week: Not on file    Minutes per session: Not on file  . Stress: Not on file  Relationships  . Social connections:    Talks  on phone: Not on file    Gets together: Not on file    Attends religious service: Not on file    Active member of club or organization: Not on file    Attends meetings of clubs or organizations: Not on file    Relationship status: Not on file  Other Topics Concern  . Not on file  Social History Narrative   Patient is from [place] but [place], has been [place] the [place] since [place], lives with wife and 2 children-ages 9 and 7, wife works from home, [place] Remainder family lives in [place]. 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 Vegetarian.   No Known Allergies Family History  Problem Relation Age of Onset  . Diabetes Mother        diet controlled DM  . Other Father        bowel obstruction with resection  . Cancer Paternal Grandmother        esophageal cancer  . GER disease Paternal Grandmother   . Depression Brother    No current outpatient medications on file.    Past medical history, social, surgical and family history all reviewed in electronic medical record.  No  pertanent information unless stated regarding to the chief complaint.   Review of Systems:  No headache, visual changes, nausea, vomiting, diarrhea, constipation, dizziness, abdominal pain, skin rash, fevers, chills, night sweats, weight loss, swollen lymph nodes, body aches, joint swelling, muscle aches, chest pain, shortness of breath, mood changes.   Objective  Blood pressure 132/80, pulse 67, height 5\' 10"  (1.778 m), weight 178 lb (80.7 kg), SpO2 98 %.    General: No apparent distress alert and oriented x3 mood and affect normal, dressed appropriately.  HEENT: Pupils equal, extraocular movements intact  Respiratory: Patient's speak in full sentences and does not appear short of breath  Cardiovascular: No lower extremity edema, non tender, no erythema  Skin: Warm dry intact with no signs of infection or rash on extremities or on axial skeleton.  Abdomen: Soft nontender  Neuro:  Cranial nerves II through XII are intact, neurovascularly intact in all extremities with 2+ DTRs and 2+ pulses.  Lymph: No lymphadenopathy of posterior or anterior cervical chain or axillae bilaterally.  Gait normal with good balance and coordination.  MSK:  Non tender with full range of motion and good stability and symmetric strength and tone of shoulders, elbows, wrist, hip, knee bilaterally.  Ankle: Right Knee swelling over the lateral aspect of the ankle Range of motion is full in all directions. Strength is 5/5 in all directions. Stable lateral and medial ligaments; squeeze test and kleiger test unremarkable; Talar dome nontender; No pain at base of 5th MT; No tenderness over cuboid; No tenderness over N spot or navicular prominence No tenderness on posterior aspects of lateral and medial malleolus As it of peroneal subluxation noted with tenderness over the peroneal area Negative tarsal tunnel tinel's Able to walk 4 steps. Contralateral ankle unremarkable  [id number]; 15 additional minutes spent for Therapeutic exercises as stated in above notes.  This included exercises focusing on stretching, strengthening, with significant focus on eccentric aspects.   Long term goals include an improvement in range of motion, strength, endurance as well as avoiding reinjury. Patient's frequency would include in 1-2 times a day, 3-5 times a week for a duration of 6-12 weeks. Ankle strengthening that included:  Basic range of motion exercises to allow proper full motion at ankle Stretching of the lower leg and hamstrings  Theraband exercises for the lower leg - inversion, eversion, dorsiflexion and plantarflexion each to be completed with a theraband Balance exercises to increase proprioception Weight bearing exercises to increase strength and balance   Proper technique shown and discussed handout in great detail with ATC.  All questions were discussed and answered.     Impression and Recommendations:       This case required medical decision making of moderate complexity. The above documentation has been reviewed and is accurate and complete Judi Saa, DO       Note: This dictation was prepared with Dragon dictation along with smaller phrase technology. Any transcriptional errors that result from this process are unintentional.

## 2018-06-13 ENCOUNTER — Encounter: Payer: Self-pay | Admitting: Family Medicine

## 2018-06-13 ENCOUNTER — Ambulatory Visit: Payer: BLUE CROSS/BLUE SHIELD | Admitting: Family Medicine

## 2018-06-13 DIAGNOSIS — IMO0001 Reserved for inherently not codable concepts without codable children: Secondary | ICD-10-CM | POA: Insufficient documentation

## 2018-06-13 DIAGNOSIS — S9304XA Dislocation of right ankle joint, initial encounter: Secondary | ICD-10-CM

## 2018-06-13 NOTE — Assessment & Plan Note (Signed)
Discussed compression, home exercise, icing regimen.  Back is doing very well otherwise.  Discussed the possibility of a heel lift that could be beneficial.  Follow-up with me again in 4 to 6 weeks

## 2018-06-13 NOTE — Patient Instructions (Signed)
Good to see you  Ice 20 minutes 2 times daily. Usually after activity and before bed. [email or web address] x-fit ankle sleeve size medium Exercises 3 times a week.  One knee down one knee up tilt pelvis forward.  Hands overhead then rotate to upper leg.  Hold 10 seconds, relax, repeat and do other side as well.  Very important when after being in a flexed position for a long amount of time.  Talk to Dr. Abner GreenspanBlyth  Consider 1 baby aspirin daily  Consider flonase daily in the nose for 2 weeks Happy holidays!  Call us [phone] when you need us

## 2018-12-08 ENCOUNTER — Encounter: Payer: BLUE CROSS/BLUE SHIELD | Admitting: Family Medicine

## 2019-04-09 ENCOUNTER — Other Ambulatory Visit: Payer: Self-pay

## 2019-04-10 ENCOUNTER — Ambulatory Visit (INDEPENDENT_AMBULATORY_CARE_PROVIDER_SITE_OTHER): Payer: BC Managed Care – PPO | Admitting: Family Medicine

## 2019-04-10 ENCOUNTER — Telehealth: Payer: Self-pay

## 2019-04-10 ENCOUNTER — Encounter: Payer: Self-pay | Admitting: Family Medicine

## 2019-04-10 VITALS — BP 118/82 | HR 75 | Temp 98.5°F | Resp 18 | Wt 177.8 lb

## 2019-04-10 DIAGNOSIS — Z23 Encounter for immunization: Secondary | ICD-10-CM

## 2019-04-10 DIAGNOSIS — Z0000 Encounter for general adult medical examination without abnormal findings: Secondary | ICD-10-CM

## 2019-04-10 DIAGNOSIS — R042 Hemoptysis: Secondary | ICD-10-CM

## 2019-04-10 DIAGNOSIS — L309 Dermatitis, unspecified: Secondary | ICD-10-CM

## 2019-04-10 DIAGNOSIS — E782 Mixed hyperlipidemia: Secondary | ICD-10-CM

## 2019-04-10 DIAGNOSIS — R17 Unspecified jaundice: Secondary | ICD-10-CM

## 2019-04-10 LAB — LIPID PANEL
Cholesterol: 184 mg/dL (ref 0–200)
HDL: 34.6 mg/dL — ABNORMAL LOW (ref >39.00)
LDL Cholesterol: 122 mg/dL — ABNORMAL HIGH (ref 0–99)
NonHDL: 149.11
Total CHOL/HDL Ratio: 5
Triglycerides: 137 mg/dL (ref 0.0–149.0)
VLDL: 27.4 mg/dL (ref 0.0–40.0)

## 2019-04-10 LAB — COMPREHENSIVE METABOLIC PANEL
ALT: 13 U/L (ref 0–53)
AST: 14 U/L (ref 0–37)
Albumin: 4.4 g/dL (ref 3.5–5.2)
Alkaline Phosphatase: 81 U/L (ref 39–117)
BUN: 11 mg/dL (ref 6–23)
CO2: 32 mEq/L (ref 19–32)
Calcium: 9.4 mg/dL (ref 8.4–10.5)
Chloride: 105 mEq/L (ref 96–112)
Creatinine, Ser: 0.81 mg/dL (ref 0.40–1.50)
GFR: 99.2 mL/min (ref >60.00)
Glucose, Bld: 86 mg/dL (ref 70–99)
Potassium: 4.3 mEq/L (ref 3.5–5.1)
Sodium: 142 mEq/L (ref 135–145)
Total Bilirubin: 1.9 mg/dL — ABNORMAL HIGH (ref 0.2–1.2)
Total Protein: 6.9 g/dL (ref 6.0–8.3)

## 2019-04-10 LAB — CBC
HCT: 40.9 % (ref 39.0–52.0)
Hemoglobin: 13.8 g/dL (ref 13.0–17.0)
MCHC: 33.8 g/dL (ref 30.0–36.0)
MCV: 83.3 fl (ref 78.0–100.0)
Platelets: 237 10*3/uL (ref 150.0–400.0)
RBC: 4.91 Mil/uL (ref 4.22–5.81)
RDW: 13.3 % (ref 11.5–15.5)
WBC: 6.1 10*3/uL (ref 4.0–10.5)

## 2019-04-10 LAB — TSH: TSH: 1.45 u[IU]/mL (ref 0.35–4.50)

## 2019-04-10 LAB — PSA: PSA: 0.66 ng/mL (ref 0.10–4.00)

## 2019-04-10 MED ORDER — TRIAMCINOLONE ACETONIDE 0.1 % EX OINT: 1.0000 "application " | TOPICAL_OINTMENT | Freq: Two times a day (BID) | CUTANEOUS | 1 refills | Status: DC | PRN

## 2019-04-10 NOTE — Assessment & Plan Note (Addendum)
Patient encouraged to maintain heart healthy diet, regular exercise, adequate sleep. Consider daily probiotics. Take medications as prescribed. Labs ordered and reviewed 

## 2019-04-10 NOTE — Progress Notes (Signed)
[name], male   DOB: Oct 07, 1965, 53 y.o.   MRN: [id number]   Subjective:    [name], male    DOB: 09-17-1965, 53 y.o.   MRN: [id number]  No chief complaint on file.   HPI Patient is in today for annual preventative exam and overall is doing well. No recent febrile illness or hospitalizations. No recent injury. He does note some intermittent blood in sputum. Very scant and infrequent. Often after coughing. Happens in am one to two times a year and has been happening off and on for 15 years or so.  No associated sore throat etc. He has also noted some dermatitis behind his ears occasionally more on right than left. He is working full time but he has no concerns about their masking protocols. Denies CP/palp/SOB/HA/congestion/fevers/GI or GU c/o. Taking meds as prescribed. He is trying to maintain a heart healthy diet and stay active.   Past Medical History:  Diagnosis Date  . Allergic state 04/25/2007   Seasonal allergies-well controlled with Allegra and Flonase    . Dermatitis 09/22/2011  . Headache(784.0)   . Headache(784.0) 09/22/2011  . Hyperlipidemia, mixed 08/11/2014  . Low back pain during pregnancy in first trimester 09/06/2016  . Preventative health care 04/02/2011  . Sleep apnea   . Sleep apnea     No past surgical history on file.  Family History  Problem Relation Age of Onset  . Diabetes Mother        diet controlled DM  . Other Father        bowel obstruction with resection  . Cancer Paternal Grandmother        esophageal cancer  . GER disease Paternal Grandmother   . Depression Brother     Social History   Socioeconomic History  . Marital status: Married    Spouse name: Not on file  . Number of children: Not on file  . Years of education: Not on file  . Highest education level: Not on file  Occupational History  . Not on file  Social Needs  . Financial resource strain: Not on file  . Food insecurity   Worry: Not on file    Inability: Not on file  . Transportation needs    Medical: Not on file    Non-medical: Not on file  Tobacco Use  . Smoking status: Never Smoker  . Smokeless tobacco: Never Used  Substance and Sexual Activity  . Alcohol use: Yes    Comment: rare beer  . Drug use: No  . 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 Physical activity    Days per week: Not on file    Minutes per session: Not on file  . Stress: Not on file  Relationships  . Social Musician on phone: Not on file    Gets together: Not on file    Attends religious service: Not on file    Active member of club or organization: Not on file    Attends meetings of clubs or organizations: Not on file    Relationship status: Not on file  . Intimate partner violence    Fear of current or ex partner: Not on file    Emotionally abused: Not on file    Physically abused: Not on file    Forced sexual activity: Not on file  Other Topics Concern  .  Not on file  Social History Narrative   Patient is from [place], has been in the [place], lives with wife and 2 children-ages 9 and 7, wife works from home, her office [place] in [place]. Remainder family lives in [place]. 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 Vegetarian.    No outpatient medications prior to visit.   No facility-administered medications prior to visit.     No Known Allergies  Review of Systems  Constitutional: Negative for chills, fever and malaise/fatigue.  HENT: Negative for congestion and hearing loss.   Eyes: Negative for discharge.  Respiratory: Negative for cough, sputum production and shortness of breath.   Cardiovascular: Negative for chest pain, palpitations and leg swelling.  Gastrointestinal: Negative for abdominal pain, blood in stool, constipation, diarrhea, heartburn, nausea and vomiting.  Genitourinary: Negative  for dysuria, frequency, hematuria and urgency.  Musculoskeletal: Negative for back pain, falls and myalgias.  Skin: Negative for rash.  Neurological: Negative for dizziness, sensory change, loss of consciousness, weakness and headaches.  Endo/Heme/Allergies: Negative for environmental allergies. Does not bruise/bleed easily.  Psychiatric/Behavioral: Negative for depression and suicidal ideas. The patient is not nervous/anxious and does not have insomnia.        Objective:    Physical Exam Vitals signs and nursing note reviewed.  Constitutional:      General: He is not in acute distress.    Appearance: He is well-developed.  HENT:     Head: Normocephalic and atraumatic.     Right Ear: Tympanic membrane, ear canal and external ear normal.     Left Ear: Tympanic membrane, ear canal and external ear normal.     Nose: Nose normal.  Eyes:     General:        Right eye: No discharge.        Left eye: No discharge.  Neck:     Musculoskeletal: Normal range of motion and neck supple.  Cardiovascular:     Rate and Rhythm: Normal rate and regular rhythm.     Heart sounds: No murmur.  Pulmonary:     Effort: Pulmonary effort is normal.     Breath sounds: Normal breath sounds.  Abdominal:     General: Bowel sounds are normal.     Palpations: Abdomen is soft.     Tenderness: There is no abdominal tenderness.  Skin:    General: Skin is warm and dry.  Neurological:     Mental Status: He is alert and oriented to person, place, and time.     BP 118/82 (BP Location: Left Arm, Patient Position: Sitting, Cuff Size: Normal)   Pulse 75   Temp 98.5 F (36.9 C) (Temporal)   Resp 18   Wt 177 lb 12.8 oz (80.6 kg)   SpO2 98%   BMI 25.51 kg/m  Wt Readings from Last 3 Encounters:  04/10/19 177 lb 12.8 oz (80.6 kg)  06/13/18 178 lb (80.7 kg)  04/28/18 182 lb (82.6 kg)    Diabetic Foot Exam - Simple   No data filed     Lab Results  Component Value Date   WBC 7.0 12/06/2017   HGB 14.5  12/06/2017   HCT 42.4 12/06/2017   PLT 261.0 12/06/2017   GLUCOSE 89 09/06/2016   CHOL 160 09/06/2016   TRIG 136.0 09/06/2016   HDL 31.30 (L) 09/06/2016   LDLDIRECT 110 (H) 04/25/2007   LDLCALC 102 (H) 09/06/2016   ALT 12 09/06/2016   AST 14 09/06/2016  NA 140 09/06/2016   K 4.0 09/06/2016   CL 107 09/06/2016   CREATININE 0.82 09/06/2016   BUN 9 09/06/2016   CO2 29 09/06/2016   TSH 2.68 12/06/2017   PSA 0.60 12/06/2017    Lab Results  Component Value Date   TSH 2.68 12/06/2017   Lab Results  Component Value Date   WBC 7.0 12/06/2017   HGB 14.5 12/06/2017   HCT 42.4 12/06/2017   MCV 81.7 12/06/2017   PLT 261.0 12/06/2017   Lab Results  Component Value Date   NA 140 09/06/2016   K 4.0 09/06/2016   CO2 29 09/06/2016   GLUCOSE 89 09/06/2016   BUN 9 09/06/2016   CREATININE 0.82 09/06/2016   BILITOT 0.9 09/06/2016   ALKPHOS 87 09/06/2016   AST 14 09/06/2016   ALT 12 09/06/2016   PROT 6.9 09/06/2016   ALBUMIN 4.2 09/06/2016   CALCIUM 9.1 09/06/2016   GFR 104.98 09/06/2016   Lab Results  Component Value Date   CHOL 160 09/06/2016   Lab Results  Component Value Date   HDL 31.30 (L) 09/06/2016   Lab Results  Component Value Date   LDLCALC 102 (H) 09/06/2016   Lab Results  Component Value Date   TRIG 136.0 09/06/2016   Lab Results  Component Value Date   CHOLHDL 5 09/06/2016   No results found for: HGBA1C     Assessment & Plan:   Problem List Items Addressed This Visit    Preventative health care    Patient encouraged to maintain heart healthy diet, regular exercise, adequate sleep. Consider daily probiotics. Take medications as prescribed. Labs ordered and reviewed      Relevant Orders   CBC   Comprehensive metabolic panel   TSH   PSA   Dermatitis    Behind the ear rash intermittent. Clean with [place] then apply Triamcinolone ointment 0.1% small amount twice daily as needed.      Hyperlipidemia, mixed    Encouraged heart  healthy diet, increase exercise, avoid trans fats, consider a krill oil cap daily      Relevant Orders   Comprehensive metabolic panel   Lipid panel   Blood in sputum    Very scant and infrequent. Often after coughing. Happens in am one to two times a year and has been happening off and on for 15 years or so.  No associated sore throat etc. He will report if worsens and encouraged to maintain good hydration and if he decides he is ready for ENT evaluation to visualize his oropharynx and nasopharynx he will let us know.        Other Visit Diagnoses    Needs flu shot    -  Primary   Relevant Orders   Flu Vaccine QUAD 6+ mos PF IM (Fluarix Quad PF) (Completed)      I am having Natale R. Wismer start on triamcinolone ointment.  Meds ordered this encounter  Medications  . triamcinolone ointment (KENALOG) 0.1 %    Sig: Apply 1 application topically 2 (two) times daily as needed.    Dispense:  80 g    Refill:  1     Penni Homans, MD

## 2019-04-10 NOTE — Assessment & Plan Note (Signed)
Behind the ear rash intermittent. Clean with [place] then apply Triamcinolone ointment 0.1% small amount twice daily as needed.

## 2019-04-10 NOTE — Telephone Encounter (Signed)
° °  Pt called back and said he will repeat the cologuard

## 2019-04-10 NOTE — Assessment & Plan Note (Signed)
Encouraged heart healthy diet, increase exercise, avoid trans fats, consider a krill oil cap daily 

## 2019-04-10 NOTE — Assessment & Plan Note (Signed)
Very scant and infrequent. Often after coughing. Happens in am one to two times a year and has been happening off and on for 15 years or so.  No associated sore throat etc. He will report if worsens and encouraged to maintain good hydration and if he decides he is ready for ENT evaluation to visualize his oropharynx and nasopharynx he will let us know.

## 2019-04-10 NOTE — Telephone Encounter (Signed)
Per Dr. Charlett Blake patient had cologuard in 2017 and I missed it was due again. check and see if he wants to repeat or move onto colonoscopy.  Called patient left message for patient to call the office back

## 2019-04-10 NOTE — Patient Instructions (Addendum)
[place] Blood pressure, upper arm cuff   Pulse oximeter, want oxygen in the 90s  Weekly vitals  W[place] Hazel Astringent then apply Triamcinolone ointment     Preventive Care 61-54 Years Old, Male Preventive care refers to lifestyle choices and visits with your health care provider that can promote health and wellness. This includes:  A yearly physical exam. This is also called an annual well check.  Regular dental and eye exams.  Immunizations.  Screening for certain conditions.  Healthy lifestyle choices, such as eating a healthy diet, getting regular exercise, not using drugs or products that contain nicotine and tobacco, and limiting alcohol use. What can I expect for my preventive care visit? Physical exam Your health care provider will check:  Height and weight. These may be used to calculate body mass index (BMI), which is a measurement that tells if you are at a healthy weight.  Heart rate and blood pressure.  Your skin for abnormal spots. Counseling Your health care provider may ask you questions about:  Alcohol, tobacco, and drug use.  Emotional well-being.  Home and relationship well-being.  Sexual activity.  Eating habits.  Work and work Statistician. What immunizations do I need?  Influenza (flu) vaccine  This is recommended every year. Tetanus, diphtheria, and pertussis (Tdap) vaccine  You may need a Td booster every 10 years. Varicella (chickenpox) vaccine  You may need this vaccine if you have not already been vaccinated. Zoster (shingles) vaccine  You may need this after age 43. Measles, mumps, and rubella (MMR) vaccine  You may need at least one dose of MMR if you were born in 1957 or later. You may also need a second dose. Pneumococcal conjugate (PCV13) vaccine  You may need this if you have certain conditions and were not previously vaccinated. Pneumococcal polysaccharide (PPSV23) vaccine  You may need one or two doses if you smoke  cigarettes or if you have certain conditions. Meningococcal conjugate (MenACWY) vaccine  You may need this if you have certain conditions. Hepatitis A vaccine  You may need this if you have certain conditions or if you travel or work in places where you may be exposed to hepatitis A. Hepatitis B vaccine  You may need this if you have certain conditions or if you travel or work in places where you may be exposed to hepatitis B. Haemophilus influenzae type b (Hib) vaccine  You may need this if you have certain risk factors. Human papillomavirus (HPV) vaccine  If recommended by your health care provider, you may need three doses over 6 months. You may receive vaccines as individual doses or as more than one vaccine together in one shot (combination vaccines). Talk with your health care provider about the risks and benefits of combination vaccines. What tests do I need? Blood tests  Lipid and cholesterol levels. These may be checked every 5 years, or more frequently if you are over 21 years old.  Hepatitis C test.  Hepatitis B test. Screening  Lung cancer screening. You may have this screening every year starting at age 77 if you have a 30-pack-year history of smoking and currently smoke or have quit within the past 15 years.  Prostate cancer screening. Recommendations will vary depending on your family history and other risks.  Colorectal cancer screening. All adults should have this screening starting at age 63 and continuing until age 30. Your health care provider may recommend screening at age 38 if you are at increased risk. You will have tests  every 1-10 years, depending on your results and the type of screening test.  Diabetes screening. This is done by checking your blood sugar (glucose) after you have not eaten for a while (fasting). You may have this done every 1-3 years.  Sexually transmitted disease (STD) testing. Follow these instructions at home: Eating and drinking   Eat a diet that includes fresh fruits and vegetables, whole grains, lean protein, and low-fat dairy products.  Take vitamin and mineral supplements as recommended by your health care provider.  Do not drink alcohol if your health care provider tells you not to drink.  If you drink alcohol: ? Limit how much you have to 0-2 drinks a day. ? Be aware of how much alcohol is in your drink. In the [place] one drink equals one 12 oz bottle of beer (355 mL), one 5 oz glass of wine (148 mL), or one 1 oz glass of hard liquor (44 mL). Lifestyle  Take daily care of your teeth and gums.  Stay active. Exercise for at least 30 minutes on 5 or more days each week.  Do not use any products that contain nicotine or tobacco, such as cigarettes, e-cigarettes, and chewing tobacco. If you need help quitting, ask your health care provider.  If you are sexually active, practice safe sex. Use a condom or other form of protection to prevent STIs (sexually transmitted infections).  Talk with your health care provider about taking a low-dose aspirin every day starting at age 34. What's next?  Go to your health care provider once a year for a well check visit.  Ask your health care provider how often you should have your eyes and teeth checked.  Stay up to date on all vaccines. This information is not intended to replace advice given to you by your health care provider. Make sure you discuss any questions you have with your health care provider. Document Released: 07/11/2015 Document Revised: 06/08/2018 Document Reviewed: 06/08/2018 Elsevier Patient Education  2020 [employer].

## 2019-04-11 NOTE — Addendum Note (Signed)
Addended by: Magdalene Molly A on: 04/11/2019 12:36 PM   Modules accepted: Orders

## 2019-05-16 ENCOUNTER — Other Ambulatory Visit: Payer: Self-pay

## 2019-05-16 ENCOUNTER — Other Ambulatory Visit (INDEPENDENT_AMBULATORY_CARE_PROVIDER_SITE_OTHER): Payer: BC Managed Care – PPO

## 2019-05-16 DIAGNOSIS — R17 Unspecified jaundice: Secondary | ICD-10-CM | POA: Diagnosis not present

## 2019-05-16 LAB — COMPREHENSIVE METABOLIC PANEL
ALT: 16 U/L (ref 0–53)
AST: 16 U/L (ref 0–37)
Albumin: 4.3 g/dL (ref 3.5–5.2)
Alkaline Phosphatase: 88 U/L (ref 39–117)
BUN: 12 mg/dL (ref 6–23)
CO2: 29 mEq/L (ref 19–32)
Calcium: 9.4 mg/dL (ref 8.4–10.5)
Chloride: 103 mEq/L (ref 96–112)
Creatinine, Ser: 0.88 mg/dL (ref 0.40–1.50)
GFR: 90.11 mL/min (ref >60.00)
Glucose, Bld: 113 mg/dL — ABNORMAL HIGH (ref 70–99)
Potassium: 4.6 mEq/L (ref 3.5–5.1)
Sodium: 140 mEq/L (ref 135–145)
Total Bilirubin: 1.6 mg/dL — ABNORMAL HIGH (ref 0.2–1.2)
Total Protein: 6.9 g/dL (ref 6.0–8.3)

## 2020-04-15 ENCOUNTER — Encounter: Payer: Self-pay | Admitting: Family Medicine

## 2020-04-15 ENCOUNTER — Ambulatory Visit: Payer: No Typology Code available for payment source | Admitting: Family Medicine

## 2020-04-15 ENCOUNTER — Other Ambulatory Visit: Payer: Self-pay

## 2020-04-15 VITALS — BP 110/78 | HR 69 | Temp 97.9°F | Wt 171.6 lb

## 2020-04-15 DIAGNOSIS — L309 Dermatitis, unspecified: Secondary | ICD-10-CM | POA: Diagnosis not present

## 2020-04-15 DIAGNOSIS — E782 Mixed hyperlipidemia: Secondary | ICD-10-CM | POA: Diagnosis not present

## 2020-04-15 DIAGNOSIS — Z0000 Encounter for general adult medical examination without abnormal findings: Secondary | ICD-10-CM

## 2020-04-15 DIAGNOSIS — G4733 Obstructive sleep apnea (adult) (pediatric): Secondary | ICD-10-CM | POA: Diagnosis not present

## 2020-04-15 DIAGNOSIS — Z23 Encounter for immunization: Secondary | ICD-10-CM | POA: Diagnosis not present

## 2020-04-15 DIAGNOSIS — M545 Low back pain, unspecified: Secondary | ICD-10-CM | POA: Diagnosis not present

## 2020-04-15 LAB — URINALYSIS
Bilirubin Urine: NEGATIVE
Glucose, UA: NEGATIVE
Hgb urine dipstick: NEGATIVE
Ketones, ur: NEGATIVE
Leukocytes,Ua: NEGATIVE
Nitrite: NEGATIVE
Protein, ur: NEGATIVE
Specific Gravity, Urine: 1.007 (ref 1.001–1.03)
pH: 6 (ref 5.0–8.0)

## 2020-04-15 MED ORDER — TRIAMCINOLONE ACETONIDE 0.1 % EX OINT
1.0000 | TOPICAL_OINTMENT | Freq: Two times a day (BID) | CUTANEOUS | 1 refills | Status: DC | PRN
Start: 2020-04-15 — End: 2021-04-16

## 2020-04-15 MED ORDER — TIZANIDINE HCL 2 MG PO TABS: 1.0000 mg | ORAL_TABLET | Freq: Every evening | ORAL | 1 refills | Status: DC | PRN

## 2020-04-15 NOTE — Assessment & Plan Note (Signed)
Patient encouraged to maintain heart healthy diet, regular exercise, adequate sleep. Consider daily probiotics. Take medications as prescribed. Labs ordered and revoewed

## 2020-04-15 NOTE — Patient Instructions (Signed)
Preventive Care 55-55 Years Old, Male Preventive care refers to lifestyle choices and visits with your health care provider that can promote health and wellness. This includes:  A yearly physical exam. This is also called an annual well check.  Regular dental and eye exams.  Immunizations.  Screening for certain conditions.  Healthy lifestyle choices, such as eating a healthy diet, getting regular exercise, not using drugs or products that contain nicotine and tobacco, and limiting alcohol use. What can I expect for my preventive care visit? Physical exam Your health care provider will check:  Height and weight. These may be used to calculate body mass index (BMI), which is a measurement that tells if you are at a healthy weight.  Heart rate and blood pressure.  Your skin for abnormal spots. Counseling Your health care provider may ask you questions about:  Alcohol, tobacco, and drug use.  Emotional well-being.  Home and relationship well-being.  Sexual activity.  Eating habits.  Work and work Statistician. What immunizations do I need?  Influenza (flu) vaccine  This is recommended every year. Tetanus, diphtheria, and pertussis (Tdap) vaccine  You may need a Td booster every 10 years. Varicella (chickenpox) vaccine  You may need this vaccine if you have not already been vaccinated. Zoster (shingles) vaccine  You may need this after age 14. Measles, mumps, and rubella (MMR) vaccine  You may need at least one dose of MMR if you were born in 1957 or later. You may also need a second dose. Pneumococcal conjugate (PCV13) vaccine  You may need this if you have certain conditions and were not previously vaccinated. Pneumococcal polysaccharide (PPSV23) vaccine  You may need one or two doses if you smoke cigarettes or if you have certain conditions. Meningococcal conjugate (MenACWY) vaccine  You may need this if you have certain conditions. Hepatitis A  vaccine  You may need this if you have certain conditions or if you travel or work in places where you may be exposed to hepatitis A. Hepatitis B vaccine  You may need this if you have certain conditions or if you travel or work in places where you may be exposed to hepatitis B. Haemophilus influenzae type b (Hib) vaccine  You may need this if you have certain risk factors. Human papillomavirus (HPV) vaccine  If recommended by your health care provider, you may need three doses over 6 months. You may receive vaccines as individual doses or as more than one vaccine together in one shot (combination vaccines). Talk with your health care provider about the risks and benefits of combination vaccines. What tests do I need? Blood tests  Lipid and cholesterol levels. These may be checked every 5 years, or more frequently if you are over 25 years old.  Hepatitis C test.  Hepatitis B test. Screening  Lung cancer screening. You may have this screening every year starting at age 30 if you have a 30-pack-year history of smoking and currently smoke or have quit within the past 15 years.  Prostate cancer screening. Recommendations will vary depending on your family history and other risks.  Colorectal cancer screening. All adults should have this screening starting at age 55 and continuing until age 55. Your health care provider may recommend screening at age 55 if you are at increased risk. You will have tests every 1-10 years, depending on your results and the type of screening test.  Diabetes screening. This is done by checking your blood sugar (glucose) after you have not  eaten for a while (fasting). You may have this done every 1-3 years.  Sexually transmitted disease (STD) testing. Follow these instructions at home: Eating and drinking  Eat a diet that includes fresh fruits and vegetables, whole grains, lean protein, and low-fat dairy products.  Take vitamin and mineral supplements as  recommended by your health care provider.  Do not drink alcohol if your health care provider tells you not to drink.  If you drink alcohol: ? Limit how much you have to 0-2 drinks a day. ? Be aware of how much alcohol is in your drink. In the [place] one drink equals one 12 oz bottle of beer (355 mL), one 5 oz glass of wine (148 mL), or one 1 oz glass of hard liquor (44 mL). Lifestyle  Take daily care of your teeth and gums.  Stay active. Exercise for at least 30 minutes on 5 or more days each week.  Do not use any products that contain nicotine or tobacco, such as cigarettes, e-cigarettes, and chewing tobacco. If you need help quitting, ask your health care provider.  If you are sexually active, practice safe sex. Use a condom or other form of protection to prevent STIs (sexually transmitted infections).  Talk with your health care provider about taking a low-dose aspirin every day starting at age 55. What's next?  Go to your health care provider once a year for a well check visit.  Ask your health care provider how often you should have your eyes and teeth checked.  Stay up to date on all vaccines. This information is not intended to replace advice given to you by your health care provider. Make sure you discuss any questions you have with your health care provider. Document Revised: 06/08/2018 Document Reviewed: 06/08/2018 Elsevier Patient Education  2020 [employer].

## 2020-04-16 LAB — LIPID PANEL
Cholesterol: 197 mg/dL (ref <200)
HDL: 38 mg/dL — ABNORMAL LOW (ref >=40)
LDL Cholesterol (Calc): 132 mg/dL (calc) — ABNORMAL HIGH
Non-HDL Cholesterol (Calc): 159 mg/dL (calc) — ABNORMAL HIGH (ref <130)
Total CHOL/HDL Ratio: 5.2 (calc) — ABNORMAL HIGH (ref <5.0)
Triglycerides: 156 mg/dL — ABNORMAL HIGH (ref <150)

## 2020-04-16 LAB — COMPREHENSIVE METABOLIC PANEL
AG Ratio: 1.8 (calc) (ref 1.0–2.5)
ALT: 13 U/L (ref 9–46)
AST: 15 U/L (ref 10–35)
Albumin: 4.6 g/dL (ref 3.6–5.1)
Alkaline phosphatase (APISO): 90 U/L (ref 35–144)
BUN: 11 mg/dL (ref 7–25)
CO2: 30 mmol/L (ref 20–32)
Calcium: 9.9 mg/dL (ref 8.6–10.3)
Chloride: 103 mmol/L (ref 98–110)
Creat: 0.88 mg/dL (ref 0.70–1.33)
Globulin: 2.6 g/dL (calc) (ref 1.9–3.7)
Glucose, Bld: 81 mg/dL (ref 65–99)
Potassium: 4.5 mmol/L (ref 3.5–5.3)
Sodium: 140 mmol/L (ref 135–146)
Total Bilirubin: 1.6 mg/dL — ABNORMAL HIGH (ref 0.2–1.2)
Total Protein: 7.2 g/dL (ref 6.1–8.1)

## 2020-04-16 LAB — TSH: TSH: 2.38 mIU/L (ref 0.40–4.50)

## 2020-04-16 LAB — CBC
HCT: 45 % (ref 38.5–50.0)
Hemoglobin: 15.1 g/dL (ref 13.2–17.1)
MCH: 27.7 pg (ref 27.0–33.0)
MCHC: 33.6 g/dL (ref 32.0–36.0)
MCV: 82.6 fL (ref 80.0–100.0)
MPV: 10.9 fL (ref 7.5–12.5)
Platelets: 263 10*3/uL (ref 140–400)
RBC: 5.45 10*6/uL (ref 4.20–5.80)
RDW: 12.7 % (ref 11.0–15.0)
WBC: 7 10*3/uL (ref 3.8–10.8)

## 2020-04-16 LAB — PSA: PSA: 0.38 ng/mL (ref <=4.0)

## 2020-04-16 NOTE — Progress Notes (Signed)
[name], male   DOB: 29-Jul-1965, 54 y.o.   MRN: [id number]   Subjective:    [name], male    DOB: July 08, 1965, 54 y.o.   MRN: [id number]  Chief Complaint  Patient presents with  . Annual Exam    HPI Patient is in today for annual preventative exam. No recent febrile illness or hospitalizations. No troublesome recent concerns. He has worked full time thru the pandemic and he feels well. He tries to maintain a heart healthy diet and regular exercise. Denies CP/palp/SOB/HA/congestion/fevers/GI or GU c/o. Taking meds as prescribed  Past Medical History:  Diagnosis Date  . Allergic state 04/25/2007   Seasonal allergies-well controlled with Allegra and Flonase    . Dermatitis 09/22/2011  . Headache(784.0)   . Headache(784.0) 09/22/2011  . Hyperlipidemia, mixed 08/11/2014  . Low back pain during pregnancy in first trimester 09/06/2016  . Preventative health care 04/02/2011  . Sleep apnea   . Sleep apnea     No past surgical history on file.  Family History  Problem Relation Age of Onset  . Diabetes Mother        diet controlled DM  . Other Father        bowel obstruction with resection  . Cancer Paternal Grandmother        esophageal cancer  . GER disease Paternal Grandmother   . Depression Brother     Social History   Socioeconomic History  . Marital status: Married    Spouse name: Not on file  . Number of children: Not on file  . Years of education: Not on file  . Highest education level: Not on file  Occupational History  . Not on file  Tobacco Use  . Smoking status: Never Smoker  . Smokeless tobacco: Never Used  Substance and Sexual Activity  . Alcohol use: Yes    Comment: rare beer  . Drug use: No  . Sexual activity: Yes    Comment: lives with wife,  works in IT, vegetarian, wears a seat baelt  Other Topics Concern  . Not on file  Social History Narrative   Patient is from [place] but lives in [place]  [place], has been in the [place] since 1996, lives with wife and 2 children-ages 9 and 7, wife works from home, her office is in [place]. Remainder family lives in [place]. Patient works in Consulting civil engineer. Enjoys tennis. Vegetarian.   Social Determinants of Health   Financial Resource Strain:   . Difficulty of Paying Living Expenses: Not on file  Food Insecurity:   . Worried About Programme researcher, broadcasting/film/video in the Last Year: Not on file  . Ran Out of Food in the Last Year: Not on file  Transportation Needs:   . Lack of Transportation (Medical): Not on file  . Lack of Transportation (Non-Medical): Not on file  Physical Activity:   . Days of Exercise per Week: Not on file  . Minutes of Exercise per Session: Not on file  Stress:   . Feeling of Stress : Not on file  Social Connections:   . Frequency of Communication with Friends and Family: Not on file  . Frequency of Social Gatherings with Friends and Family: Not on file  . Attends Religious Services: Not on file  . Active Member of Clubs or Organizations: Not on file  . Attends Banker Meetings: Not on file  . Marital Status: Not on file  Intimate Partner Violence:   .  Fear of Current or Ex-Partner: Not on file  . Emotionally Abused: Not on file  . Physically Abused: Not on file  . Sexually Abused: Not on file    Outpatient Medications Prior to Visit  Medication Sig Dispense Refill  . triamcinolone ointment (KENALOG) 0.1 % Apply 1 application topically 2 (two) times daily as needed. 80 g 1   No facility-administered medications prior to visit.    No Known Allergies  Review of Systems  Constitutional: Negative for chills, fever and malaise/fatigue.  HENT: Negative for congestion and hearing loss.   Eyes: Negative for discharge.  Respiratory: Negative for cough, sputum production and shortness of breath.   Cardiovascular: Negative for chest pain, palpitations and leg swelling.  Gastrointestinal: Negative for abdominal pain, blood in  stool, constipation, diarrhea, heartburn, nausea and vomiting.  Genitourinary: Negative for dysuria, frequency, hematuria and urgency.  Musculoskeletal: Positive for back pain. Negative for falls and myalgias.  Skin: Negative for rash.  Neurological: Negative for dizziness, sensory change, loss of consciousness, weakness and headaches.  Endo/Heme/Allergies: Negative for environmental allergies. Does not bruise/bleed easily.  Psychiatric/Behavioral: Negative for depression and suicidal ideas. The patient is not nervous/anxious and does not have insomnia.        Objective:    Physical Exam Vitals and nursing note reviewed.  Constitutional:      General: He is not in acute distress.    Appearance: Normal appearance. He is well-developed. He is not ill-appearing.  HENT:     Head: Normocephalic and atraumatic.     Right Ear: Tympanic membrane and external ear normal.     Left Ear: Tympanic membrane and external ear normal.     Nose: Nose normal.  Eyes:     General:        Right eye: No discharge.        Left eye: No discharge.  Cardiovascular:     Rate and Rhythm: Normal rate and regular rhythm.     Heart sounds: Normal heart sounds. No murmur heard.   Pulmonary:     Effort: Pulmonary effort is normal.     Breath sounds: Normal breath sounds.  Abdominal:     General: Bowel sounds are normal.     Palpations: Abdomen is soft. There is no mass.     Tenderness: There is no abdominal tenderness.  Musculoskeletal:     Cervical back: Normal range of motion and neck supple.  Skin:    General: Skin is warm and dry.  Neurological:     Mental Status: He is alert and oriented to person, place, and time.     BP 110/78 (BP Location: Left Arm, Patient Position: Sitting, Cuff Size: Large)   Pulse 69   Temp 97.9 F (36.6 C)   Wt 171 lb 9.6 oz (77.8 kg)   SpO2 97%   BMI 24.62 kg/m  Wt Readings from Last 3 Encounters:  04/15/20 171 lb 9.6 oz (77.8 kg)  04/10/19 177 lb 12.8 oz (80.6  kg)  06/13/18 178 lb (80.7 kg)    Diabetic Foot Exam - Simple   No data filed     Lab Results  Component Value Date   WBC 7.0 04/15/2020   HGB 15.1 04/15/2020   HCT 45.0 04/15/2020   PLT 263 04/15/2020   GLUCOSE 81 04/15/2020   CHOL 197 04/15/2020   TRIG 156 (H) 04/15/2020   HDL 38 (L) 04/15/2020   LDLDIRECT 110 (H) 04/25/2007   LDLCALC 132 (H) 04/15/2020   ALT  13 04/15/2020   AST 15 04/15/2020   NA 140 04/15/2020   K 4.5 04/15/2020   CL 103 04/15/2020   CREATININE 0.88 04/15/2020   BUN 11 04/15/2020   CO2 30 04/15/2020   TSH 2.38 04/15/2020   PSA 0.38 04/15/2020    Lab Results  Component Value Date   TSH 2.38 04/15/2020   Lab Results  Component Value Date   WBC 7.0 04/15/2020   HGB 15.1 04/15/2020   HCT 45.0 04/15/2020   MCV 82.6 04/15/2020   PLT 263 04/15/2020   Lab Results  Component Value Date   NA 140 04/15/2020   K 4.5 04/15/2020   CO2 30 04/15/2020   GLUCOSE 81 04/15/2020   BUN 11 04/15/2020   CREATININE 0.88 04/15/2020   BILITOT 1.6 (H) 04/15/2020   ALKPHOS 88 05/16/2019   AST 15 04/15/2020   ALT 13 04/15/2020   PROT 7.2 04/15/2020   ALBUMIN 4.3 05/16/2019   CALCIUM 9.9 04/15/2020   GFR 90.11 05/16/2019   Lab Results  Component Value Date   CHOL 197 04/15/2020   Lab Results  Component Value Date   HDL 38 (L) 04/15/2020   Lab Results  Component Value Date   LDLCALC 132 (H) 04/15/2020   Lab Results  Component Value Date   TRIG 156 (H) 04/15/2020   Lab Results  Component Value Date   CHOLHDL 5.2 (H) 04/15/2020   No results found for: HGBA1C     Assessment & Plan:   Problem List Items Addressed This Visit    Obstructive sleep apnea    He has lost some weight and his wife says he no longer snores. He was having trouble with his cpap so he sopped using it. His wife reports he is no longer snoring. They will report if that changes.       Preventative health care - Primary    Patient encouraged to maintain heart healthy diet,  regular exercise, adequate sleep. Consider daily probiotics. Take medications as prescribed. Labs ordered and revoewed      Relevant Orders   CBC (Completed)   Comprehensive metabolic panel (Completed)   PSA (Completed)   TSH (Completed)   Urinalysis (Completed)   Dermatitis    Given a refill on Triamcinolone to use prn on his neck      Hyperlipidemia, mixed    Encouraged heart healthy diet, increase exercise, avoid trans fats, consider a krill oil cap daily      Relevant Orders   Lipid panel (Completed)   Low back pain    Encouraged moist heat and gentle stretching as tolerated. May try NSAIDs and prescription meds as directed and report if symptoms worsen or seek immediate care. Also given Tizanidine to take prn.       Relevant Medications   tiZANidine (ZANAFLEX) 2 MG tablet    Other Visit Diagnoses    Influenza vaccine administered       Relevant Orders   Flu Vaccine QUAD 6+ mos PF IM (Fluarix Quad PF) (Completed)      I am having Elin R. Thomason "[name]" start on tiZANidine. I am also having him maintain his triamcinolone ointment.  Meds ordered this encounter  Medications  . triamcinolone ointment (KENALOG) 0.1 %    Sig: Apply 1 application topically 2 (two) times daily as needed.    Dispense:  80 g    Refill:  1  . tiZANidine (ZANAFLEX) 2 MG tablet    Sig: Take 0.5-2 tablets (1-4 mg  total) by mouth at bedtime as needed for muscle spasms.    Dispense:  20 tablet    Refill:  1     Danise Edge, MD

## 2020-04-16 NOTE — Assessment & Plan Note (Signed)
Encouraged heart healthy diet, increase exercise, avoid trans fats, consider a krill oil cap daily 

## 2020-04-16 NOTE — Assessment & Plan Note (Signed)
He has lost some weight and his wife says he no longer snores. He was having trouble with his cpap so he sopped using it. His wife reports he is no longer snoring. They will report if that changes.

## 2020-04-16 NOTE — Assessment & Plan Note (Signed)
Encouraged moist heat and gentle stretching as tolerated. May try NSAIDs and prescription meds as directed and report if symptoms worsen or seek immediate care. Also given Tizanidine to take prn.

## 2020-04-16 NOTE — Assessment & Plan Note (Signed)
Given a refill on Triamcinolone to use prn on his neck

## 2020-04-21 LAB — COLOGUARD: Cologuard: NEGATIVE

## 2021-04-16 ENCOUNTER — Encounter: Payer: Self-pay | Admitting: Family Medicine

## 2021-04-16 ENCOUNTER — Ambulatory Visit (INDEPENDENT_AMBULATORY_CARE_PROVIDER_SITE_OTHER): Payer: No Typology Code available for payment source | Admitting: Family Medicine

## 2021-04-16 ENCOUNTER — Other Ambulatory Visit: Payer: Self-pay

## 2021-04-16 VITALS — BP 108/62 | HR 76 | Temp 97.5°F | Resp 16 | Ht 70.0 in | Wt 172.8 lb

## 2021-04-16 DIAGNOSIS — Z125 Encounter for screening for malignant neoplasm of prostate: Secondary | ICD-10-CM | POA: Diagnosis not present

## 2021-04-16 DIAGNOSIS — G4733 Obstructive sleep apnea (adult) (pediatric): Secondary | ICD-10-CM

## 2021-04-16 DIAGNOSIS — L309 Dermatitis, unspecified: Secondary | ICD-10-CM

## 2021-04-16 DIAGNOSIS — Z0000 Encounter for general adult medical examination without abnormal findings: Secondary | ICD-10-CM

## 2021-04-16 DIAGNOSIS — Z23 Encounter for immunization: Secondary | ICD-10-CM | POA: Diagnosis not present

## 2021-04-16 DIAGNOSIS — E782 Mixed hyperlipidemia: Secondary | ICD-10-CM | POA: Diagnosis not present

## 2021-04-16 DIAGNOSIS — Z1159 Encounter for screening for other viral diseases: Secondary | ICD-10-CM | POA: Diagnosis not present

## 2021-04-16 LAB — CBC WITH DIFFERENTIAL/PLATELET
Basophils Absolute: 0 10*3/uL (ref 0.0–0.1)
Basophils Relative: 0.4 % (ref 0.0–3.0)
Eosinophils Absolute: 0.2 10*3/uL (ref 0.0–0.7)
Eosinophils Relative: 2.5 % (ref 0.0–5.0)
HCT: 44 % (ref 39.0–52.0)
Hemoglobin: 14.6 g/dL (ref 13.0–17.0)
Lymphocytes Relative: 29.9 % (ref 12.0–46.0)
Lymphs Abs: 2 10*3/uL (ref 0.7–4.0)
MCHC: 33.3 g/dL (ref 30.0–36.0)
MCV: 84.4 fl (ref 78.0–100.0)
Monocytes Absolute: 0.5 10*3/uL (ref 0.1–1.0)
Monocytes Relative: 6.7 % (ref 3.0–12.0)
Neutro Abs: 4.1 10*3/uL (ref 1.4–7.7)
Neutrophils Relative %: 60.5 % (ref 43.0–77.0)
Platelets: 248 10*3/uL (ref 150.0–400.0)
RBC: 5.21 Mil/uL (ref 4.22–5.81)
RDW: 13.5 % (ref 11.5–15.5)
WBC: 6.8 10*3/uL (ref 4.0–10.5)

## 2021-04-16 LAB — LIPID PANEL
Cholesterol: 191 mg/dL (ref 0–200)
HDL: 42.7 mg/dL (ref >39.00)
LDL Cholesterol: 127 mg/dL — ABNORMAL HIGH (ref 0–99)
NonHDL: 148.27
Total CHOL/HDL Ratio: 4
Triglycerides: 105 mg/dL (ref 0.0–149.0)
VLDL: 21 mg/dL (ref 0.0–40.0)

## 2021-04-16 LAB — PSA: PSA: 0.5 ng/mL (ref 0.10–4.00)

## 2021-04-16 LAB — COMPREHENSIVE METABOLIC PANEL
ALT: 11 U/L (ref 0–53)
AST: 15 U/L (ref 0–37)
Albumin: 4.6 g/dL (ref 3.5–5.2)
Alkaline Phosphatase: 84 U/L (ref 39–117)
BUN: 15 mg/dL (ref 6–23)
CO2: 31 mEq/L (ref 19–32)
Calcium: 9.5 mg/dL (ref 8.4–10.5)
Chloride: 104 mEq/L (ref 96–112)
Creatinine, Ser: 0.95 mg/dL (ref 0.40–1.50)
GFR: 89.6 mL/min (ref >60.00)
Glucose, Bld: 82 mg/dL (ref 70–99)
Potassium: 4.9 mEq/L (ref 3.5–5.1)
Sodium: 141 mEq/L (ref 135–145)
Total Bilirubin: 1.7 mg/dL — ABNORMAL HIGH (ref 0.2–1.2)
Total Protein: 7.3 g/dL (ref 6.0–8.3)

## 2021-04-16 LAB — HEMOGLOBIN A1C: Hgb A1c MFr Bld: 5.4 % (ref 4.6–6.5)

## 2021-04-16 LAB — TSH: TSH: 3.98 u[IU]/mL (ref 0.35–5.50)

## 2021-04-16 MED ORDER — TRIAMCINOLONE ACETONIDE 0.1 % EX CREA: 1.0000 "application " | TOPICAL_CREAM | Freq: Two times a day (BID) | CUTANEOUS | 0 refills | Status: DC

## 2021-04-16 NOTE — Assessment & Plan Note (Addendum)
Patient encouraged to maintain heart healthy diet, regular exercise, adequate sleep. Consider daily probiotics. Take medications as prescribed. Labs ordered and reviewed. Negative Cologuard in 2021 repeat in 2024 or proceed with colonoscopy at that time. Given flu shot today, Shingrix shot given today

## 2021-04-16 NOTE — Patient Instructions (Addendum)
Shingrix is the new shingles shot, 2 shots over 2-6 months, confirm coverage with insurance and document, then can return here for shots with nurse appt or at pharmacy  Covid bivalent booster available downstairs at the [facility] pharmacy Mon-Fri, 9am-3pm with walk-ins or your preferred pharmacy. Take it 3-4 weeks before traveling  Hyponatremia: Salt deficiency   Preventive Care 22-56 Years Old, Male Preventive care refers to lifestyle choices and visits with your health care provider that can promote health and wellness. This includes: A yearly physical exam. This is also called an annual wellness visit. Regular dental and eye exams. Immunizations. Screening for certain conditions. Healthy lifestyle choices, such as: Eating a healthy diet. Getting regular exercise. Not using drugs or products that contain nicotine and tobacco. Limiting alcohol use. What can I expect for my preventive care visit? Physical exam Your health care provider will check your: Height and weight. These may be used to calculate your BMI (body mass index). BMI is a measurement that tells if you are at a healthy weight. Heart rate and blood pressure. Body temperature. Skin for abnormal spots. Counseling Your health care provider may ask you questions about your: Past medical problems. Family's medical history. Alcohol, tobacco, and drug use. Emotional well-being. Home life and relationship well-being. Sexual activity. Diet, exercise, and sleep habits. Work and work Astronomer. Access to firearms. What immunizations do I need? Vaccines are usually given at various ages, according to a schedule. Your health care provider will recommend vaccines for you based on your age, medical history, and lifestyle or other factors, such as travel or where you work. What tests do I need? Blood tests Lipid and cholesterol levels. These may be checked every 5 years, or more often if you are over 26 years old. Hepatitis C  test. Hepatitis B test. Screening Lung cancer screening. You may have this screening every year starting at age 85 if you have a 30-pack-year history of smoking and currently smoke or have quit within the past 15 years. Prostate cancer screening. Recommendations will vary depending on your family history and other risks. Genital exam to check for testicular cancer or hernias. Colorectal cancer screening. All adults should have this screening starting at age 46 and continuing until age 33. Your health care provider may recommend screening at age 21 if you are at increased risk. You will have tests every 1-10 years, depending on your results and the type of screening test. Diabetes screening. This is done by checking your blood sugar (glucose) after you have not eaten for a while (fasting). You may have this done every 1-3 years. STD (sexually transmitted disease) testing, if you are at risk. Follow these instructions at home: Eating and drinking  Eat a diet that includes fresh fruits and vegetables, whole grains, lean protein, and low-fat dairy products. Take vitamin and mineral supplements as recommended by your health care provider. Do not drink alcohol if your health care provider tells you not to drink. If you drink alcohol: Limit how much you have to 0-2 drinks a day. Be aware of how much alcohol is in your drink. In the [place] one drink equals one 12 oz bottle of beer (355 mL), one 5 oz glass of wine (148 mL), or one 1 oz glass of hard liquor (44 mL). Lifestyle Take daily care of your teeth and gums. Brush your teeth every morning and night with fluoride toothpaste. Floss one time each day. Stay active. Exercise for at least 30 minutes 5 or more days  each week. Do not use any products that contain nicotine or tobacco, such as cigarettes, e-cigarettes, and chewing tobacco. If you need help quitting, ask your health care provider. Do not use drugs. If you are sexually active, practice  safe sex. Use a condom or other form of protection to prevent STIs (sexually transmitted infections). If told by your health care provider, take low-dose aspirin daily starting at age 64. Find healthy ways to cope with stress, such as: Meditation, yoga, or listening to music. Journaling. Talking to a trusted person. Spending time with friends and family. Safety Always wear your seat belt while driving or riding in a vehicle. Do not drive: If you have been drinking alcohol. Do not ride with someone who has been drinking. When you are tired or distracted. While texting. Wear a helmet and other protective equipment during sports activities. If you have firearms in your house, make sure you follow all gun safety procedures. What's next? Go to your health care provider once a year for an annual wellness visit. Ask your health care provider how often you should have your eyes and teeth checked. Stay up to date on all vaccines. This information is not intended to replace advice given to you by your health care provider. Make sure you discuss any questions you have with your health care provider. Document Revised: 08/22/2020 Document Reviewed: 06/08/2018 Elsevier Patient Education  2022 [employer].

## 2021-04-16 NOTE — Assessment & Plan Note (Signed)
No longer using CPAP as he has lost 10 pounds due to

## 2021-04-16 NOTE — Progress Notes (Signed)
[name], male    DOB: 11-25-1965  Age: 55 y.o. MRN: [id number]    Subjective:   No chief complaint on file.  Subjective  HPI Michael Gibbs presents for office visit today for comprehensive physical exam today and follow up on management of chronic concerns. . Michael Gibbs had history of knee problems, but Michael Gibbs lost weight and started running so now it has been improving. Michael Gibbs was even able to get of his CPAP machine since his weight loss. Denies CP/palp/SOB/HA/congestion/fevers/GI or GU c/o. Taking meds as prescribed.  His father has a history of hyponatremia.  Behind his right ear has been bothering him with symptoms of itchiness and dryness. Michael Gibbs is using a topical ointment which Michael Gibbs states has helped.   Review of Systems  Constitutional:  Negative for chills, fatigue and fever.  HENT:  Negative for congestion, rhinorrhea, sinus pressure, sinus pain and sore throat.   Eyes:  Negative for pain.  Respiratory:  Negative for cough and shortness of breath.   Cardiovascular:  Negative for chest pain, palpitations and leg swelling.  Gastrointestinal:  Negative for abdominal pain, blood in stool, diarrhea, nausea and vomiting.  Genitourinary:  Negative for flank pain, frequency and penile pain.  Musculoskeletal:  Negative for back pain.  Neurological:  Negative for headaches.   History Past Medical History:  Diagnosis Date   Allergic state 04/25/2007   Seasonal allergies-well controlled with Allegra and Flonase     Dermatitis 09/22/2011   Headache(784.0)    Headache(784.0) 09/22/2011   Hyperlipidemia, mixed 08/11/2014   Low back pain during pregnancy in first trimester 09/06/2016   Preventative health care 04/02/2011   Sleep apnea    Sleep apnea     Michael Gibbs has no past surgical history on file.   His family history includes Cancer in his paternal grandmother; Depression in his brother; Diabetes in his mother; GER disease in his paternal grandmother; Other in his  father; Ulcers in his father.Michael Gibbs reports that Michael Gibbs has never smoked. Michael Gibbs has never used smokeless tobacco. Michael Gibbs reports current alcohol use. Michael Gibbs reports that Michael Gibbs does not use drugs.  Current Outpatient Medications on File Prior to Visit  Medication Sig Dispense Refill   tiZANidine (ZANAFLEX) 2 MG tablet Take 0.5-2 tablets (1-4 mg total) by mouth at bedtime as needed for muscle spasms. 20 tablet 1   No current facility-administered medications on file prior to visit.     Objective:  Objective  Physical Exam Constitutional:      General: Michael Gibbs is not in acute distress.    Appearance: Normal appearance. Michael Gibbs is not ill-appearing or toxic-appearing.  HENT:     Head: Normocephalic and atraumatic.     Right Ear: Tympanic membrane, ear canal and external ear normal.     Left Ear: Tympanic membrane, ear canal and external ear normal.     Nose: No congestion or rhinorrhea.     Mouth/Throat:     Mouth: Mucous membranes are moist. No injury.     Tongue: No lesions.     Tonsils: No tonsillar exudate or tonsillar abscesses.  Eyes:     Extraocular Movements: Extraocular movements intact.     Right eye: No nystagmus.     Left eye: No nystagmus.     Pupils: Pupils are equal, round, and reactive to light.  Cardiovascular:     Rate and Rhythm: Normal rate and regular rhythm.     Pulses: Normal pulses.     Heart sounds: Normal heart sounds.  No murmur heard. Pulmonary:     Effort: Pulmonary effort is normal. No respiratory distress.     Breath sounds: Normal breath sounds. No wheezing, rhonchi or rales.  Abdominal:     General: Bowel sounds are normal.     Palpations: Abdomen is soft. There is no mass.     Tenderness: There is no abdominal tenderness. There is no guarding.     Hernia: No hernia is present.  Musculoskeletal:        General: Normal range of motion.     Cervical back: Normal range of motion and neck supple.  Skin:    General: Skin is warm and dry.     Findings: Rash present. Rash is  scaling (behind right ear).  Neurological:     Mental Status: Michael Gibbs is alert and oriented to person, place, and time.     Cranial Nerves: No facial asymmetry.     Motor: Motor function is intact. No weakness.     Deep Tendon Reflexes:     Reflex Scores:      Patellar reflexes are 2+ on the right side and 2+ on the left side. Psychiatric:        Behavior: Behavior normal.   BP 108/62   Pulse 76   Temp (!) 97.5 F (36.4 C)   Resp 16   Ht 5\' 10"  (1.778 m)   Wt 172 lb 12.8 oz (78.4 kg)   SpO2 99%   BMI 24.79 kg/m  Wt Readings from Last 3 Encounters:  04/16/21 172 lb 12.8 oz (78.4 kg)  04/15/20 171 lb 9.6 oz (77.8 kg)  04/10/19 177 lb 12.8 oz (80.6 kg)     Lab Results  Component Value Date   WBC 6.8 04/16/2021   HGB 14.6 04/16/2021   HCT 44.0 04/16/2021   PLT 248.0 04/16/2021   GLUCOSE 82 04/16/2021   CHOL 191 04/16/2021   TRIG 105.0 04/16/2021   HDL 42.70 04/16/2021   LDLDIRECT 110 (H) 04/25/2007   LDLCALC 127 (H) 04/16/2021   ALT 11 04/16/2021   AST 15 04/16/2021   NA 141 04/16/2021   K 4.9 04/16/2021   CL 104 04/16/2021   CREATININE 0.95 04/16/2021   BUN 15 04/16/2021   CO2 31 04/16/2021   TSH 3.98 04/16/2021   PSA 0.50 04/16/2021   HGBA1C 5.4 04/16/2021    DG Lumbar Spine 2-3 Views  Result Date: 11/03/2016 CLINICAL DATA:  Lumbar spine pain for 2-3 weeks, worse over the past 3-4 days. EXAM: LUMBAR SPINE - 2-3 VIEW COMPARISON:  None. FINDINGS: No evidence of fracture, endplate erosion, or aggressive bone lesion. Spondylosis without focal or notable disc narrowing. Probable mild facet spurring at the lumbosacral junction. Probable aortic calcification. Mild lumbar levocurvature. No listhesis. IMPRESSION: 1. No acute finding. 2. Spondylosis without focal or advanced degenerative level. 3. Mild facet spurring at the lumbosacral junction. Electronically Signed   By: 01/03/2017 M.D.   On: 11/03/2016 11:02     Assessment & Plan:  Plan    Meds ordered this  encounter  Medications   triamcinolone cream (KENALOG) 0.1 %    Sig: Apply 1 application topically 2 (two) times daily.    Dispense:  30 g    Refill:  0     Problem List Items Addressed This Visit     Obstructive sleep apnea    No longer using CPAP as Michael Gibbs has lost 10 pounds due to       Preventative health care -  Primary    Patient encouraged to maintain heart healthy diet, regular exercise, adequate sleep. Consider daily probiotics. Take medications as prescribed. Labs ordered and reviewed. Negative Cologuard in 2021 repeat in 2024 or proceed with colonoscopy at that time. Given flu shot today, Shingrix shot given today      Relevant Orders   Hemoglobin A1c (Completed)   CBC with Differential/Platelet (Completed)   Comprehensive metabolic panel (Completed)   TSH (Completed)   PSA (Completed)   Lipid panel (Completed)   Dermatitis    Behind right ear. Is given Triamcinolone cream 0.1% to apply twice daily prn, Michael Gibbs will let us know if does not improve and we might consider trying a course of Ketoconazole shampoo to eliminate possibility of fungal component.      Hyperlipidemia, mixed    Encourage heart healthy diet such as MIND or DASH diet, increase exercise, avoid trans fats, simple carbohydrates and processed foods, consider a krill or fish or flaxseed oil cap daily.       Relevant Orders   Hemoglobin A1c (Completed)   CBC with Differential/Platelet (Completed)   Comprehensive metabolic panel (Completed)   TSH (Completed)   PSA (Completed)   Lipid panel (Completed)   Other Visit Diagnoses     Encounter for hepatitis C screening test for low risk patient       Relevant Orders   Hepatitis C antibody (Completed)   Need for shingles vaccine       Relevant Orders   Varicella-zoster vaccine IM (Completed)       Follow-up: Return in about 1 year (around 04/16/2022) for annual cpe, annual exam.  I, Billie Lade, acting as a scribe for Danise Edge, MD, have documented  all relevent documentation on behalf of Danise Edge, MD, as directed by Danise Edge, MD while in the presence of Danise Edge, MD. DO:04/17/21.  I, Bradd Canary, MD personally performed the services described in this documentation. All medical record entries made by the scribe were at my direction and in my presence. I have reviewed the chart and agree that the record reflects my personal performance and is accurate and complete

## 2021-04-17 LAB — HEPATITIS C ANTIBODY
Hepatitis C Ab: NONREACTIVE
SIGNAL TO CUT-OFF: 0.04 (ref <1.00)

## 2021-04-17 NOTE — Assessment & Plan Note (Signed)
Encourage heart healthy diet such as MIND or DASH diet, increase exercise, avoid trans fats, simple carbohydrates and processed foods, consider a krill or fish or flaxseed oil cap daily.  °

## 2021-04-17 NOTE — Assessment & Plan Note (Signed)
Behind right ear. Is given Triamcinolone cream 0.1% to apply twice daily prn, he will let us know if does not improve and we might consider trying a course of Ketoconazole shampoo to eliminate possibility of fungal component.

## 2021-04-19 ENCOUNTER — Other Ambulatory Visit: Payer: Self-pay | Admitting: Family Medicine

## 2021-04-20 ENCOUNTER — Telehealth: Payer: Self-pay

## 2021-04-20 NOTE — Telephone Encounter (Signed)
Pt called wanting to know reason for denial on refill for kenalog.  Please advise.

## 2021-04-20 NOTE — Telephone Encounter (Signed)
Called pharmacy and medication was ready

## 2021-05-19 ENCOUNTER — Encounter: Payer: Self-pay | Admitting: Family Medicine

## 2022-01-20 ENCOUNTER — Encounter (HOSPITAL_COMMUNITY)
Admission: EM | Disposition: A | Discharge status: Home / Self Care | Payer: Self-pay | Source: Home / Self Care | Attending: Cardiology

## 2022-01-20 ENCOUNTER — Other Ambulatory Visit: Payer: Self-pay

## 2022-01-20 ENCOUNTER — Inpatient Hospital Stay (HOSPITAL_COMMUNITY)
Admission: EM | Admit: 2022-01-20 | Discharge: 2022-01-23 | DRG: 246 | Disposition: A | Discharge status: Home / Self Care | Payer: No Typology Code available for payment source | Attending: Cardiology | Admitting: Cardiology

## 2022-01-20 ENCOUNTER — Emergency Department (HOSPITAL_COMMUNITY): Payer: No Typology Code available for payment source

## 2022-01-20 DIAGNOSIS — Z955 Presence of coronary angioplasty implant and graft: Secondary | ICD-10-CM

## 2022-01-20 DIAGNOSIS — I5021 Acute systolic (congestive) heart failure: Secondary | ICD-10-CM | POA: Diagnosis not present

## 2022-01-20 DIAGNOSIS — I25118 Atherosclerotic heart disease of native coronary artery with other forms of angina pectoris: Secondary | ICD-10-CM | POA: Diagnosis present

## 2022-01-20 DIAGNOSIS — E785 Hyperlipidemia, unspecified: Secondary | ICD-10-CM

## 2022-01-20 DIAGNOSIS — I255 Ischemic cardiomyopathy: Secondary | ICD-10-CM | POA: Diagnosis present

## 2022-01-20 DIAGNOSIS — E782 Mixed hyperlipidemia: Secondary | ICD-10-CM | POA: Diagnosis present

## 2022-01-20 DIAGNOSIS — Z818 Family history of other mental and behavioral disorders: Secondary | ICD-10-CM | POA: Diagnosis not present

## 2022-01-20 DIAGNOSIS — Z833 Family history of diabetes mellitus: Secondary | ICD-10-CM

## 2022-01-20 DIAGNOSIS — I472 Ventricular tachycardia, unspecified: Secondary | ICD-10-CM | POA: Diagnosis not present

## 2022-01-20 DIAGNOSIS — I5020 Unspecified systolic (congestive) heart failure: Secondary | ICD-10-CM

## 2022-01-20 DIAGNOSIS — I213 ST elevation (STEMI) myocardial infarction of unspecified site: Secondary | ICD-10-CM | POA: Diagnosis not present

## 2022-01-20 DIAGNOSIS — E7800 Pure hypercholesterolemia, unspecified: Secondary | ICD-10-CM | POA: Diagnosis not present

## 2022-01-20 DIAGNOSIS — I2102 ST elevation (STEMI) myocardial infarction involving left anterior descending coronary artery: Secondary | ICD-10-CM | POA: Diagnosis present

## 2022-01-20 DIAGNOSIS — Z20822 Contact with and (suspected) exposure to covid-19: Secondary | ICD-10-CM | POA: Diagnosis present

## 2022-01-20 DIAGNOSIS — R079 Chest pain, unspecified: Secondary | ICD-10-CM | POA: Diagnosis not present

## 2022-01-20 DIAGNOSIS — I959 Hypotension, unspecified: Secondary | ICD-10-CM | POA: Diagnosis not present

## 2022-01-20 DIAGNOSIS — I2510 Atherosclerotic heart disease of native coronary artery without angina pectoris: Secondary | ICD-10-CM | POA: Diagnosis not present

## 2022-01-20 HISTORY — DX: Atherosclerotic heart disease of native coronary artery without angina pectoris: I25.10

## 2022-01-20 HISTORY — PX: CORONARY/GRAFT ACUTE MI REVASCULARIZATION: CATH118305

## 2022-01-20 HISTORY — PX: LEFT HEART CATH AND CORONARY ANGIOGRAPHY: CATH118249

## 2022-01-20 HISTORY — DX: Unspecified systolic (congestive) heart failure: I50.20

## 2022-01-20 LAB — COMPREHENSIVE METABOLIC PANEL
ALT: 16 U/L (ref 0–44)
AST: 17 U/L (ref 15–41)
Albumin: 4.3 g/dL (ref 3.5–5.0)
Alkaline Phosphatase: 80 U/L (ref 38–126)
Anion gap: 8 (ref 5–15)
BUN: 9 mg/dL (ref 6–20)
CO2: 27 mmol/L (ref 22–32)
Calcium: 9.6 mg/dL (ref 8.9–10.3)
Chloride: 105 mmol/L (ref 98–111)
Creatinine, Ser: 0.95 mg/dL (ref 0.61–1.24)
GFR, Estimated: >60 mL/min (ref >60)
Glucose, Bld: 119 mg/dL — ABNORMAL HIGH (ref 70–99)
Potassium: 3.3 mmol/L — ABNORMAL LOW (ref 3.5–5.1)
Sodium: 140 mmol/L (ref 135–145)
Total Bilirubin: 1.8 mg/dL — ABNORMAL HIGH (ref 0.3–1.2)
Total Protein: 7.5 g/dL (ref 6.5–8.1)

## 2022-01-20 LAB — CBC WITH DIFFERENTIAL/PLATELET
Abs Immature Granulocytes: 0.03 10*3/uL (ref 0.00–0.07)
Basophils Absolute: 0.1 10*3/uL (ref 0.0–0.1)
Basophils Relative: 1 %
Eosinophils Absolute: 0.2 10*3/uL (ref 0.0–0.5)
Eosinophils Relative: 2 %
HCT: 47.4 % (ref 39.0–52.0)
Hemoglobin: 15.8 g/dL (ref 13.0–17.0)
Immature Granulocytes: 0 %
Lymphocytes Relative: 53 %
Lymphs Abs: 5.7 10*3/uL — ABNORMAL HIGH (ref 0.7–4.0)
MCH: 28.3 pg (ref 26.0–34.0)
MCHC: 33.3 g/dL (ref 30.0–36.0)
MCV: 84.9 fL (ref 80.0–100.0)
Monocytes Absolute: 0.7 10*3/uL (ref 0.1–1.0)
Monocytes Relative: 7 %
Neutro Abs: 3.8 10*3/uL (ref 1.7–7.7)
Neutrophils Relative %: 37 %
Platelets: 276 10*3/uL (ref 150–400)
RBC: 5.58 MIL/uL (ref 4.22–5.81)
RDW: 12.4 % (ref 11.5–15.5)
Smear Review: NORMAL
WBC: 10.5 10*3/uL (ref 4.0–10.5)
nRBC: 0 % (ref 0.0–0.2)

## 2022-01-20 LAB — TROPONIN I (HIGH SENSITIVITY): Troponin I (High Sensitivity): 65 ng/L — ABNORMAL HIGH (ref <18)

## 2022-01-20 LAB — LIPID PANEL
Cholesterol: 218 mg/dL — ABNORMAL HIGH (ref 0–200)
HDL: 46 mg/dL (ref >40)
LDL Cholesterol: 145 mg/dL — ABNORMAL HIGH (ref 0–99)
Total CHOL/HDL Ratio: 4.7 RATIO
Triglycerides: 134 mg/dL (ref <150)
VLDL: 27 mg/dL (ref 0–40)

## 2022-01-20 LAB — RESP PANEL BY RT-PCR (FLU A&B, COVID) ARPGX2
Influenza A by PCR: NEGATIVE
Influenza B by PCR: NEGATIVE
SARS Coronavirus 2 by RT PCR: NEGATIVE

## 2022-01-20 LAB — HEMOGLOBIN A1C
Hgb A1c MFr Bld: 5.2 % (ref 4.8–5.6)
Mean Plasma Glucose: 102.54 mg/dL

## 2022-01-20 LAB — PROTIME-INR
INR: 1.1 (ref 0.8–1.2)
Prothrombin Time: 13.8 seconds (ref 11.4–15.2)

## 2022-01-20 LAB — LIPASE, BLOOD: Lipase: 25 U/L (ref 11–51)

## 2022-01-20 LAB — APTT: aPTT: 28 seconds (ref 24–36)

## 2022-01-20 SURGERY — CORONARY/GRAFT ACUTE MI REVASCULARIZATION
Anesthesia: LOCAL

## 2022-01-20 MED ORDER — ACETAMINOPHEN 325 MG PO TABS
650.0000 mg | ORAL_TABLET | ORAL | Status: DC | PRN
  Administered 2022-01-21 – 2022-01-23 (×3): 650 mg via ORAL
  Filled 2022-01-20 (×4): qty 2

## 2022-01-20 MED ORDER — HEPARIN (PORCINE) IN NACL 1000-0.9 UT/500ML-% IV SOLN
INTRAVENOUS | Status: AC
  Filled 2022-01-20: qty 1000

## 2022-01-20 MED ORDER — HEPARIN SODIUM (PORCINE) 1000 UNIT/ML IJ SOLN
INTRAMUSCULAR | Status: AC
  Filled 2022-01-20: qty 10

## 2022-01-20 MED ORDER — NITROGLYCERIN 0.4 MG SL SUBL: 0.4000 mg | SUBLINGUAL_TABLET | SUBLINGUAL | Status: DC | PRN

## 2022-01-20 MED ORDER — NITROGLYCERIN 1 MG/10 ML FOR IR/CATH LAB
INTRA_ARTERIAL | Status: DC | PRN
  Administered 2022-01-20: 200 ug via INTRACORONARY

## 2022-01-20 MED ORDER — NITROGLYCERIN 1 MG/10 ML FOR IR/CATH LAB
INTRA_ARTERIAL | Status: AC
  Filled 2022-01-20: qty 10

## 2022-01-20 MED ORDER — ASPIRIN 81 MG PO CHEW
81.0000 mg | CHEWABLE_TABLET | Freq: Every day | ORAL | Status: DC
  Administered 2022-01-21 – 2022-01-23 (×3): 81 mg via ORAL
  Filled 2022-01-20 (×3): qty 1

## 2022-01-20 MED ORDER — VERAPAMIL HCL 2.5 MG/ML IV SOLN
INTRAVENOUS | Status: DC | PRN
  Administered 2022-01-20: 10 mL via INTRA_ARTERIAL

## 2022-01-20 MED ORDER — NITROGLYCERIN 0.4 MG SL SUBL
SUBLINGUAL_TABLET | SUBLINGUAL | Status: AC
  Administered 2022-01-20: 0.4 mg via SUBLINGUAL
  Filled 2022-01-20: qty 1

## 2022-01-20 MED ORDER — HEPARIN SODIUM (PORCINE) 1000 UNIT/ML IJ SOLN
INTRAMUSCULAR | Status: DC | PRN
  Administered 2022-01-20: 6000 [IU] via INTRAVENOUS

## 2022-01-20 MED ORDER — LIDOCAINE HCL (PF) 1 % IJ SOLN
INTRAMUSCULAR | Status: AC
  Filled 2022-01-20: qty 30

## 2022-01-20 MED ORDER — ATORVASTATIN CALCIUM 80 MG PO TABS
80.0000 mg | ORAL_TABLET | Freq: Every day | ORAL | Status: DC
Start: 2022-01-21 — End: 2022-01-23
  Administered 2022-01-21 – 2022-01-23 (×3): 80 mg via ORAL
  Filled 2022-01-20 (×3): qty 1

## 2022-01-20 MED ORDER — TICAGRELOR 90 MG PO TABS
ORAL_TABLET | ORAL | Status: AC
  Filled 2022-01-20: qty 2

## 2022-01-20 MED ORDER — NITROGLYCERIN 0.4 MG SL SUBL
0.4000 mg | SUBLINGUAL_TABLET | SUBLINGUAL | Status: DC | PRN
  Administered 2022-01-20: 0.4 mg via SUBLINGUAL
  Filled 2022-01-20: qty 1

## 2022-01-20 MED ORDER — TICAGRELOR 90 MG PO TABS
90.0000 mg | ORAL_TABLET | Freq: Two times a day (BID) | ORAL | Status: DC
  Administered 2022-01-21 – 2022-01-23 (×5): 90 mg via ORAL
  Filled 2022-01-20 (×5): qty 1

## 2022-01-20 MED ORDER — IOHEXOL 350 MG/ML SOLN
INTRAVENOUS | Status: DC | PRN
  Administered 2022-01-20: 80 mL via INTRA_ARTERIAL

## 2022-01-20 MED ORDER — ONDANSETRON HCL 4 MG/2ML IJ SOLN: 4.0000 mg | Freq: Four times a day (QID) | INTRAMUSCULAR | Status: DC | PRN

## 2022-01-20 MED ORDER — ASPIRIN 81 MG PO CHEW
324.0000 mg | CHEWABLE_TABLET | Freq: Once | ORAL | Status: AC
  Administered 2022-01-20: 324 mg via ORAL

## 2022-01-20 MED ORDER — HEPARIN (PORCINE) IN NACL 1000-0.9 UT/500ML-% IV SOLN
INTRAVENOUS | Status: DC | PRN
  Administered 2022-01-20 (×2): 500 mL

## 2022-01-20 MED ORDER — CARVEDILOL 3.125 MG PO TABS
3.1250 mg | ORAL_TABLET | Freq: Two times a day (BID) | ORAL | Status: DC
  Administered 2022-01-21 – 2022-01-23 (×5): 3.125 mg via ORAL
  Filled 2022-01-20 (×5): qty 1

## 2022-01-20 MED ORDER — LIDOCAINE HCL (PF) 1 % IJ SOLN
INTRAMUSCULAR | Status: DC | PRN
  Administered 2022-01-20: 2 mL

## 2022-01-20 MED ORDER — TICAGRELOR 90 MG PO TABS
ORAL_TABLET | ORAL | Status: DC | PRN
  Administered 2022-01-20: 180 mg via ORAL

## 2022-01-20 MED ORDER — VERAPAMIL HCL 2.5 MG/ML IV SOLN
INTRAVENOUS | Status: AC
  Filled 2022-01-20: qty 2

## 2022-01-20 MED ORDER — SODIUM CHLORIDE 0.9 % IV SOLN: INTRAVENOUS | Status: DC

## 2022-01-20 MED ORDER — HEPARIN SODIUM (PORCINE) 5000 UNIT/ML IJ SOLN
4000.0000 [IU] | Freq: Once | INTRAMUSCULAR | Status: AC
  Administered 2022-01-20: 4000 [IU] via INTRAVENOUS

## 2022-01-20 SURGICAL SUPPLY — 19 items
BALL SAPPHIRE NC24 3.75X15 (BALLOONS) ×2
BALLN SAPPHIRE 2.5X12 (BALLOONS) ×2
BALLOON SAPPHIRE 2.5X12 (BALLOONS) IMPLANT
BALLOON SAPPHIRE NC24 3.75X15 (BALLOONS) IMPLANT
BAND ZEPHYR COMPRESS 30 LONG (HEMOSTASIS) ×1 IMPLANT
CATH 5FR JL3.5 JR4 ANG PIG MP (CATHETERS) ×1 IMPLANT
CATH VISTA GUIDE 6FR XBLAD3.5 (CATHETERS) ×1 IMPLANT
ELECT DEFIB PAD ADLT CADENCE (PAD) ×1 IMPLANT
GLIDESHEATH SLEND SS 6F .021 (SHEATH) ×1 IMPLANT
GUIDEWIRE INQWIRE 1.5J.035X260 (WIRE) IMPLANT
INQWIRE 1.5J .035X260CM (WIRE) ×2
KIT ENCORE 26 ADVANTAGE (KITS) ×1 IMPLANT
KIT HEART LEFT (KITS) ×2 IMPLANT
PACK CARDIAC CATHETERIZATION (CUSTOM PROCEDURE TRAY) ×2 IMPLANT
STENT SYNERGY XD 3.50X24 (Permanent Stent) IMPLANT
SYNERGY XD 3.50X24 (Permanent Stent) ×2 IMPLANT
TRANSDUCER W/STOPCOCK (MISCELLANEOUS) ×2 IMPLANT
TUBING CIL FLEX 10 FLL-RA (TUBING) ×2 IMPLANT
WIRE ASAHI PROWATER 180CM (WIRE) ×1 IMPLANT

## 2022-01-20 NOTE — ED Provider Triage Note (Signed)
Emergency Medicine Provider Triage Evaluation Note  Michael Gibbs , a 56 y.o. male  was evaluated in triage.  Pt complains of central chest discomfort and pain that started around 9 PM while he was walking.  Generalized weakness in bilateral arms without radiation of pain.  No relief of pain with resting.  No history of the same.  No history of cardiac issues per patient and wife at the bedside.  Vomited x4 NBNB emesis on the way to the hospital.No recent travel or prolonged mobilization, no history of surgeries or clotting in the past.  Review of Systems  Positive: Chest pain, nausea, vomiting, lightheadedness, shortness of breath Negative: Palpitations, diarrhea, fever  Physical Exam  BP (!) 147/102 (BP Location: Left Arm)   Pulse 64   Resp 14   SpO2 100%  Gen:   Awake, visibly uncomfortable Resp:  Normal effort  MSK:   Moves extremities without difficulty  Other:  Irregularly irregular rhythm with normal rate at time of evaluation.  No murmurs gallops or rubs.  Lungs CTA B.  Neurovascular intact in extremities.  No diaphoresis.  Medical Decision Making  Medically screening exam initiated at 10:21 PM.  Appropriate orders placed.  Michael Gibbs was informed that the remainder of the evaluation will be completed by another provider, this initial triage assessment does not replace that evaluation, and the importance of remaining in the ED until their evaluation is complete.  Chest pain work-up initiated.    This chart was dictated using voice recognition software, Dragon. Despite the best efforts of this provider to proofread and correct errors, errors may still occur which can change documentation meaning.    Paris Lore, PA-C 01/20/22 2227

## 2022-01-20 NOTE — Progress Notes (Signed)
   01/20/22 2241  Clinical Encounter Type  Visited With Family;Health care provider (Patient's Wife: Denny Peon)  Visit Type ED;Code;Pre-op;Initial (STEMI)  Referral From Nurse Vicente Males A. Tempie Donning, RN)  Consult/Referral To Chaplain Albertina Parr Morene Crocker)  Recommendations CODE STEMI  Spiritual Encounters  Spiritual Needs Emotional   Paged to M.C.E.D. Trauma Room C for Code STEMI. Met wife of [name] at patient's bedside. Introduced [employer] available and offered hospitality. Located patient's son in E.D. Waiting Room and accompanied him to Trauma C in order to unite family.   [address] [place], [place]., [phone]

## 2022-01-20 NOTE — ED Provider Notes (Signed)
  [facility] EMERGENCY DEPARTMENT Provider Note   CSN: [id number] Arrival date & time: 01/20/22  2203     History {Add pertinent medical, surgical, social history, OB history to HPI:1} Chief Complaint  Patient presents with   Chest Pain   Code STEMI    DRESHON PROFFIT is a 56 y.o. male.  HPI     Home Medications Prior to Admission medications   Medication Sig Start Date End Date Taking? Authorizing Provider  tiZANidine (ZANAFLEX) 2 MG tablet Take 0.5-2 tablets (1-4 mg total) by mouth at bedtime as needed for muscle spasms. 04/15/20   Bradd Canary, MD  triamcinolone cream (KENALOG) 0.1 % Apply 1 application topically 2 (two) times daily. 04/16/21   Bradd Canary, MD      Allergies    Patient has no known allergies.    Review of Systems   Review of Systems  Physical Exam Updated Vital Signs BP (!) 144/97   Pulse 77   Resp 14   SpO2 91%  Physical Exam  ED Results / Procedures / Treatments   Labs (all labs ordered are listed, but only abnormal results are displayed) Labs Reviewed  RESP PANEL BY RT-PCR (FLU A&B, COVID) ARPGX2  CBC WITH DIFFERENTIAL/PLATELET  COMPREHENSIVE METABOLIC PANEL  LIPASE, BLOOD  HEMOGLOBIN A1C  PROTIME-INR  APTT  LIPID PANEL  TROPONIN I (HIGH SENSITIVITY)    EKG None  Radiology No results found.  Procedures Procedures  {Document cardiac monitor, telemetry assessment procedure when appropriate:1}  Medications Ordered in ED Medications  0.9 %  sodium chloride infusion ( Intravenous New Bag/Given 01/20/22 2255)  nitroGLYCERIN (NITROSTAT) SL tablet 0.4 mg (0.4 mg Sublingual Given 01/20/22 2247)  nitroGLYCERIN (NITROSTAT) 0.4 MG SL tablet (has no administration in time range)  aspirin chewable tablet 324 mg (324 mg Oral Given 01/20/22 2247)  heparin injection 4,000 Units (4,000 Units Intravenous Given 01/20/22 2246)    ED Course/ Medical Decision Making/ A&P                           Medical  Decision Making Risk Prescription drug management.   ***  {Document critical care time when appropriate:1} {Document review of labs and clinical decision tools ie heart score, Chads2Vasc2 etc:1}  {Document your independent review of radiology images, and any outside records:1} {Document your discussion with family members, caretakers, and with consultants:1} {Document social determinants of health affecting pt's care:1} {Document your decision making why or why not admission, treatments were needed:1} Final Clinical Impression(s) / ED Diagnoses Final diagnoses:  None    Rx / DC Orders ED Discharge Orders     None

## 2022-01-20 NOTE — H&P (Signed)
Cardiology Admission History and Physical:   Patient ID: LEVONTE MOLINA MRN: [id number]; DOB: [birth year]   Admission date: 01/20/2022  PCP:  Bradd Canary, MD   [facility] Providers Cardiologist:  None       Chief Complaint:  Chest pain, STEMI  Patient Profile:   Michael Gibbs is a 56 y.o. male with no significant PMH who is being seen 01/20/2022 for the evaluation of chest pain and STEMI.   History of Present Illness:   Michael Gibbs is a 56 yo M with HLD but no other significant PMH who presents with acute chest pain and EKG demonstrating anterior-lateral STEMI.  Briefly, patient states for the past two days has had progressive exertional chest pain, relieved with rest until 9 pm tonight when had sudden substernal chest pain, SOB, diaphersis that did not relieve with rest; therefore, came to the ED. EKG demonstrating anterior-lateral STEMI. CODE STEMI activated. Patient received heparin 4000U, ASA 325 mg, SL nitro x2 with persistent chest pain.  Emergent LHC demonstrating 100% pLAD occlusion s/p DES.   Past Medical History:  Diagnosis Date   Allergic state 04/25/2007   Seasonal allergies-well controlled with Allegra and Flonase     Dermatitis 09/22/2011   Headache(784.0)    Headache(784.0) 09/22/2011   Hyperlipidemia, mixed 08/11/2014   Low back pain during pregnancy in first trimester 09/06/2016   Preventative health care 04/02/2011   Sleep apnea    Sleep apnea    No past surgical history on file.   Medications Prior to Admission: Prior to Admission medications   Medication Sig Start Date End Date Taking? Authorizing Provider  tiZANidine (ZANAFLEX) 2 MG tablet Take 0.5-2 tablets (1-4 mg total) by mouth at bedtime as needed for muscle spasms. 04/15/20   Bradd Canary, MD  triamcinolone cream (KENALOG) 0.1 % Apply 1 application topically 2 (two) times daily. 04/16/21   Bradd Canary, MD     Allergies:   No Known Allergies  Social History:    Social History   Socioeconomic History   Marital status: Married    Spouse name: Not on file   Number of children: Not on file   Years of education: Not on file   Highest education level: Not on file  Occupational History   Not on file  Tobacco Use   Smoking status: Never   Smokeless tobacco: Never  Substance and Sexual Activity   Alcohol use: Yes    Comment: rare beer   Drug use: No   Sexual activity: Yes    Comment: lives with wife,  works in IT, vegetarian, wears a seat baelt  Other Topics Concern   Not on file  Social History Narrative   Patient is from [place] but lives in [place] 2002, has been in the [place] since 1996, lives with wife and 2 children-ages 9 and 7, wife works from home, her office is in [place]. Remainder family lives in [place]. Patient works in Consulting civil engineer. Enjoys tennis. Vegetarian.   Social Determinants of Health   Financial Resource Strain: Not on file  Food Insecurity: Not on file  Transportation Needs: Not on file  Physical Activity: Not on file  Stress: Not on file  Social Connections: Not on file  Intimate Partner Violence: Not on file    Family History:   The patient's family history includes Cancer in his paternal grandmother; Depression in his brother; Diabetes in his mother; GER disease in his paternal grandmother; Other in his father; Ulcers in his  father.    ROS:  Please see the history of present illness.  All other ROS reviewed and negative.     Physical Exam/Data:   Vitals:   01/20/22 2206 01/20/22 2245 01/20/22 2300 01/20/22 2322  BP: (!) 147/102 (!) 144/97    Pulse: 64 77    Resp: 14 14    Temp:   (!) 97 F (36.1 C)   TempSrc:   Temporal   SpO2: 100% 91% 98% 100%  Weight:   78.4 kg   Height:   5\' 10"  (1.778 m)    No intake or output data in the 24 hours ending 01/20/22 2334    01/20/2022   11:00 PM 04/16/2021    9:13 AM 04/15/2020    9:54 AM  Last 3 Weights  Weight (lbs) 172 lb 13.5 oz 172 lb 12.8 oz 171 lb 9.6 oz   Weight (kg) 78.4 kg 78.382 kg 77.837 kg     Body mass index is 24.8 kg/m.  General:  Well nourished, well developed, in mild distress due to chest pain HEENT: normal Neck: no JVD Vascular: No carotid bruits; Distal pulses 2+ bilaterally   Cardiac:  normal S1, S2; RRR; no murmur  Lungs:  clear to auscultation bilaterally, no wheezing, rhonchi or rales  Abd: soft, nontender, no hepatomegaly  Ext: no edema Musculoskeletal:  No deformities, BUE and BLE strength normal and equal Skin: warm and dry  Neuro:  CNs 2-12 intact, no focal abnormalities noted Psych:  Normal affect   EKG:  The ECG that was done was personally reviewed and demonstrates STE in V2-V5 with reciprocal STD in inferior leads. Frequent PVC.   Relevant CV Studies: No priors  Laboratory Data:  High Sensitivity Troponin:  No results for input(s): "TROPONINIHS" in the last 720 hours.    ChemistryNo results for input(s): "NA", "K", "CL", "CO2", "GLUCOSE", "BUN", "CREATININE", "CALCIUM", "MG", "GFRNONAA", "GFRAA", "ANIONGAP" in the last 168 hours.  No results for input(s): "PROT", "ALBUMIN", "AST", "ALT", "ALKPHOS", "BILITOT" in the last 168 hours. Lipids No results for input(s): "CHOL", "TRIG", "HDL", "LABVLDL", "LDLCALC", "CHOLHDL" in the last 168 hours. Hematology Recent Labs  Lab 01/20/22 2245  WBC 10.5  RBC 5.58  HGB 15.8  HCT 47.4  MCV 84.9  MCH 28.3  MCHC 33.3  RDW 12.4  PLT 276   Thyroid No results for input(s): "TSH", "FREET4" in the last 168 hours. BNPNo results for input(s): "BNP", "PROBNP" in the last 168 hours.  DDimer No results for input(s): "DDIMER" in the last 168 hours.  Radiology/Studies:  No results found.  Assessment and Plan:   Anterior STEMI Patient presenting with ACS found to have anterior STEMI with 100% pLAD occlusion. Underwent successful DES to pLAD. Patient admitted to CICU for further cardiac monitor, will require aggressive secondary prevention and GDMT for ACS.  - s/p ASA  load, heparin - ASA 81 mg in AM - loaded with ticagrelor, continue 90 mg BID daily  - start atorvastatin 80 mg - post cath EKG, trop for post-reperfusion baseline  - lipid,TSH, lipo A, BMP orderd - TTE in AM - start ACEi pending BMP to ensure Cr and K stable - metoprolol tartrate 12.5 mg BID   Risk Assessment/Risk Scores:    TIMI Risk Score for ST  Elevation MI:   The patient's TIMI risk score is 1, which indicates a 1.6% risk of all cause mortality at 30 days.    Severity of Illness: The appropriate patient status for this patient is INPATIENT.  Inpatient status is judged to be reasonable and necessary in order to provide the required intensity of service to ensure the patient's safety. The patient's presenting symptoms, physical exam findings, and initial radiographic and laboratory data in the context of their chronic comorbidities is felt to place them at high risk for further clinical deterioration. Furthermore, it is not anticipated that the patient will be medically stable for discharge from the hospital within 2 midnights of admission.   * I certify that at the point of admission it is my clinical judgment that the patient will require inpatient hospital care spanning beyond 2 midnights from the point of admission due to high intensity of service, high risk for further deterioration and high frequency of surveillance required.*   For questions or updates, please contact [facility] Please consult [email or web address] for contact info under     Signed, Thana Ates, MD  01/20/2022 11:34 PM

## 2022-01-20 NOTE — ED Notes (Signed)
[facility] called to activate Stemi per Dr. Para Skeans

## 2022-01-21 ENCOUNTER — Telehealth (HOSPITAL_COMMUNITY): Payer: Self-pay | Admitting: Pharmacy Technician

## 2022-01-21 ENCOUNTER — Inpatient Hospital Stay (HOSPITAL_COMMUNITY): Payer: No Typology Code available for payment source

## 2022-01-21 ENCOUNTER — Other Ambulatory Visit (HOSPITAL_COMMUNITY): Payer: Self-pay

## 2022-01-21 ENCOUNTER — Encounter (HOSPITAL_COMMUNITY): Payer: Self-pay | Admitting: Cardiology

## 2022-01-21 DIAGNOSIS — E7800 Pure hypercholesterolemia, unspecified: Secondary | ICD-10-CM | POA: Diagnosis not present

## 2022-01-21 DIAGNOSIS — I2102 ST elevation (STEMI) myocardial infarction involving left anterior descending coronary artery: Secondary | ICD-10-CM | POA: Diagnosis not present

## 2022-01-21 LAB — POCT I-STAT, CHEM 8
BUN: 10 mg/dL (ref 6–20)
Calcium, Ion: 1.1 mmol/L — ABNORMAL LOW (ref 1.15–1.40)
Chloride: 83 mmol/L — ABNORMAL LOW (ref 98–111)
Creatinine, Ser: 0.3 mg/dL — ABNORMAL LOW (ref 0.61–1.24)
Glucose, Bld: 89 mg/dL (ref 70–99)
HCT: 39 % (ref 39.0–52.0)
Hemoglobin: 13.3 g/dL (ref 13.0–17.0)
Potassium: 3.1 mmol/L — ABNORMAL LOW (ref 3.5–5.1)
Sodium: 119 mmol/L — CL (ref 135–145)
TCO2: 20 mmol/L — ABNORMAL LOW (ref 22–32)

## 2022-01-21 LAB — DIFFERENTIAL
Abs Immature Granulocytes: 0.03 10*3/uL (ref 0.00–0.07)
Basophils Absolute: 0 10*3/uL (ref 0.0–0.1)
Basophils Relative: 0 %
Eosinophils Absolute: 0 10*3/uL (ref 0.0–0.5)
Eosinophils Relative: 0 %
Immature Granulocytes: 0 %
Lymphocytes Relative: 15 %
Lymphs Abs: 1.5 10*3/uL (ref 0.7–4.0)
Monocytes Absolute: 0.5 10*3/uL (ref 0.1–1.0)
Monocytes Relative: 5 %
Neutro Abs: 7.7 10*3/uL (ref 1.7–7.7)
Neutrophils Relative %: 80 %

## 2022-01-21 LAB — BASIC METABOLIC PANEL
Anion gap: 8 (ref 5–15)
BUN: 10 mg/dL (ref 6–20)
CO2: 24 mmol/L (ref 22–32)
Calcium: 9 mg/dL (ref 8.9–10.3)
Chloride: 106 mmol/L (ref 98–111)
Creatinine, Ser: 0.79 mg/dL (ref 0.61–1.24)
GFR, Estimated: >60 mL/min (ref >60)
Glucose, Bld: 118 mg/dL — ABNORMAL HIGH (ref 70–99)
Potassium: 3.7 mmol/L (ref 3.5–5.1)
Sodium: 138 mmol/L (ref 135–145)

## 2022-01-21 LAB — CBC
HCT: 39.6 % (ref 39.0–52.0)
Hemoglobin: 13.7 g/dL (ref 13.0–17.0)
MCH: 28.8 pg (ref 26.0–34.0)
MCHC: 34.6 g/dL (ref 30.0–36.0)
MCV: 83.2 fL (ref 80.0–100.0)
Platelets: 235 10*3/uL (ref 150–400)
RBC: 4.76 MIL/uL (ref 4.22–5.81)
RDW: 12.4 % (ref 11.5–15.5)
WBC: 9.8 10*3/uL (ref 4.0–10.5)
nRBC: 0 % (ref 0.0–0.2)

## 2022-01-21 LAB — POCT ACTIVATED CLOTTING TIME
Activated Clotting Time: 228 seconds
Activated Clotting Time: 498 seconds

## 2022-01-21 LAB — ECHOCARDIOGRAM COMPLETE
AR max vel: 1.96 cm2
AV Area VTI: 1.99 cm2
AV Area mean vel: 1.9 cm2
AV Mean grad: 3 mmHg
AV Peak grad: 4.9 mmHg
Ao pk vel: 1.11 m/s
Area-P 1/2: 2.64 cm2
Height: 70 in
S' Lateral: 2.8 cm
Weight: 2765.45 oz

## 2022-01-21 LAB — MRSA NEXT GEN BY PCR, NASAL: MRSA by PCR Next Gen: NOT DETECTED

## 2022-01-21 LAB — MAGNESIUM: Magnesium: 1.8 mg/dL (ref 1.7–2.4)

## 2022-01-21 LAB — TROPONIN I (HIGH SENSITIVITY): Troponin I (High Sensitivity): >24000 ng/L (ref <18)

## 2022-01-21 LAB — HIV ANTIBODY (ROUTINE TESTING W REFLEX): HIV Screen 4th Generation wRfx: NONREACTIVE

## 2022-01-21 LAB — TSH: TSH: 1.993 u[IU]/mL (ref 0.350–4.500)

## 2022-01-21 MED ORDER — MAGNESIUM SULFATE 2 GM/50ML IV SOLN
2.0000 g | Freq: Once | INTRAVENOUS | Status: AC
  Administered 2022-01-21: 2 g via INTRAVENOUS
  Filled 2022-01-21: qty 50

## 2022-01-21 MED ORDER — ONDANSETRON HCL 4 MG/2ML IJ SOLN: 4.0000 mg | Freq: Four times a day (QID) | INTRAMUSCULAR | Status: DC | PRN

## 2022-01-21 MED ORDER — SODIUM CHLORIDE 0.9% FLUSH
3.0000 mL | Freq: Two times a day (BID) | INTRAVENOUS | Status: DC
  Administered 2022-01-21 – 2022-01-23 (×6): 3 mL via INTRAVENOUS

## 2022-01-21 MED ORDER — ACETAMINOPHEN 325 MG PO TABS: 650.0000 mg | ORAL_TABLET | ORAL | Status: DC | PRN

## 2022-01-21 MED ORDER — ORAL CARE MOUTH RINSE: 15.0000 mL | OROMUCOSAL | Status: DC | PRN

## 2022-01-21 MED ORDER — ATORVASTATIN CALCIUM 80 MG PO TABS: 80.0000 mg | ORAL_TABLET | Freq: Every day | ORAL | Status: DC

## 2022-01-21 MED ORDER — ENOXAPARIN SODIUM 40 MG/0.4ML IJ SOSY
40.0000 mg | PREFILLED_SYRINGE | INTRAMUSCULAR | Status: DC
  Administered 2022-01-21 – 2022-01-23 (×3): 40 mg via SUBCUTANEOUS
  Filled 2022-01-21 (×3): qty 0.4

## 2022-01-21 MED ORDER — LISINOPRIL 5 MG PO TABS: 5.0000 mg | ORAL_TABLET | Freq: Every day | ORAL | Status: DC

## 2022-01-21 MED ORDER — POTASSIUM CHLORIDE CRYS ER 20 MEQ PO TBCR
40.0000 meq | EXTENDED_RELEASE_TABLET | Freq: Once | ORAL | Status: AC
Start: 2022-01-21 — End: 2022-01-21
  Administered 2022-01-21: 40 meq via ORAL
  Filled 2022-01-21: qty 2

## 2022-01-21 MED ORDER — SODIUM CHLORIDE 0.9 % IV SOLN: 250.0000 mL | INTRAVENOUS | Status: DC | PRN

## 2022-01-21 MED ORDER — CHLORHEXIDINE GLUCONATE CLOTH 2 % EX PADS
6.0000 | MEDICATED_PAD | Freq: Every day | CUTANEOUS | Status: DC
  Administered 2022-01-21 – 2022-01-22 (×2): 6 via TOPICAL

## 2022-01-21 MED ORDER — PERFLUTREN LIPID MICROSPHERE
1.0000 mL | INTRAVENOUS | Status: AC | PRN
  Administered 2022-01-21: 2 mL via INTRAVENOUS

## 2022-01-21 MED ORDER — SODIUM CHLORIDE 0.9% FLUSH
3.0000 mL | INTRAVENOUS | Status: DC | PRN
Start: 2022-01-21 — End: 2022-01-23

## 2022-01-21 NOTE — TOC Benefit Eligibility Note (Addendum)
Patient Product/process development scientist completed.    The patient is currently admitted and upon discharge could be taking Brilinta 90 mg.  The current 30 day co-pay is $80.00.   The patient is currently admitted and upon discharge could be taking Farxiga 10 mg.  The current 30 day co-pay is $80.00.   The patient is currently admitted and upon discharge could be taking Jardiance 10 mg.  The current 30 day co-pay is $80.00.   The patient is insured through [employer]     Roland Earl, CPhT Pharmacy Patient Advocate Specialist [facility] Patient Advocate Team Direct Number: [phone]  Fax: [phone]

## 2022-01-21 NOTE — Progress Notes (Signed)
Bleeding at right radial site under TR band. Notified cath lab. MD aware. Staff from cath lab cleaned site and reapplied the TR band with 8 cc air in band at 0745. TR band completely deflated by 1100. Patient instructed to elevate right arm at or above heart level while at rest until 1100 tomorrow.

## 2022-01-21 NOTE — Progress Notes (Signed)
Progress Note  Patient Name: Michael Gibbs Date of Encounter: 01/21/2022  [facility] Cardiologist: None New- Dr Brittanny Levenhagen [place]  Subjective   Still has a dull ache in chest. No dyspnea or palpitations.  Inpatient Medications    Scheduled Meds:  aspirin  81 mg Oral Daily   atorvastatin  80 mg Oral Daily   carvedilol  3.125 mg Oral BID WC   enoxaparin (LOVENOX) injection  40 mg Subcutaneous Q24H   sodium chloride flush  3 mL Intravenous Q12H   ticagrelor  90 mg Oral BID   Continuous Infusions:  sodium chloride 20 mL/hr at 01/21/22 0500   sodium chloride     PRN Meds: sodium chloride, acetaminophen, nitroGLYCERIN, ondansetron (ZOFRAN) IV, mouth rinse, sodium chloride flush   Vital Signs    Vitals:   01/21/22 0500 01/21/22 0515 01/21/22 0530 01/21/22 0600  BP: 97/72   98/76  Pulse: 72 75 75 72  Resp:   15 16  Temp:      TempSrc:      SpO2: 98% 97% 96% 97%  Weight:      Height:        Intake/Output Summary (Last 24 hours) at 01/21/2022 0720 Last data filed at 01/21/2022 0500 Gross per 24 hour  Intake 121.67 ml  Output --  Net 121.67 ml      01/20/2022   11:00 PM 04/16/2021    9:13 AM 04/15/2020    9:54 AM  Last 3 Weights  Weight (lbs) 172 lb 13.5 oz 172 lb 12.8 oz 171 lb 9.6 oz  Weight (kg) 78.4 kg 78.382 kg 77.837 kg      Telemetry    NSR with PVCs and several runs of NSVT - Personally Reviewed  ECG    NSR, Q waves V1-4 with some persistent ST elevation - Personally Reviewed  Physical Exam   GEN: No acute distress.   Neck: No JVD Cardiac: RRR, no murmurs, rubs, or gallops.  Respiratory: Clear to auscultation bilaterally. GI: Soft, nontender, non-distended  MS: No edema; No deformity. TR band still in place Neuro:  Nonfocal  Psych: Normal affect   Labs    High Sensitivity Troponin:   Recent Labs  Lab 01/20/22 2245  TROPONINIHS 65*     Chemistry Recent Labs  Lab 01/20/22 2245 01/20/22 2339  NA 140 119*  K 3.3* 3.1*  CL  105 83*  CO2 27  --   GLUCOSE 119* 89  BUN 9 10  CREATININE 0.95 0.30*  CALCIUM 9.6  --   PROT 7.5  --   ALBUMIN 4.3  --   AST 17  --   ALT 16  --   ALKPHOS 80  --   BILITOT 1.8*  --   GFRNONAA >60  --   ANIONGAP 8  --     Lipids  Recent Labs  Lab 01/20/22 2245  CHOL 218*  TRIG 134  HDL 46  LDLCALC 145*  CHOLHDL 4.7    Hematology Recent Labs  Lab 01/20/22 2245 01/20/22 2339 01/21/22 0634  WBC 10.5  --  9.8  RBC 5.58  --  4.76  HGB 15.8 13.3 13.7  HCT 47.4 39.0 39.6  MCV 84.9  --  83.2  MCH 28.3  --  28.8  MCHC 33.3  --  34.6  RDW 12.4  --  12.4  PLT 276  --  235   Thyroid No results for input(s): "TSH", "FREET4" in the last 168 hours.  BNPNo results for  input(s): "BNP", "PROBNP" in the last 168 hours.  DDimer No results for input(s): "DDIMER" in the last 168 hours.   Radiology    DG Chest Port 1 View  Result Date: 01/21/2022 CLINICAL DATA:  [id number].  Inpatient chest film. EXAM: PORTABLE CHEST 1 VIEW COMPARISON:  PA Lat 10/24/2008 FINDINGS: The heart size and mediastinal contours are within normal limits. There is new demonstration of a stent in the LAD coronary artery. Both lungs are clear. The visualized skeletal structures are intact, with moderate thoracic spondylosis. IMPRESSION: No evidence of acute chest disease. Electronically Signed   By: Almira Bar M.D.   On: 01/21/2022 06:13   CARDIAC CATHETERIZATION  Result Date: 01/21/2022   Prox LAD lesion is 100% stenosed.   Mid Cx to Dist Cx lesion is 20% stenosed with 20% stenosed side branch in LPAV.   A drug-eluting stent was successfully placed using a SYNERGY XD 3.50X24.   Post intervention, there is a 0% residual stenosis.   LV end diastolic pressure is moderately elevated. Single vessel occlusive CAD involving the proximal LAD Moderately elevated LVEDP 30 mm Hg Successful PCI of the proximal LAD with DES x 1. Plan: DAPT for one year.    Cardiac Studies   Coronary/Graft Acute MI Revascularization  LEFT  HEART CATH AND CORONARY ANGIOGRAPHY   Conclusion      Prox LAD lesion is 100% stenosed.   Mid Cx to Dist Cx lesion is 20% stenosed with 20% stenosed side branch in LPAV.   A drug-eluting stent was successfully placed using a SYNERGY XD 3.50X24.   Post intervention, there is a 0% residual stenosis.   LV end diastolic pressure is moderately elevated.   Single vessel occlusive CAD involving the proximal LAD Moderately elevated LVEDP 30 mm Hg Successful PCI of the proximal LAD with DES x 1.   Plan: DAPT for one year Diagnostic Dominance: Left  Intervention   Patient Profile     56 y.o. male presented with acute anterior STEMI  Assessment & Plan     1. Acute anterior STEMI. Patient had 2 days of exertional angina then developed severe pain at rest last night. Had emergent cardiac cath with occluded proximal LAD. Successful reperfusion with DES. No other significant disease. Troponin initially 65. Repeat pending. EDP elevated 30 mm Hg. Does not appear volume overloaded today. Repeat Ecg with some persistent ST elevation and Q waves suggestive of significant infarct. Echo is pending. BP soft. Currently on DAPT with ASA and Brilinta. High dose statin. On low dose Coreg. BP is soft. Will need ARB vs Entresto once BP better. Having some NSVT on monitor post reperfusion- asymptomatic. Replete potassium and magnesium as needed. 2. Hypercholesterolemia. LDL 145. On high dose statin.   For questions or updates, please contact [facility] Please consult [email or web address] for contact info under        Signed, Jakyia Gaccione [place], MD  01/21/2022, 7:20 AM

## 2022-01-21 NOTE — Progress Notes (Signed)
   01/20/22 2340  Clinical Encounter Type  Visited With Patient and family together;Health care provider  Visit Type ED;Critical Care;Pre-op;Follow-up  Referral From Nurse  Consult/Referral To Chaplain Benetta Spar)  Spiritual Encounters  Spiritual Needs Emotional   Chaplain Accompanied patient and family to Cath-Lab.  Accompanied patient's wife and son to [facility] Family Waiting and provided hospitality. [address] [place], [place]., [phone]

## 2022-01-21 NOTE — Progress Notes (Signed)
   01/21/22 0040  Clinical Encounter Type  Visited With Patient;Family;Health care provider  Visit Type Follow-up;Post-op  Consult/Referral To Chaplain Gelene Mink [employer])  Spiritual Encounters  Spiritual Needs Emotional   Post-STEMI located patient's family and accompanied patient's wife and son to patient's room - uniting family. [address] [place], [place]., [phone]

## 2022-01-21 NOTE — Progress Notes (Addendum)
Seen pt from [phone] for education+ ambulation, pt had some bleeding in his wrist so TR band is being adjusted. Will follow up later.   Michael Gibbs 01/21/2022 9:52 AM

## 2022-01-21 NOTE — Telephone Encounter (Addendum)
Pharmacy Patient Advocate Encounter  Insurance verification completed.    The patient is insured through [employer]   The patient is currently admitted and ran test claims for the following: Brilinta, Farxiga, Jardiance  Copays and coinsurance results were relayed to Inpatient clinical team.

## 2022-01-22 ENCOUNTER — Other Ambulatory Visit (HOSPITAL_COMMUNITY): Payer: Self-pay

## 2022-01-22 ENCOUNTER — Encounter (HOSPITAL_COMMUNITY): Payer: Self-pay | Admitting: Student

## 2022-01-22 DIAGNOSIS — I2102 ST elevation (STEMI) myocardial infarction involving left anterior descending coronary artery: Secondary | ICD-10-CM | POA: Diagnosis not present

## 2022-01-22 DIAGNOSIS — E7800 Pure hypercholesterolemia, unspecified: Secondary | ICD-10-CM | POA: Diagnosis not present

## 2022-01-22 LAB — BASIC METABOLIC PANEL
Anion gap: 5 (ref 5–15)
BUN: 8 mg/dL (ref 6–20)
CO2: 25 mmol/L (ref 22–32)
Calcium: 8.6 mg/dL — ABNORMAL LOW (ref 8.9–10.3)
Chloride: 107 mmol/L (ref 98–111)
Creatinine, Ser: 0.83 mg/dL (ref 0.61–1.24)
GFR, Estimated: >60 mL/min (ref >60)
Glucose, Bld: 114 mg/dL — ABNORMAL HIGH (ref 70–99)
Potassium: 3.7 mmol/L (ref 3.5–5.1)
Sodium: 137 mmol/L (ref 135–145)

## 2022-01-22 LAB — BRAIN NATRIURETIC PEPTIDE: B Natriuretic Peptide: 225.6 pg/mL — ABNORMAL HIGH (ref 0.0–100.0)

## 2022-01-22 LAB — LIPOPROTEIN A (LPA): Lipoprotein (a): 153.8 nmol/L — ABNORMAL HIGH (ref <75.0)

## 2022-01-22 LAB — MAGNESIUM: Magnesium: 2 mg/dL (ref 1.7–2.4)

## 2022-01-22 MED ORDER — DAPAGLIFLOZIN PROPANEDIOL 10 MG PO TABS
10.0000 mg | ORAL_TABLET | Freq: Every day | ORAL | Status: DC
  Administered 2022-01-22 – 2022-01-23 (×2): 10 mg via ORAL
  Filled 2022-01-22 (×2): qty 1

## 2022-01-22 MED ORDER — POTASSIUM CHLORIDE CRYS ER 20 MEQ PO TBCR
40.0000 meq | EXTENDED_RELEASE_TABLET | Freq: Once | ORAL | Status: AC
Start: 2022-01-22 — End: 2022-01-22
  Administered 2022-01-22: 40 meq via ORAL
  Filled 2022-01-22: qty 2

## 2022-01-22 MED ORDER — TICAGRELOR 90 MG PO TABS
90.0000 mg | ORAL_TABLET | Freq: Two times a day (BID) | ORAL | 11 refills | Status: DC
  Filled 2022-01-22: qty 60, 30d supply, fill #0

## 2022-01-22 NOTE — Progress Notes (Signed)
  Transition of Care [facility]) Screening Note   Patient Details  Name: Michael Gibbs Date of Birth: 07/13/1965   [facility]) CM/SW Contact:    Delilah Shan, LCSWA Phone Number: 01/22/2022, 1:36 PM    Transition of Care Department [facility]) has reviewed patient and no TOC needs have been identified at this time. We will continue to monitor patient advancement through interdisciplinary progression rounds. If new patient transition needs arise, please place a TOC consult.

## 2022-01-22 NOTE — Progress Notes (Addendum)
RN reported that pt ambulated 2 laps this am and has showered. Pt reports he was not SOB but does feel generally tired. He is typically a runner. Began education with pt and wife. Discussed MI but visitors arrived. Will f/u. [phone] Ethelda Chick CES, ACSM 01/22/2022 1:50 PM

## 2022-01-22 NOTE — Progress Notes (Signed)
CARDIAC REHAB PHASE I   PRE:  Rate/Rhythm: 74 SR    BP: lying 89/73    SaO2: 96 RA  MODE:  Ambulation: 740 ft   POST:  Rate/Rhythm: 96 SR    BP: sitting 94/79     SaO2:   Slight dizziness upon standing. No c/o ambulating however does admit to weakness/fatigue after walking 2 laps. BP slightly increased from lying in bed.    Discussed with pt and wife MI, restrictions, stent, Brilinta importance, diet, exercise, NTG and CRPII. Also discussed taking BP at home and daily wts. Very receptive. Will refer to [facility] CRPII.  [phone]  Ethelda Chick CES, ACSM 01/22/2022 2:51 PM

## 2022-01-22 NOTE — Progress Notes (Signed)
Progress Note  Patient Name: Michael Gibbs Date of Encounter: 01/22/2022  [facility] Cardiologist: None New- Dr Charissa Knowles [place]  Subjective   Still has a dull ache in chest. No dyspnea or palpitations.  Inpatient Medications    Scheduled Meds:  aspirin  81 mg Oral Daily   atorvastatin  80 mg Oral Daily   carvedilol  3.125 mg Oral BID WC   Chlorhexidine Gluconate Cloth  6 each Topical Daily   enoxaparin (LOVENOX) injection  40 mg Subcutaneous Q24H   sodium chloride flush  3 mL Intravenous Q12H   ticagrelor  90 mg Oral BID   Continuous Infusions:  sodium chloride Stopped (01/22/22 0600)   sodium chloride     PRN Meds: sodium chloride, acetaminophen, nitroGLYCERIN, ondansetron (ZOFRAN) IV, mouth rinse, sodium chloride flush   Vital Signs    Vitals:   01/22/22 0700 01/22/22 0718 01/22/22 0730 01/22/22 0737  BP: (!) 85/70 91/64    Pulse: 85 77 75   Resp: 12  11 15   Temp:      TempSrc:      SpO2: 96%  97% 97%  Weight:      Height:        Intake/Output Summary (Last 24 hours) at 01/22/2022 0742 Last data filed at 01/21/2022 1600 Gross per 24 hour  Intake 589.97 ml  Output 675 ml  Net -85.03 ml       01/20/2022   11:00 PM 04/16/2021    9:13 AM 04/15/2020    9:54 AM  Last 3 Weights  Weight (lbs) 172 lb 13.5 oz 172 lb 12.8 oz 171 lb 9.6 oz  Weight (kg) 78.4 kg 78.382 kg 77.837 kg      Telemetry    NSR with PVCs and several runs of NSVT - Personally Reviewed  ECG    NSR, Q waves V1-4 with some persistent ST elevation - Personally Reviewed  Physical Exam   GEN: No acute distress.   Neck: No JVD Cardiac: RRR, no murmurs, rubs, or gallops.  Respiratory: Clear to auscultation bilaterally. GI: Soft, nontender, non-distended  MS: No edema; No deformity. TR band still in place Neuro:  Nonfocal  Psych: Normal affect   Labs    High Sensitivity Troponin:   Recent Labs  Lab 01/20/22 2245 01/21/22 0634  TROPONINIHS 65* >24,000*       Chemistry Recent Labs  Lab 01/20/22 2245 01/20/22 2339 01/21/22 0634 01/22/22 0052  NA 140 119* 138 137  K 3.3* 3.1* 3.7 3.7  CL 105 83* 106 107  CO2 27  --  24 25  GLUCOSE 119* 89 118* 114*  BUN 9 10 10 8   CREATININE 0.95 0.30* 0.79 0.83  CALCIUM 9.6  --  9.0 8.6*  MG  --   --  1.8 2.0  PROT 7.5  --   --   --   ALBUMIN 4.3  --   --   --   AST 17  --   --   --   ALT 16  --   --   --   ALKPHOS 80  --   --   --   BILITOT 1.8*  --   --   --   GFRNONAA >60  --  >60 >60  ANIONGAP 8  --  8 5     Lipids  Recent Labs  Lab 01/20/22 2245  CHOL 218*  TRIG 134  HDL 46  LDLCALC 145*  CHOLHDL 4.7     Hematology  Recent Labs  Lab 01/20/22 2245 01/20/22 2339 01/21/22 0634  WBC 10.5  --  9.8  RBC 5.58  --  4.76  HGB 15.8 13.3 13.7  HCT 47.4 39.0 39.6  MCV 84.9  --  83.2  MCH 28.3  --  28.8  MCHC 33.3  --  34.6  RDW 12.4  --  12.4  PLT 276  --  235    Thyroid  Recent Labs  Lab 01/21/22 0634  TSH 1.993    BNP Recent Labs  Lab 01/22/22 0052  BNP 225.6*    DDimer No results for input(s): "DDIMER" in the last 168 hours.   Radiology    ECHOCARDIOGRAM COMPLETE  Result Date: 01/21/2022    ECHOCARDIOGRAM REPORT   Patient Name:   Michael Gibbs Date of Exam: 01/21/2022 Medical Rec #:  [name]               Height:       70.0 in Accession #:    [id number]              Weight:       172.8 lb Date of Birth:  March 30, 1966               BSA:          1.962 m Patient Age:    56 years                BP:           125/91 mmHg Patient Gender: M                       HR:           71 bpm. Exam Location:  Inpatient Procedure: 2D Echo, Cardiac Doppler, Color Doppler and Intracardiac            Opacification Agent Indications:    Acute myocardial infarction  History:        Patient has no prior history of Echocardiogram examinations.                 Acute MI; Risk Factors:Dyslipidemia. S/P PCI.  Sonographer:    Clayton Lefort RDCS (AE) Referring Phys: [id number] Amarri Satterly M [place]   Sonographer Comments: Suboptimal apical window. IMPRESSIONS  1. Left ventricular ejection fraction, by estimation, is 30 to 35%. The left ventricle has moderately decreased function. The left ventricle demonstrates regional wall motion abnormalities with mid to apical anterior, anteroseptal, and inferoseptal akinesis. There is akinesis of the true apex, the apical inferior wall, and the apical lateral wall. This is suggestive of LAD territory MI. There is mild left ventricular hypertrophy. Left ventricular diastolic parameters are indeterminate.  2. Right ventricular systolic function is normal. The right ventricular size is normal. Tricuspid regurgitation signal is inadequate for assessing PA pressure.  3. The mitral valve is normal in structure. Mild mitral valve regurgitation. No evidence of mitral stenosis.  4. The aortic valve is tricuspid. There is mild calcification of the aortic valve. Aortic valve regurgitation is not visualized. No aortic stenosis is present.  5. The inferior vena cava is normal in size with greater than 50% respiratory variability, suggesting right atrial pressure of 3 mmHg. FINDINGS  Left Ventricle: Left ventricular ejection fraction, by estimation, is 30 to 35%. The left ventricle has moderately decreased function. The left ventricle demonstrates regional wall motion abnormalities. Definity contrast agent was given IV to delineate the left ventricular endocardial borders. The left  ventricular internal cavity size was normal in size. There is mild left ventricular hypertrophy. Left ventricular diastolic parameters are indeterminate. Right Ventricle: The right ventricular size is normal. No increase in right ventricular wall thickness. Right ventricular systolic function is normal. Tricuspid regurgitation signal is inadequate for assessing PA pressure. Left Atrium: Left atrial size was normal in size. Right Atrium: Right atrial size was normal in size. Pericardium: Trivial pericardial  effusion is present. Mitral Valve: The mitral valve is normal in structure. Mild mitral annular calcification. Mild mitral valve regurgitation. No evidence of mitral valve stenosis. Tricuspid Valve: The tricuspid valve is normal in structure. Tricuspid valve regurgitation is trivial. Aortic Valve: The aortic valve is tricuspid. There is mild calcification of the aortic valve. Aortic valve regurgitation is not visualized. No aortic stenosis is present. Aortic valve mean gradient measures 3.0 mmHg. Aortic valve peak gradient measures 4.9 mmHg. Aortic valve area, by VTI measures 1.99 cm. Pulmonic Valve: The pulmonic valve was normal in structure. Pulmonic valve regurgitation is not visualized. Aorta: The aortic root is normal in size and structure. Venous: The inferior vena cava is normal in size with greater than 50% respiratory variability, suggesting right atrial pressure of 3 mmHg. IAS/Shunts: No atrial level shunt detected by color flow Doppler.  LEFT VENTRICLE PLAX 2D LVIDd:         4.20 cm LVIDs:         2.80 cm LV PW:         1.10 cm LV IVS:        1.20 cm LVOT diam:     1.80 cm LV SV:         43 LV SV Index:   22 LVOT Area:     2.54 cm  RIGHT VENTRICLE          IVC RV Basal diam:  2.30 cm  IVC diam: 1.70 cm TAPSE (M-mode): 1.9 cm LEFT ATRIUM             Index        RIGHT ATRIUM          Index LA diam:        2.90 cm 1.48 cm/m   RA Area:     9.02 cm LA Vol (A2C):   18.1 ml 9.23 ml/m   RA Volume:   15.80 ml 8.05 ml/m LA Vol (A4C):   20.1 ml 10.25 ml/m LA Biplane Vol: 19.6 ml 9.99 ml/m  AORTIC VALVE AV Area (Vmax):    1.96 cm AV Area (Vmean):   1.90 cm AV Area (VTI):     1.99 cm AV Vmax:           111.00 cm/s AV Vmean:          74.800 cm/s AV VTI:            0.215 m AV Peak Grad:      4.9 mmHg AV Mean Grad:      3.0 mmHg LVOT Vmax:         85.40 cm/s LVOT Vmean:        55.800 cm/s LVOT VTI:          0.168 m LVOT/AV VTI ratio: 0.78  AORTA Ao Root diam: 2.90 cm MITRAL VALVE MV Area (PHT): 2.64 cm     SHUNTS MV Decel Time: 287 msec    Systemic VTI:  0.17 m MV E velocity: 87.30 cm/s  Systemic Diam: 1.80 cm MV A velocity: 63.50 cm/s MV E/A ratio:  1.37 Dalton McleanMD Electronically signed by Franki Monte Signature Date/Time: 01/21/2022/3:19:24 PM    Final    DG Chest Port 1 View  Result Date: 01/21/2022 CLINICAL DATA:  [id number].  Inpatient chest film. EXAM: PORTABLE CHEST 1 VIEW COMPARISON:  PA Lat 10/24/2008 FINDINGS: The heart size and mediastinal contours are within normal limits. There is new demonstration of a stent in the LAD coronary artery. Both lungs are clear. The visualized skeletal structures are intact, with moderate thoracic spondylosis. IMPRESSION: No evidence of acute chest disease. Electronically Signed   By: Telford Nab M.D.   On: 01/21/2022 06:13   CARDIAC CATHETERIZATION  Result Date: 01/21/2022   Prox LAD lesion is 100% stenosed.   Mid Cx to Dist Cx lesion is 20% stenosed with 20% stenosed side branch in LPAV.   A drug-eluting stent was successfully placed using a SYNERGY XD 3.50X24.   Post intervention, there is a 0% residual stenosis.   LV end diastolic pressure is moderately elevated. Single vessel occlusive CAD involving the proximal LAD Moderately elevated LVEDP 30 mm Hg Successful PCI of the proximal LAD with DES x 1. Plan: DAPT for one year.    Cardiac Studies    Echo 01/21/22: IMPRESSIONS     1. Left ventricular ejection fraction, by estimation, is 30 to 35%. The  left ventricle has moderately decreased function. The left ventricle  demonstrates regional wall motion abnormalities with mid to apical  anterior, anteroseptal, and inferoseptal  akinesis. There is akinesis of the true apex, the apical inferior wall,  and the apical lateral wall. This is suggestive of LAD territory MI. There  is mild left ventricular hypertrophy. Left ventricular diastolic  parameters are indeterminate.   2. Right ventricular systolic function is normal. The right ventricular  size  is normal. Tricuspid regurgitation signal is inadequate for assessing  PA pressure.   3. The mitral valve is normal in structure. Mild mitral valve  regurgitation. No evidence of mitral stenosis.   4. The aortic valve is tricuspid. There is mild calcification of the  aortic valve. Aortic valve regurgitation is not visualized. No aortic  stenosis is present.   5. The inferior vena cava is normal in size with greater than 50%  respiratory variability, suggesting right atrial pressure of 3 mmHg.    Coronary/Graft Acute MI Revascularization  LEFT HEART CATH AND CORONARY ANGIOGRAPHY   Conclusion      Prox LAD lesion is 100% stenosed.   Mid Cx to Dist Cx lesion is 20% stenosed with 20% stenosed side branch in LPAV.   A drug-eluting stent was successfully placed using a SYNERGY XD 3.50X24.   Post intervention, there is a 0% residual stenosis.   LV end diastolic pressure is moderately elevated.   Single vessel occlusive CAD involving the proximal LAD Moderately elevated LVEDP 30 mm Hg Successful PCI of the proximal LAD with DES x 1.   Plan: DAPT for one year Diagnostic Dominance: Left  Intervention   Patient Profile     56 y.o. male presented with acute anterior STEMI  Assessment & Plan     1. Acute anterior STEMI. Patient had 2 days of exertional angina then developed severe pain at rest with anterior ST elevation. Had emergent cardiac cath with occluded proximal LAD. Successful reperfusion with DES. No other significant disease. Troponin initially 65. Repeat >24K. EDP elevated 30 mm Hg. Does not appear volume overloaded today. Repeat Ecg with some persistent ST elevation and Q waves suggestive of significant infarct. Echo  is with EF 30-35%. BP is still soft limiting titration of CHF therapy. Currently on DAPT with ASA and Brilinta. High dose statin. On low dose Coreg. Will need ARB vs Entresto once BP better. Ventricular arrhythmia has improved significantly over the past 24 hours  with resolution of NSVT.  2. Acute systolic CHF. EF 30-35%. No overt volume overload on exam. BP soft limiting medication. Will continue low dose Coreg and add [place]. May be able to add ARB/Entresto if BP improves. Plan repeat Echo in 3 months on optimal medical therapy.  2. Hypercholesterolemia. LDL 145. On high dose statin.    Will transfer to telemetry today and ambulate.   For questions or updates, please contact [facility] Please consult [email or web address] for contact info under        Signed, Thorin Starner [place], MD  01/22/2022, 7:42 AM

## 2022-01-23 ENCOUNTER — Encounter (HOSPITAL_COMMUNITY): Payer: Self-pay | Admitting: Student

## 2022-01-23 ENCOUNTER — Encounter: Payer: Self-pay | Admitting: Student

## 2022-01-23 DIAGNOSIS — I5020 Unspecified systolic (congestive) heart failure: Secondary | ICD-10-CM

## 2022-01-23 DIAGNOSIS — I2102 ST elevation (STEMI) myocardial infarction involving left anterior descending coronary artery: Secondary | ICD-10-CM | POA: Diagnosis not present

## 2022-01-23 LAB — BASIC METABOLIC PANEL
Anion gap: 7 (ref 5–15)
BUN: 11 mg/dL (ref 6–20)
CO2: 25 mmol/L (ref 22–32)
Calcium: 8.7 mg/dL — ABNORMAL LOW (ref 8.9–10.3)
Chloride: 107 mmol/L (ref 98–111)
Creatinine, Ser: 0.92 mg/dL (ref 0.61–1.24)
GFR, Estimated: >60 mL/min (ref >60)
Glucose, Bld: 87 mg/dL (ref 70–99)
Potassium: 3.9 mmol/L (ref 3.5–5.1)
Sodium: 139 mmol/L (ref 135–145)

## 2022-01-23 MED ORDER — ASPIRIN 81 MG PO TBEC
81.0000 mg | DELAYED_RELEASE_TABLET | Freq: Every day | ORAL | Status: DC
Start: 2022-01-24 — End: 2022-01-23

## 2022-01-23 MED ORDER — ASPIRIN 81 MG PO TBEC: 81.0000 mg | DELAYED_RELEASE_TABLET | Freq: Every day | ORAL | 12 refills | Status: DC

## 2022-01-23 MED ORDER — DAPAGLIFLOZIN PROPANEDIOL 10 MG PO TABS: 10.0000 mg | ORAL_TABLET | Freq: Every day | ORAL | 5 refills | Status: DC

## 2022-01-23 MED ORDER — TICAGRELOR 90 MG PO TABS: 90.0000 mg | ORAL_TABLET | Freq: Two times a day (BID) | ORAL | 11 refills | Status: DC

## 2022-01-23 MED ORDER — ATORVASTATIN CALCIUM 80 MG PO TABS: 80.0000 mg | ORAL_TABLET | Freq: Every day | ORAL | 1 refills | Status: DC

## 2022-01-23 MED ORDER — CARVEDILOL 3.125 MG PO TABS: 3.1250 mg | ORAL_TABLET | Freq: Two times a day (BID) | ORAL | 1 refills | Status: DC

## 2022-01-23 MED ORDER — NITROGLYCERIN 0.4 MG SL SUBL: 0.4000 mg | SUBLINGUAL_TABLET | SUBLINGUAL | 2 refills | Status: DC | PRN

## 2022-01-23 NOTE — Plan of Care (Signed)
  Problem: Education: Goal: Knowledge of General Education information will improve Description: Including pain rating scale, medication(s)/side effects and non-pharmacologic comfort measures Outcome: Adequate for Discharge   Problem: Activity: Goal: Risk for activity intolerance will decrease Outcome: Adequate for Discharge   Problem: Nutrition: Goal: Adequate nutrition will be maintained Outcome: Adequate for Discharge   Problem: Pain Managment: Goal: General experience of comfort will improve Outcome: Adequate for Discharge   Problem: Activity: Goal: Ability to tolerate increased activity will improve Outcome: Adequate for Discharge   Problem: Cardiac: Goal: Ability to achieve and maintain adequate cardiovascular perfusion will improve Outcome: Adequate for Discharge

## 2022-01-23 NOTE — Discharge Summary (Addendum)
Discharge Summary    [name] MRN: [name]; DOB: Dec 20, 1965  Admit date: 01/20/2022 Discharge date: 01/23/2022  PCP:  Mosie Lukes, MD   [facility] Providers Cardiologist:  Peter [place], MD   {   Discharge Diagnoses    Principal Problem:   STEMI involving left anterior descending coronary artery [facility]) Active Problems:   HLD (hyperlipidemia)   STEMI (ST elevation myocardial infarction) ([place])   HFrEF (heart failure with reduced ejection fraction) [facility])   Diagnostic Studies/Procedures   Cardiac Catheterization: 01/21/2022   Prox LAD lesion is 100% stenosed.   Mid Cx to Dist Cx lesion is 20% stenosed with 20% stenosed side branch in LPAV.   A drug-eluting stent was successfully placed using a SYNERGY XD 3.50X24.   Post intervention, there is a 0% residual stenosis.   LV end diastolic pressure is moderately elevated.   Single vessel occlusive CAD involving the proximal LAD Moderately elevated LVEDP 30 mm Hg Successful PCI of the proximal LAD with DES x 1.   Plan: DAPT for one year.   Echocardiogram: 01/21/2022 IMPRESSIONS     1. Left ventricular ejection fraction, by estimation, is 30 to 35%. The  left ventricle has moderately decreased function. The left ventricle  demonstrates regional wall motion abnormalities with mid to apical  anterior, anteroseptal, and inferoseptal  akinesis. There is akinesis of the true apex, the apical inferior wall,  and the apical lateral wall. This is suggestive of LAD territory MI. There  is mild left ventricular hypertrophy. Left ventricular diastolic  parameters are indeterminate.   2. Right ventricular systolic function is normal. The right ventricular  size is normal. Tricuspid regurgitation signal is inadequate for assessing  PA pressure.   3. The mitral valve is normal in structure. Mild mitral valve  regurgitation. No evidence of mitral stenosis.   4. The aortic valve is tricuspid. There is  mild calcification of the  aortic valve. Aortic valve regurgitation is not visualized. No aortic  stenosis is present.   5. The inferior vena cava is normal in size with greater than 50%  respiratory variability, suggesting right atrial pressure of 3 mmHg.    History of Present Illness     Michael Gibbs is a 56 y.o. male with no significant past medical history who presented to [facility] on 01/20/2022 as a CODE STEMI.   He reported having progressive chest pain for the past 2 days which would typically occur with exertion but he developed worsening pain the evening of admission which occurred at rest. Also experienced associated dyspnea and diaphoresis. Initial EKG showed ST elevation along the anterolateral leads, therefore CODE STEMI was activated and he went for an emergent cardiac catheterization.   Hospital Course     Consultants: None   This was performed by Dr. [place] and showed 100% stenosis along the proximal LAD which was treated with PCI/DES x1. He did have mild nonobstructive 20% stenosis along the LCx and LVEDP was moderately elevated at 30 mmHg. Was started on ASA and Brilinta along with Coreg and Atorvastatin.   The following morning, he reported still having some chest pain but much improved. Was still having brief episode of NSVT and Hs Troponin had peaked at > 24,000. Echocardiogram showed a reduced EF of 30-35% with multiple WMA as outlined above. His chest pain did improve but medical therapy for his cardiomyopathy remained limited secondary to hypotension. He was started on Farxiga 10mg  daily but BP did not allow for  adding ACE-I, ARB, ARNI or Spironolactone. He was encouraged to follow BP and bring a BP log to his follow-up visit. He Michael Gibbs need a repeat echocardiogram in 3 months for reassessment of his EF. A TOC Visit has been arranged and a staff message sent for his TOC Call. 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 A work-note was provided to remain out  until his follow-up visit. He was discharged home in good condition.    Did the patient have an acute coronary syndrome (MI, NSTEMI, STEMI, etc) this admission?:  Yes                               AHA/ACC Clinical Performance & Quality Measures: Aspirin prescribed? - Yes ADP Receptor Inhibitor (Plavix/Clopidogrel, Brilinta/Ticagrelor or Effient/Prasugrel) prescribed (includes medically managed patients)? - Yes Beta Blocker prescribed? - Yes High Intensity Statin (Lipitor 40-80mg  or Crestor 20-40mg ) prescribed? - Yes EF assessed during THIS hospitalization? - Yes For EF <40%, was ACEI/ARB prescribed? - No - Reason:  Hypotension For EF <40%, Aldosterone Antagonist (Spironolactone or Eplerenone) prescribed? - No - Reason:  Hypotension Cardiac Rehab Phase II ordered (including medically managed patients)? - Yes     The patient Michael Gibbs be scheduled for a TOC follow up appointment in 7-14 days.  A message has been sent to the [facility] and Scheduling Pool at the office where the patient should be seen for follow up.  _____________  Discharge Physical Exam and Vitals Blood pressure 106/69, pulse 77, temperature 98.4 F (36.9 C), temperature source Oral, resp. rate 18, height 5\' 10"  (1.778 m), weight 78.4 kg, SpO2 97 %.  Filed Weights   01/20/22 2300  Weight: 78.4 kg   General: Pleasant male appearing in NAD Psych: Normal affect. Neuro: Alert and oriented X 3. Moves all extremities spontaneously. HEENT: Normal  Neck: Supple without bruits or JVD. Lungs:  Resp regular and unlabored, CTA. Heart: RRR no s3, s4, or murmurs. Abdomen: Soft, non-tender, non-distended, BS + x 4.  Extremities: No clubbing, cyanosis or edema. DP/PT/Radials 2+ and equal bilaterally. Radial site without ecchymosis or evidence of a hematoma.   Labs & Radiologic Studies    CBC Recent Labs    01/20/22 2245 01/20/22 2339 01/21/22 0634  WBC 10.5  --  9.8  NEUTROABS 3.8  --  7.7  HGB 15.8 13.3 13.7  HCT 47.4 39.0  39.6  MCV 84.9  --  83.2  PLT 276  --  235   Basic Metabolic Panel Recent Labs    01/23/22 0634 01/22/22 0052 01/23/22 0143  NA 138 137 139  K 3.7 3.7 3.9  CL 106 107 107  CO2 24 25 25   GLUCOSE 118* 114* 87  BUN 10 8 11   CREATININE 0.79 0.83 0.92  CALCIUM 9.0 8.6* 8.7*  MG 1.8 2.0  --    Liver Function Tests Recent Labs    01/20/22 2245  AST 17  ALT 16  ALKPHOS 80  BILITOT 1.8*  PROT 7.5  ALBUMIN 4.3   Recent Labs    01/20/22 2245  LIPASE 25   High Sensitivity Troponin:   Recent Labs  Lab 01/20/22 2245 01/21/22 0634  TROPONINIHS 65* >24,000*    BNP Invalid input(s): "POCBNP" D-Dimer No results for input(s): "DDIMER" in the last 72 hours. Hemoglobin A1C Recent Labs    01/20/22 2245  HGBA1C 5.2   Fasting Lipid Panel Recent Labs    01/20/22  2245  CHOL 218*  HDL 46  LDLCALC 145*  TRIG 134  CHOLHDL 4.7   Thyroid Function Tests Recent Labs    01/21/22 0634  TSH 1.993   _____________    DG Chest Port 1 View  Result Date: 01/21/2022 CLINICAL DATA:  [id number].  Inpatient chest film. EXAM: PORTABLE CHEST 1 VIEW COMPARISON:  PA Lat 10/24/2008 FINDINGS: The heart size and mediastinal contours are within normal limits. There is new demonstration of a stent in the LAD coronary artery. Both lungs are clear. The visualized skeletal structures are intact, with moderate thoracic spondylosis. IMPRESSION: No evidence of acute chest disease. Electronically Signed   By: Almira Bar M.D.   On: 01/21/2022 06:13   Disposition   Pt is being discharged home today in good condition.  Follow-up Plans & Appointments     Follow-up Information     Corrin Parker, PA-C Follow up on 02/04/2022.   Specialties: Physician Assistant, Cardiology Why: Cardiology Hospital Follow-up on 02/04/2022 at 2:20 PM with Marjie Skiff, PA (works with Dr. [place]) Contact information: [address] [place] [address] [phone]                 Discharge Instructions     Amb Referral to Cardiac Rehabilitation   Complete by: As directed    Diagnosis:  Coronary Stents STEMI PTCA     After initial evaluation and assessments completed: Virtual Based Care may be provided alone or in conjunction with Phase 2 Cardiac Rehab based on patient barriers.: Yes   Diet - low sodium heart healthy   Complete by: As directed    Discharge instructions   Complete by: As directed    PLEASE REMEMBER TO BRING ALL OF YOUR MEDICATIONS TO EACH OF YOUR FOLLOW-UP OFFICE VISITS.  PLEASE ATTEND ALL SCHEDULED FOLLOW-UP APPOINTMENTS.   Activity: Increase activity slowly as tolerated. You may shower, but no soaking baths (or swimming) for 1 week. You may not return to work until cleared by your cardiologist. No lifting over 10 lbs for 4 weeks. No sexual activity for 4 weeks.   Wound Care: You may wash cath site gently with soap and water. Keep cath site clean and dry. If you notice pain, swelling, bleeding or pus at your cath site, please call [phone].   Increase activity slowly   Complete by: As directed        Discharge Medications   Allergies as of 01/23/2022   No Known Allergies      Medication List     TAKE these medications    aspirin EC 81 MG tablet Take 1 tablet (81 mg total) by mouth daily. Swallow whole. Start taking on: January 24, 2022   atorvastatin 80 MG tablet Commonly known as: LIPITOR Take 1 tablet (80 mg total) by mouth daily. Start taking on: January 24, 2022   carvedilol 3.125 MG tablet Commonly known as: COREG Take 1 tablet (3.125 mg total) by mouth 2 (two) times daily with a meal.   dapagliflozin propanediol 10 MG Tabs tablet Commonly known as: FARXIGA Take 1 tablet (10 mg total) by mouth daily. Start taking on: January 24, 2022   nitroGLYCERIN 0.4 MG SL tablet Commonly known as: NITROSTAT Place 1 tablet (0.4 mg total) under the tongue every 5 (five) minutes x 3 doses as needed for chest pain.   ticagrelor 90  MG Tabs tablet Commonly known as: BRILINTA Take 1 tablet (90 mg total) by mouth 2 (two) times daily.  Outstanding Labs/Studies   FLP and LFT's in 6-8 weeks; Echo in 3 months  Duration of Discharge Encounter   Greater than 30 minutes including physician time.  Signed, Ellsworth Lennox, PA-C 01/23/2022, 10:39 AM    I have seen and examined this patient with Michael Gibbs.  Agree with above, note added to reflect my findings.  Patient admitted to hospital with anterior STEMI.  Received drug-eluting stent to the LAD.  Blood pressure has been low and thus unable to titrate heart failure medications.  Currently chest pain-free.  Plan for discharge today with follow-up in clinic.  GEN: Well nourished, well developed, in no acute distress  HEENT: normal  Neck: no JVD, carotid bruits, or masses Cardiac: RRR; no murmurs, rubs, or gallops,no edema  Respiratory:  clear to auscultation bilaterally, normal work of breathing GI: soft, nontender, nondistended, + BS MS: no deformity or atrophy  Skin: warm and dry Neuro:  Strength and sensation are intact Psych: euthymic mood, full affect    Michael Yoshida M. Phinehas Grounds MD 01/23/2022 10:43 AM

## 2022-01-25 ENCOUNTER — Telehealth: Payer: Self-pay | Admitting: Student

## 2022-01-25 NOTE — Telephone Encounter (Signed)
Patient contacted regarding discharge from [facility] on 01/23/22.  Patient understands to follow up with provider Carney Bern, PA on 02/04/22 at 2:20 pm at [facility] . Patient understands discharge instructions? yes Patient understands medications and regiment? yes Patient understands to bring all medications to this visit? yes

## 2022-01-25 NOTE — Telephone Encounter (Signed)
-----   Message from Ellsworth Lennox, [place] sent at 01/23/2022 10:42 AM EDT ----- Regarding: TOC Call Good morning,   This patient will need a TOC call as well for his visit on 02/04/2022.  Thanks,  [place]

## 2022-01-31 NOTE — Progress Notes (Deleted)
Cardiology Office Note:    Date:  01/31/2022   ID:  Michael Gibbs, DOB October 20, 1965, MRN [id number]  PCP:  Bradd Canary, MD  Cardiologist:  Peter Swaziland, MD  Electrophysiologist:  None   Referring MD: Bradd Canary, MD   Chief Complaint: hospital follow-up of STEMI  History of Present Illness:    Michael Gibbs is a 56 y.o. male with a history of CAD with recent STEMI s/p DES to proximal LAD on 01/20/2022, ischemic cardiomyopathy/ chronic systolic CHF with EF of 30-35%, hyperlipidemia, and sleep apnea  who is followed by Dr. Swaziland and presents today for hospital follow-up after STEMI.   Patient was first seen by Cardiology during recent hospitalization. He was admitted from 01/20/2022 to 01/23/2022 for STEMI after presenting with chest pain. High-sensitivity troponin peaked at >24,000. Emergent LHC showed single vessel CAD involving the proximal LAD and moderately elevated LVEDP of 30 mmHg. He underwent successful PCI with DES to the LAD lesion. Echo showed LVEF of 30-35% with akinesis of the apical anterior, anteroseptal, and inferoseptal walls as well as the true apex, apical inferior wall, and apical lateral wall. Patient was started on DAPT with Aspirin and Brilinta as well as a beta-blocker and high-intensity statin. He was also noted to have NSVT on telemetry. GDMT was limited by hypotension so he was not started on ACEi/ARB/ARNI or MRA. However, he was started on Farxiga.  Patient presents today for follow-up. ***  CAD with Recent STEMI Patient was recently admitted in STEMI and was found to have severe single vessel CAD involving the proximal LAD s/p DES.  - No recurrent chest pain. - Continue DAPT with Aspirin and Brilinta.  - Continue beta-blocker and high-intensity statin.  Ischemic Cardiomyopathy Chronic Systolic CHF Echo during recent admission showed LVEF of 30-35% with multiple wall motion abnormalities consistent with LAD infarct. - Euvolemic on  exam. *** - Continue Coreg 3.125mg  twice dail. - Continue Farxiga 10mg  daily. - GDMT limited by hypotension. Losartan *** - Discussed importance of daily weights and sodium/fluid instructions.  - Will repeat BMET today. - Will need repeat Echo in about 3 months to reassess LV function.  Hyperlipidemia Lipid panel during recent admission: Total Cholesterol 218, Triglycerides 134, HDL 46, LDL 145. - Started on Lipitor 80mg  daily during admission. Continue. - Will repeat lipid panel and LFTs in 6-8 weeks.  Sleep Apnea. ***  Past Medical History:  Diagnosis Date   Allergic state 04/25/2007   Seasonal allergies-well controlled with Allegra and Flonase     CAD (coronary artery disease)    a. 12/2021: s/p STEMI with DES to proximal LAD   Dermatitis 09/22/2011   Headache(784.0)    Headache(784.0) 09/22/2011   HFrEF (heart failure with reduced ejection fraction) (HCC)    a. EF 30-35% in 12/2021 in the setting of anterior STEMI   Hyperlipidemia, mixed 08/11/2014   Preventative health care 04/02/2011   Sleep apnea    Sleep apnea     Past Surgical History:  Procedure Laterality Date   CORONARY/GRAFT ACUTE MI REVASCULARIZATION N/A 01/20/2022   Procedure: Coronary/Graft Acute MI Revascularization;  Surgeon: 06/02/2011, Peter M, MD;  Location: [facility] INVASIVE CV LAB;  Service: Cardiovascular;  Laterality: N/A;   LEFT HEART CATH AND CORONARY ANGIOGRAPHY N/A 01/20/2022   Procedure: LEFT HEART CATH AND CORONARY ANGIOGRAPHY;  Surgeon: [facility], Peter M, MD;  Location: [facility] INVASIVE CV LAB;  Service: Cardiovascular;  Laterality: N/A;    Current Medications: No outpatient medications have been marked  as taking for the 02/04/22 encounter (Appointment) with Darreld Mclean, PA-C.     Allergies:   Patient has no known allergies.   Social History   Socioeconomic History   Marital status: Married    Spouse name: Not on file   Number of children: Not on file   Years of education: Not on file   Highest  education level: Not on file  Occupational History   Not on file  Tobacco Use   Smoking status: Never   Smokeless tobacco: Never  Substance and Sexual Activity   Alcohol use: Yes    Comment: rare beer   Drug use: No   Sexual activity: Yes    Comment: lives with wife,  works in [place], vegetarian, wears a seat baelt  Other Topics Concern   Not on file  Social History Narrative   Patient is from [place] but lives in [place], has been in the [place] since 1996, lives with wife and 2 children-ages 64 and 29, wife works from home, her office is in [place]. Remainder family lives in [place]. Patient works in Engineer, technical sales. Enjoys tennis. Vegetarian.   Social Determinants of Health   Financial Resource Strain: Not on file  Food Insecurity: Not on file  Transportation Needs: Not on file  Physical Activity: Not on file  Stress: Not on file  Social Connections: Not on file     Family History: The patient's family history includes Cancer in his paternal grandmother; Depression in his brother; Diabetes in his mother; GER disease in his paternal grandmother; Other in his father; Ulcers in his father.  ROS:   Please see the history of present illness.     EKGs/Labs/Other Studies Reviewed:    The following studies were reviewed today:  Left Cardiac Catheterization 01/20/2022:   Prox LAD lesion is 100% stenosed.   Mid Cx to Dist Cx lesion is 20% stenosed with 20% stenosed side branch in LPAV.   A drug-eluting stent was successfully placed using a SYNERGY XD [id number].   Post intervention, there is a 0% residual stenosis.   LV end diastolic pressure is moderately elevated.   Single vessel occlusive CAD involving the proximal LAD Moderately elevated LVEDP 30 mm Hg Successful PCI of the proximal LAD with DES x 1.  Diagnostic Dominance: Left  Intervention   _______________  Echocardiogram 01/21/2022: Impressions:  1. Left ventricular ejection fraction, by estimation, is 30 to 35%. The   left ventricle has moderately decreased function. The left ventricle  demonstrates regional wall motion abnormalities with mid to apical  anterior, anteroseptal, and inferoseptal  akinesis. There is akinesis of the true apex, the apical inferior wall,  and the apical lateral wall. This is suggestive of LAD territory MI. There  is mild left ventricular hypertrophy. Left ventricular diastolic  parameters are indeterminate.   2. Right ventricular systolic function is normal. The right ventricular  size is normal. Tricuspid regurgitation signal is inadequate for assessing  PA pressure.   3. The mitral valve is normal in structure. Mild mitral valve  regurgitation. No evidence of mitral stenosis.   4. The aortic valve is tricuspid. There is mild calcification of the  aortic valve. Aortic valve regurgitation is not visualized. No aortic  stenosis is present.   5. The inferior vena cava is normal in size with greater than 50%  respiratory variability, suggesting right atrial pressure of 3 mmHg.   EKG:  EKG ordered today. EKG personally reviewed and demonstrates ***.  Recent Labs:  01/20/2022: ALT 16 01/21/2022: Hemoglobin 13.7; Platelets 235; TSH 1.993 01/22/2022: B Natriuretic Peptide 225.6; Magnesium 2.0 01/23/2022: BUN 11; Creatinine, Ser 0.92; Potassium 3.9; Sodium 139  Recent Lipid Panel    Component Value Date/Time   CHOL 218 (H) 01/20/2022 2245   TRIG 134 01/20/2022 2245   HDL 46 01/20/2022 2245   CHOLHDL 4.7 01/20/2022 2245   VLDL 27 01/20/2022 2245   LDLCALC 145 (H) 01/20/2022 2245   LDLCALC 132 (H) 04/15/2020 1155   LDLDIRECT 110 (H) 04/25/2007 1845    Physical Exam:    Vital Signs: There were no vitals taken for this visit.    Wt Readings from Last 3 Encounters:  01/20/22 172 lb 13.5 oz (78.4 kg)  04/16/21 172 lb 12.8 oz (78.4 kg)  04/15/20 171 lb 9.6 oz (77.8 kg)     General: 56 y.o. male in no acute distress. HEENT: Normocephalic and atraumatic. Sclera clear. EOMs  intact. Neck: Supple. No carotid bruits. No JVD. Heart: *** RRR. Distinct S1 and S2. No murmurs, gallops, or rubs. Radial and distal pedal pulses 2+ and equal bilaterally. Lungs: No increased work of breathing. Clear to ausculation bilaterally. No wheezes, rhonchi, or rales.  Abdomen: Soft, non-distended, and non-tender to palpation. Bowel sounds present in all 4 quadrants.  MSK: Normal strength and tone for age. *** Extremities: No lower extremity edema.    Skin: Warm and dry. Neuro: Alert and oriented x3. No focal deficits. Psych: Normal affect. Responds appropriately.   Assessment:    No diagnosis found.  Plan:     Disposition: Follow up in ***   Medication Adjustments/Labs and Tests Ordered: Current medicines are reviewed at length with the patient today.  Concerns regarding medicines are outlined above.  No orders of the defined types were placed in this encounter.  No orders of the defined types were placed in this encounter.   There are no Patient Instructions on file for this visit.   Signed, Corrin Parker, PA-C  01/31/2022 12:34 PM    [facility]

## 2022-02-02 ENCOUNTER — Telehealth: Payer: Self-pay

## 2022-02-02 NOTE — Telephone Encounter (Signed)
Patient called and left a message stating that he is having some "health issues" and wanted to schedule an appointment with Dr. Jacinto Halim. Please give the patient a call to get him scheduled.  Thank you.

## 2022-02-04 ENCOUNTER — Ambulatory Visit: Payer: No Typology Code available for payment source | Admitting: Nurse Practitioner

## 2022-02-04 ENCOUNTER — Encounter: Payer: Self-pay | Admitting: Student

## 2022-02-04 VITALS — BP 102/68 | HR 65 | Ht 69.0 in | Wt 167.6 lb

## 2022-02-04 DIAGNOSIS — I252 Old myocardial infarction: Secondary | ICD-10-CM

## 2022-02-04 DIAGNOSIS — E785 Hyperlipidemia, unspecified: Secondary | ICD-10-CM | POA: Diagnosis not present

## 2022-02-04 DIAGNOSIS — I25118 Atherosclerotic heart disease of native coronary artery with other forms of angina pectoris: Secondary | ICD-10-CM

## 2022-02-04 DIAGNOSIS — I255 Ischemic cardiomyopathy: Secondary | ICD-10-CM | POA: Diagnosis not present

## 2022-02-04 DIAGNOSIS — R5383 Other fatigue: Secondary | ICD-10-CM

## 2022-02-04 DIAGNOSIS — Z955 Presence of coronary angioplasty implant and graft: Secondary | ICD-10-CM | POA: Diagnosis not present

## 2022-02-04 DIAGNOSIS — R42 Dizziness and giddiness: Secondary | ICD-10-CM

## 2022-02-04 DIAGNOSIS — I5020 Unspecified systolic (congestive) heart failure: Secondary | ICD-10-CM

## 2022-02-04 DIAGNOSIS — G4733 Obstructive sleep apnea (adult) (pediatric): Secondary | ICD-10-CM

## 2022-02-04 NOTE — Progress Notes (Addendum)
Cardiology Office Note:    Date:  02/05/2022   ID:  Michael Gibbs, DOB 07-Aug-1965, MRN [name]  PCP:  Mosie Lukes, MD   [place] Providers Cardiologist:  Peter Martinique, MD     Referring MD: Mosie Lukes, MD   CC: Fatigue  History of Present Illness:    Michael Gibbs is a 56 y.o. male with a hx of the following:   CAD, recent STEMI, s/p DES to pLAD 12/2021 HFrEF, ischemic cardiomyopathy, LVEF 30 to 35% on 2D echo 12/2021 OSA Hyperlipidemia Nonallopathic lesion of cervical region, thoracic region, and rib cage   Patient was admitted to the hospital on January 20, 2022 for chest pain.  Had experienced progressive exertional chest pain for 2 days prior to admission, was relieved with rest and night before admission had sudden onset of substernal chest pain, diaphoresis, and shortness of breath that was not relieved with rest, patient came to the ED.  EKG demonstrated anterior-lateral STEMI.  Emergent left heart cath demonstrated 100% proximal LAD occlusion status post DES.  Echocardiogram performed on January 21, 2022 revealed LVEF is 30 to 35%, regional wall motion abnormalities of left ventricle with mid to apical anterior, anteroseptal, and inferoseptal akinesis.  Akinesis found of the true apex, apical inferior wall, and apical lateral wall.  This suggested of MI surrounding the LAD territory.  Mild left ventricular hypertrophy, mild mitral valve regurgitation, mild calcification of the aortic valve, all other findings normal.  Morning after his heart cath he was still reporting some chest pain but this had improved.  He was found to having brief episodes of NSVT therefore troponin peaked to greater than 24,000.  GDMT limited due to hypotension.  Blood pressure did not allow for adding ARB, ACE inhibitor, Entresto, or spironolactone.  Farxiga 10 mg daily was added.  It was suggested that he will need a repeat echocardiogram in 3 months for reassessment of  his ejection fraction.  Was told to bring his blood pressure log to his follow-up visit.  1 view chest x-ray did not reveal any acute chest disease.  Discharged home on January 23, 2022.   Presents for hospital follow-up status post STEMI with DES to proximal LAD.  He states he is doing well from a cardiac perspective, only chief complaint today is fatigue after his heart attack.  Does have a prior history of sleep apnea and stated because he lost weight a previous doctor said he did not need to wear his CPAP any longer. Wife who is present with him for the visit states she does notice that he stops breathing while sleeping at night.  Stop bang score is 3.  Denies any chest pain, shortness of breath, palpitations, swelling or significant weight changes, syncope/presyncope, any acute bleeding, orthopnea/PND, or any other cardiac complaint.  He is weighing himself daily and monitoring his blood pressure closely at home, overall well-controlled.  Does admit to some recent orthostatic dizziness, and is taking his time changing positions to compensate for this.  Has several questions and concerns regarding heart failure and has questions regarding his recent heart attack.  Works from home and would like to know when he can go back to work.  Denies tobacco use, alcohol use, or illicit drug use.  Past Medical History:  Diagnosis Date   Allergic state 04/25/2007   Seasonal allergies-well controlled with Allegra and Flonase     CAD (coronary artery disease)    a. 12/2021: s/p STEMI with DES  to proximal LAD   Dermatitis 09/22/2011   Headache(784.0)    Headache(784.0) 09/22/2011   HFrEF (heart failure with reduced ejection fraction) ([place])    a. EF 30-35% in 12/2021 in the setting of anterior STEMI   Hyperlipidemia, mixed 08/11/2014   Preventative health care 04/02/2011   Sleep apnea    Sleep apnea     Past Surgical History:  Procedure Laterality Date   CORONARY/GRAFT ACUTE MI REVASCULARIZATION N/A  01/20/2022   Procedure: Coronary/Graft Acute MI Revascularization;  Surgeon: Martinique, Peter M, MD;  Location: [facility];  Service: Cardiovascular;  Laterality: N/A;   LEFT HEART CATH AND CORONARY ANGIOGRAPHY N/A 01/20/2022   Procedure: LEFT HEART CATH AND CORONARY ANGIOGRAPHY;  Surgeon: Martinique, Peter M, MD;  Location: [facility];  Service: Cardiovascular;  Laterality: N/A;    Current Medications: Current Meds  Medication Sig   aspirin EC 81 MG tablet Take 1 tablet (81 mg total) by mouth daily. Swallow whole.   atorvastatin (LIPITOR) 80 MG tablet Take 1 tablet (80 mg total) by mouth daily.   carvedilol (COREG) 3.125 MG tablet Take 1 tablet (3.125 mg total) by mouth 2 (two) times daily with a meal.   dapagliflozin propanediol (FARXIGA) 10 MG TABS tablet Take 1 tablet (10 mg total) by mouth daily.   nitroGLYCERIN (NITROSTAT) 0.4 MG SL tablet Place 1 tablet (0.4 mg total) under the tongue every 5 (five) minutes x 3 doses as needed for chest pain.   ticagrelor (BRILINTA) 90 MG TABS tablet Take 1 tablet (90 mg total) by mouth 2 (two) times daily.     Allergies:   Patient has no known allergies.   Social History   Socioeconomic History   Marital status: Married    Spouse name: Not on file   Number of children: Not on file   Years of education: Not on file   Highest education level: Not on file  Occupational History   Not on file  Tobacco Use   Smoking status: Never   Smokeless tobacco: Never  Substance and Sexual Activity   Alcohol use: Yes    Comment: rare beer   Drug use: No   Sexual activity: Yes    Comment: lives with wife,  works in [place], vegetarian, wears a seat baelt  Other Topics Concern   Not on file  Social History Narrative   Patient is from [place] but lives in [place], has been in the [place] since 1996, lives with wife and 2 children-ages 68 and 21, wife works from home, her office is in [place]. Remainder family lives in [place]. Patient works in  Engineer, technical sales. Enjoys tennis. Vegetarian.   Social Determinants of Health   Financial Resource Strain: Not on file  Food Insecurity: Not on file  Transportation Needs: Not on file  Physical Activity: Not on file  Stress: Not on file  Social Connections: Not on file     Family History: The patient's family history includes Cancer in his paternal grandmother; Depression in his brother; Diabetes in his mother; GER disease in his paternal grandmother; Other in his father; Ulcers in his father.  ROS:   Review of Systems  Constitutional:  Positive for malaise/fatigue and weight loss. Negative for chills, diaphoresis and fever.  HENT: Negative.    Eyes: Negative.   Respiratory: Negative.         See HPI.  Cardiovascular: Negative.   Gastrointestinal: Negative.   Genitourinary: Negative.   Musculoskeletal: Negative.   Skin:  Negative.   Neurological:  Positive for dizziness. Negative for tingling, tremors, sensory change, speech change, focal weakness, seizures, loss of consciousness, weakness and headaches.       See HPI.  Endo/Heme/Allergies: Negative.   Psychiatric/Behavioral: Negative.     Please see the history of present illness.    All other systems reviewed and are negative.  EKGs/Labs/Other Studies Reviewed:    The following studies were reviewed today:   EKG:  EKG is ordered today.  The ekg ordered today demonstrates NSR, rightward axis, 65 bpm, previous anteroseptal infarct, age underdetermined, T wave abnormality, otherwise no acute changes.   2D complete echo on January 21, 2022: Left ventricular ejection fraction, by estimation, is 30 to 35%. The left ventricle has moderately decreased function. The left ventricle demonstrates regional wall motion abnormalities with mid to apical anterior, anteroseptal, and inferoseptal akinesis. There is akinesis of the true apex, the apical inferior wall, and the apical lateral wall. This is suggestive of LAD territory MI. There is mild left  ventricular hypertrophy. Left ventricular diastolic parameters are indeterminate. 2. Right ventricular systolic function is normal. The right ventricular size is normal. Tricuspid regurgitation signal is inadequate for assessing PA pressure. 3. The mitral valve is normal in structure. Mild mitral valve regurgitation. No evidence of mitral stenosis. 4. The aortic valve is tricuspid. There is mild calcification of the aortic valve. Aortic valve regurgitation is not visualized. No aortic stenosis is present. 5. The inferior vena cava is normal in size with greater than 50% respiratory variability, suggesting right atrial pressure of 3 mmHg.  Left heart catheterization on January 20, 2022:   Prox LAD lesion is 100% stenosed.   Mid Cx to Dist Cx lesion is 20% stenosed with 20% stenosed side branch in LPAV.   A drug-eluting stent was successfully placed using a SYNERGY XD [id number].   Post intervention, there is a 0% residual stenosis.   LV end diastolic pressure is moderately elevated.   Single vessel occlusive CAD involving the proximal LAD Moderately elevated LVEDP 30 mm Hg Successful PCI of the proximal LAD with DES x 1.   Plan: DAPT for one year.  Recent Labs: 01/20/2022: ALT 16 01/21/2022: Hemoglobin 13.7; Platelets 235; TSH 1.993 01/22/2022: B Natriuretic Peptide 225.6; Magnesium 2.0 02/04/2022: BUN 9; Creatinine, Ser 0.87; Potassium 4.4; Sodium 141  Recent Lipid Panel    Component Value Date/Time   CHOL 218 (H) 01/20/2022 2245   TRIG 134 01/20/2022 2245   HDL 46 01/20/2022 2245   CHOLHDL 4.7 01/20/2022 2245   VLDL 27 01/20/2022 2245   LDLCALC 145 (H) 01/20/2022 2245   LDLCALC 132 (H) 04/15/2020 1155   LDLDIRECT 110 (H) 04/25/2007 1845     Risk Assessment/Calculations:      STOP-Bang Score:  3       Physical Exam:    VS:  BP 102/68   Pulse 65   Ht 5\' 9"  (1.753 m)   Wt 167 lb 9.6 oz (76 kg)   SpO2 100%   BMI 24.75 kg/m     Wt Readings from Last 3 Encounters:  02/04/22 167  lb 9.6 oz (76 kg)  01/20/22 172 lb 13.5 oz (78.4 kg)  04/16/21 172 lb 12.8 oz (78.4 kg)     GEN: Well nourished, well developed in no acute distress HEENT: Normal NECK: No JVD; No carotid bruits CARDIAC: RRR, no murmurs, rubs, gallops; 2+ peripheral pulses throughout, strong and equal bilaterally; right radial site is clean, dry, and intact without any  redness, erythema, swelling, or drainage. RESPIRATORY:  Clear and diminished to auscultation without rales, wheezing or rhonchi  ABDOMEN: Soft, non-tender, non-distended, bowel sounds x 4 MUSCULOSKELETAL:  No edema; No deformity  SKIN: Warm and dry NEUROLOGIC:  Alert and oriented x 3 PSYCHIATRIC:  Normal affect   ASSESSMENT:    1. Coronary artery disease of native artery of native heart with stable angina pectoris (HCC)   2. History of ST elevation myocardial infarction (STEMI)   3. S/P coronary artery stent placement   4. Hyperlipidemia, unspecified hyperlipidemia type   5. HFrEF (heart failure with reduced ejection fraction) (HCC)   6. Ischemic cardiomyopathy   7. OSA (obstructive sleep apnea)    PLAN:    In order of problems listed above:  Coronary artery disease, recent STEMI s/p DES to proximal LAD 12/2021, hyperlipidemia with LDL goal less than 70 - chronic, stable Stable with no recurrent chest pain since left heart cath.  EKG reveals normal sinus rhythm, 65 bpm, previous anteroseptal infarct from previous STEMI, with T wave abnormality, otherwise no acute changes.  Okay to start cardiac rehab.  Discussed DAPT for minimum of 1 year.  Continue Brilinta 90 mg twice daily and aspirin 81 mg daily.  Continue Lipitor 80 mg daily, carvedilol 3.125 mg twice daily, and nitroglycerin 0.4 mg sublingual as needed for chest pain.  Last LDL on January 20, 2022 was not at goal at 145.  Since recently starting Lipitor 80 mg daily, will obtain fasting lipid panel and liver function enzymes in 2 months.  ED care discussed for any sign or symptom of  acute coronary syndrome Drom and he verbalizes understanding.  Will obtain BMET today to reevaluate kidney function.  2.  Ischemic cardiomyopathy due to recent STEMI, HFrEF, LVEF 30 to 35% LVEF after recent STEMI was 30 to 35%, regional wall motion abnormalities with mid to apical anterior, anteroseptal, and inferoseptal akinesis.  There is akinesis of the true apex, the apical inferior wall, and the apical lateral wall, suggestive of LAD territory MI.  Euvolemic and well compensated on exam.  Low sodium diet, fluid restriction <2L, and daily weights encouraged. Educated to contact our office for weight gain of 2 lbs overnight or 5 lbs in one week.  Discussed GDMT medications and titration.  However with patient's low normal blood pressure of 102/68, I am hesitant to uptitrate GDMT at this time.  Continue Farxiga 10 mg daily and carvedilol 3.125 mg twice daily.  Will repeat echocardiogram in 2 months to assess LVEF. If LVEF is not improved, plan to optimize GDMT.  3.  Fatigue -acute, stable 4.  OSA-chronic, not treated Etiology most likely recent STEMI/untreated OSA.  STOP-BANG score 3 today.  It has been over a year and a half since his last sleep study, will arrange repeat sleep study.  Heart healthy diet and 150 minutes of moderate intensity activity encouraged.  Okay to start cardiac rehab.  5.  Orthostatic dizziness-acute, stable States sometimes he notices feeling lightheaded when changing positions.  Denies syncope, presyncope, or vertigo.  Blood pressure log does not reveal any orthostatic hypotension readings.  Discussed conservative measures of changing positions slowly, wearing compression stockings, and adequate hydration between 32 to 64 fluid ounces per day.  He verbalizes understanding.   6.  Disposition: Follow-up in 3 months with Dr. Swaziland or sooner if anything changes    Medication Adjustments/Labs and Tests Ordered: Current medicines are reviewed at length with the patient today.   Concerns regarding medicines are  outlined above.  Orders Placed This Encounter  Procedures   Basic metabolic panel   Lipid panel   Hepatic function panel   EKG 12-Lead   ECHOCARDIOGRAM COMPLETE   No orders of the defined types were placed in this encounter.   Patient Instructions  Medication Instructions:  No Changes *If you need a refill on your cardiac medications before your next appointment, please call your pharmacy*   Lab Work: BMET Today Lipid Panel, Hepatic Panel 2 months If you have labs (blood work) drawn today and your tests are completely normal, you will receive your results only by: MyChart Message (if you have MyChart) OR A paper copy in the mail If you have any lab test that is abnormal or we need to change your treatment, we will call you to review the results.   Testing/Procedures: [address]. Your physician has requested that you have an echocardiogram. Echocardiography is a painless test that uses sound waves to create images of your heart. It provides your doctor with information about the size and shape of your heart and how well your heart's chambers and valves are working. This procedure takes approximately one hour. There are no restrictions for this procedure.    Follow-Up: At [facility], you and your health needs are our priority.  As part of our continuing mission to provide you with exceptional heart care, we have created designated Provider Care Teams.  These Care Teams include your primary Cardiologist (physician) and Advanced Practice Providers (APPs -  Physician Assistants and Nurse Practitioners) who all work together to provide you with the care you need, when you need it.  We recommend signing up for the patient portal called "MyChart".  Sign up information is provided on this After Visit Summary.  MyChart is used to connect with patients for Virtual Visits (Telemedicine).  Patients are able to view lab/test results,  encounter notes, upcoming appointments, etc.  Non-urgent messages can be sent to your provider as well.   To learn more about what you can do with MyChart, go to [email or web address].    Your next appointment:   3 month(s)  The format for your next appointment:   In Person  Provider:   Peter Swaziland, MD       Important Information About Sugar         Signed, Sharlene Dory, NP  02/05/2022 6:47 AM    [facility]

## 2022-02-04 NOTE — Patient Instructions (Signed)
Medication Instructions:  No Changes *If you need a refill on your cardiac medications before your next appointment, please call your pharmacy*   Lab Work: BMET Today Lipid Panel, Hepatic Panel 2 months If you have labs (blood work) drawn today and your tests are completely normal, you will receive your results only by: MyChart Message (if you have MyChart) OR A paper copy in the mail If you have any lab test that is abnormal or we need to change your treatment, we will call you to review the results.   Testing/Procedures: [address]. Your physician has requested that you have an echocardiogram. Echocardiography is a painless test that uses sound waves to create images of your heart. It provides your doctor with information about the size and shape of your heart and how well your heart's chambers and valves are working. This procedure takes approximately one hour. There are no restrictions for this procedure.    Follow-Up: At [facility], you and your health needs are our priority.  As part of our continuing mission to provide you with exceptional heart care, we have created designated Provider Care Teams.  These Care Teams include your primary Cardiologist (physician) and Advanced Practice Providers (APPs -  Physician Assistants and Nurse Practitioners) who all work together to provide you with the care you need, when you need it.  We recommend signing up for the patient portal called "MyChart".  Sign up information is provided on this After Visit Summary.  MyChart is used to connect with patients for Virtual Visits (Telemedicine).  Patients are able to view lab/test results, encounter notes, upcoming appointments, etc.  Non-urgent messages can be sent to your provider as well.   To learn more about what you can do with MyChart, go to [email or web address].    Your next appointment:   3 month(s)  The format for your next appointment:   In Person  Provider:    Peter Swaziland, MD       Important Information About Sugar

## 2022-02-05 ENCOUNTER — Encounter: Payer: Self-pay | Admitting: Nurse Practitioner

## 2022-02-05 DIAGNOSIS — R5383 Other fatigue: Secondary | ICD-10-CM | POA: Insufficient documentation

## 2022-02-05 DIAGNOSIS — R42 Dizziness and giddiness: Secondary | ICD-10-CM | POA: Insufficient documentation

## 2022-02-05 DIAGNOSIS — I255 Ischemic cardiomyopathy: Secondary | ICD-10-CM | POA: Insufficient documentation

## 2022-02-05 LAB — BASIC METABOLIC PANEL
BUN/Creatinine Ratio: 10 (ref 9–20)
BUN: 9 mg/dL (ref 6–24)
CO2: 23 mmol/L (ref 20–29)
Calcium: 9.2 mg/dL (ref 8.7–10.2)
Chloride: 103 mmol/L (ref 96–106)
Creatinine, Ser: 0.87 mg/dL (ref 0.76–1.27)
Glucose: 85 mg/dL (ref 70–99)
Potassium: 4.4 mmol/L (ref 3.5–5.2)
Sodium: 141 mmol/L (ref 134–144)
eGFR: 101 mL/min/{1.73_m2} (ref >59)

## 2022-02-09 ENCOUNTER — Other Ambulatory Visit: Payer: Self-pay

## 2022-02-09 ENCOUNTER — Telehealth: Payer: Self-pay | Admitting: Cardiology

## 2022-02-09 DIAGNOSIS — G4733 Obstructive sleep apnea (adult) (pediatric): Secondary | ICD-10-CM

## 2022-02-09 NOTE — Telephone Encounter (Signed)
I have ordered Itamar sleep study.  Kennyth Arnold- can you reach out to him and give him the information for this?  Thanks!

## 2022-02-09 NOTE — Telephone Encounter (Signed)
Patient calling in regards to sleep study- I did not see one ordered- but seen note: 4.  OSA-chronic, not treated Etiology most likely recent STEMI/untreated OSA.  STOP-BANG score 3 today.  It has been over a year and a half since his last sleep study, will arrange repeat sleep study.  Heart healthy diet and 150 minutes of moderate intensity activity encouraged.  Okay to start cardiac rehab.  Would you like to order itamar? Or home sleep study?   Thank you!

## 2022-02-09 NOTE — Telephone Encounter (Signed)
Patient called to follow-up on getting a Sleep Study done.

## 2022-02-10 ENCOUNTER — Telehealth: Payer: Self-pay | Admitting: Cardiology

## 2022-02-10 DIAGNOSIS — I25118 Atherosclerotic heart disease of native coronary artery with other forms of angina pectoris: Secondary | ICD-10-CM

## 2022-02-10 NOTE — Telephone Encounter (Signed)
Pt c/o of Chest Pain: STAT if CP now or developed within 24 hours  1. Are you having CP right now? Yes   2. Are you experiencing any other symptoms (ex. SOB, nausea, vomiting, sweating)? SOB, sweating last night  3. How long have you been experiencing CP? Last night  4. Is your CP continuous or coming and going? Continuous   5. Have you taken Nitroglycerin? No  ?

## 2022-02-10 NOTE — Telephone Encounter (Signed)
Spoke to patient  He states he has some chest tightness yesterday evening, it returned today . Lasting 30- 40 min  before calling the office  today. patient states  he is still having tightness the pain scale is 2/3 out of 10. Located  center left chest. Some shortness of breathe with light " movement /walking. "Kinda of fell like it does when you are anxious"  No audible shortness of breath while he was talking to the  patient.   No changes  with sitting. RN asked patient if he used any NTG for this tightness--  patient stated no RN asked patient to go get the NTG and use medication. Patient states the discomfort was very mild .-  RN waited with patient on the phone for 5 minutes while patient took medication  Patient states discomfort is milder and barely there. Patient  had an office visit  02/04/22.  Patient states this symptoms occurred after this visit.   RN informed patient will defer to  Dr Swaziland and/or Sharlene Dory NP  Patient voiced understanding

## 2022-02-11 ENCOUNTER — Telehealth: Payer: Self-pay | Admitting: *Deleted

## 2022-02-11 MED ORDER — LOSARTAN POTASSIUM 25 MG PO TABS: 25.0000 mg | ORAL_TABLET | Freq: Every day | ORAL | 3 refills | Status: DC

## 2022-02-11 NOTE — Telephone Encounter (Signed)
Staff message sen to Roxanne Mins ok to activate itamar.

## 2022-02-11 NOTE — Telephone Encounter (Signed)
Error

## 2022-02-11 NOTE — Telephone Encounter (Signed)
Open in error

## 2022-02-11 NOTE — Telephone Encounter (Signed)
   I would not start nitrates since he is not having angina. Would focus more on optimizing CHF therapy. Would ideally like to get him on Entresto but his BP is soft. I would start with losartan 25 mg daily. Continue Coreg and [place]. If able to tolerate losartan we could transition later to [facility].   Michael Swaziland MD, [facility]  Gibbs, Michael M, MD  Sharlene Dory, NP 2 hours ago (8:11 AM)     Saw this patient 1 week ago and was doing well from a cardiac perspective. Denied any CP at this time. Had recent STEMI and LHC and received PCI DES to pLAD end of last month. Symptoms could be vasospastic in nature. Antianginal is nitroglycerin. Was euvolemic and well compensated on exam. LVEF after STEMI is 30-35%. Maybe need to consider Imdur 15 mg daily to his regimen; however I am hesitant as his blood pressures have been low normal (BP that day in office was 102/68). Was hesitant to uptitrate his GDMT due to his BP. Ranexa may be another option for this patient.   Dr. Swaziland, what are your thoughts?   Thanks so much!   Kind Regards,   Sharlene Dory, NP  You; Gibbs, Michael M, MD 20 hours ago (2:38 PM)

## 2022-02-11 NOTE — Telephone Encounter (Signed)
Pt returning a call.

## 2022-02-11 NOTE — Telephone Encounter (Signed)
Called pt back with message from Dr. Swaziland per RN's note. Losartan 25mg  daily sent to pt's pharmacy of choice. Pt mentions some left leg pain that has been bothering him for about a week now. It aches some on the back side of his knee. Explained that I would mention this to Dr. . Also suggested that pt discuss with PCP. Pt verbalizes understanding.

## 2022-02-11 NOTE — Telephone Encounter (Signed)
RN spoke to Dr Swaziland -  Have patient to start Losartan 25 mg  daily.   RN called patient - no answer - left voicemail message to call back for recommendation

## 2022-02-18 NOTE — Telephone Encounter (Signed)
Sharlene Dory, NP  Bernita Buffy, RN Let's recheck BMET in one week since he is being started on Losartan to evaluate kidney function and potassium level.   Thanks!    Spoke with pt regarding lab work. Pt will plan to get this done on Monday at [facility] office. Pt verbalizes understanding.

## 2022-02-18 NOTE — Addendum Note (Signed)
Addended by: Bernita Buffy on: 02/18/2022 11:58 AM   Modules accepted: Orders

## 2022-02-22 LAB — BASIC METABOLIC PANEL
BUN/Creatinine Ratio: 10 (ref 9–20)
BUN: 9 mg/dL (ref 6–24)
CO2: 25 mmol/L (ref 20–29)
Calcium: 9.7 mg/dL (ref 8.7–10.2)
Chloride: 106 mmol/L (ref 96–106)
Creatinine, Ser: 0.92 mg/dL (ref 0.76–1.27)
Glucose: 86 mg/dL (ref 70–99)
Potassium: 4.6 mmol/L (ref 3.5–5.2)
Sodium: 142 mmol/L (ref 134–144)
eGFR: 97 mL/min/{1.73_m2} (ref >59)

## 2022-02-22 LAB — HEPATIC FUNCTION PANEL
ALT: 31 IU/L (ref 0–44)
AST: 27 IU/L (ref 0–40)
Albumin: 4.5 g/dL (ref 3.8–4.9)
Alkaline Phosphatase: 97 IU/L (ref 44–121)
Bilirubin Total: 3.2 mg/dL — ABNORMAL HIGH (ref 0.0–1.2)
Bilirubin, Direct: 0.18 mg/dL (ref 0.00–0.40)
Total Protein: 6.8 g/dL (ref 6.0–8.5)

## 2022-02-22 LAB — LIPID PANEL
Chol/HDL Ratio: 3 ratio (ref 0.0–5.0)
Cholesterol, Total: 99 mg/dL — ABNORMAL LOW (ref 100–199)
HDL: 33 mg/dL — ABNORMAL LOW (ref >39)
LDL Chol Calc (NIH): 49 mg/dL (ref 0–99)
Triglycerides: 82 mg/dL (ref 0–149)
VLDL Cholesterol Cal: 17 mg/dL (ref 5–40)

## 2022-02-23 NOTE — Telephone Encounter (Signed)
LMTCB

## 2022-02-24 ENCOUNTER — Telehealth: Payer: Self-pay | Admitting: Cardiology

## 2022-02-24 DIAGNOSIS — R899 Unspecified abnormal finding in specimens from other organs, systems and tissues: Secondary | ICD-10-CM

## 2022-02-24 DIAGNOSIS — I255 Ischemic cardiomyopathy: Secondary | ICD-10-CM

## 2022-02-24 DIAGNOSIS — R5383 Other fatigue: Secondary | ICD-10-CM

## 2022-02-24 NOTE — Telephone Encounter (Signed)
Called pt, went over lab results and Vear Clock recommendations. Pt denies yellowing of skin or eyes but does report foamy urine for about 2 months. He also reports clay-colored stool and a black substance around the toilet after using the restroom for 2-3 weeks. He also reports left sided chest pain sometimes. He rates the pain 2-3 out of 10. He wonders if he should take the NTG when he has this pain since it is very mild. Pt advised to take the NTG when he has this pain to see if it will relieve the pain. Pt states the chest pain lasts about 2-3 hours sometimes less time.  Pt verbalized understanding about taking NTG and coming to get labs drawn next week and in 3 months. No further questions at this time. Orders placed for lab work.

## 2022-02-24 NOTE — Telephone Encounter (Signed)
Patient is returning RN's call. Please advise. 

## 2022-03-02 ENCOUNTER — Telehealth: Payer: Self-pay | Admitting: Nurse Practitioner

## 2022-03-02 NOTE — Telephone Encounter (Signed)
S/W patient regarding elevated bilirubin level.  Liver function enzyme has increased since last check.  Discussed differential diagnoses with him and stated he is complaining of dark color stools, says stool looks black and this has been occurring for the past several weeks, also complaining of stomachache.  I discussed that several things could be causing this, and one differential diagnosis includes GI bleed, also I am considering another possible diagnosis of [name] syndrome.  I advised him that he should seek immediate treatment for this, recommended that he visit the [facility] location, where blood work could be done and Hemoccult could be checked.  He has not seen GI doctor before so would need GI referral as well.  I recommended this to him as he said it is hard to get into his PCP on time.  He was not jaundiced at last office visit and denies any yellowing of his eyes or skin.  He verbalized understanding of our phone call conversation and was appreciative of my call.  Recommend the following to be done at [facility]: -CBC with diff/blood smear - Bilirubin test - Hemoccult stool sample - GI referral - may need colonoscopy  Sharlene Dory, NP

## 2022-03-03 ENCOUNTER — Other Ambulatory Visit: Payer: Self-pay | Admitting: Family Medicine

## 2022-03-03 ENCOUNTER — Telehealth: Payer: Self-pay | Admitting: Family Medicine

## 2022-03-03 DIAGNOSIS — R17 Unspecified jaundice: Secondary | ICD-10-CM

## 2022-03-03 DIAGNOSIS — R350 Frequency of micturition: Secondary | ICD-10-CM

## 2022-03-03 NOTE — Telephone Encounter (Signed)
Called pt and lab appt was made 

## 2022-03-03 NOTE — Telephone Encounter (Signed)
Patient's wife called to say that Dr. Swaziland at [facility] said his bilirubin is high and that he should follow up with his PCP to have the labs redone. Patient's wife is also requesting a urine sample to be done because she is concerned that he might have excess sugar in his urine. Patient urinates and flushes and then within a few days there is black mold around the ring. Scheduled patient a follow up appointment on 03/05/2022 but wife, Alric Quan, would like to have him come in today or tomorrow to have the labs/urine sample done so they can advise at the appointment. Please call [phone] to advise.

## 2022-03-04 ENCOUNTER — Other Ambulatory Visit (INDEPENDENT_AMBULATORY_CARE_PROVIDER_SITE_OTHER): Payer: No Typology Code available for payment source

## 2022-03-04 ENCOUNTER — Ambulatory Visit: Payer: No Typology Code available for payment source | Admitting: Cardiology

## 2022-03-04 DIAGNOSIS — R17 Unspecified jaundice: Secondary | ICD-10-CM | POA: Diagnosis not present

## 2022-03-04 DIAGNOSIS — R350 Frequency of micturition: Secondary | ICD-10-CM

## 2022-03-04 LAB — COMPREHENSIVE METABOLIC PANEL
ALT: 35 U/L (ref 0–53)
AST: 29 U/L (ref 0–37)
Albumin: 4.1 g/dL (ref 3.5–5.2)
Alkaline Phosphatase: 83 U/L (ref 39–117)
BUN: 9 mg/dL (ref 6–23)
CO2: 28 mEq/L (ref 19–32)
Calcium: 9.1 mg/dL (ref 8.4–10.5)
Chloride: 105 mEq/L (ref 96–112)
Creatinine, Ser: 0.85 mg/dL (ref 0.40–1.50)
GFR: 96.67 mL/min (ref >60.00)
Glucose, Bld: 84 mg/dL (ref 70–99)
Potassium: 4.4 mEq/L (ref 3.5–5.1)
Sodium: 140 mEq/L (ref 135–145)
Total Bilirubin: 3.1 mg/dL — ABNORMAL HIGH (ref 0.2–1.2)
Total Protein: 6.7 g/dL (ref 6.0–8.3)

## 2022-03-04 LAB — URINALYSIS
Bilirubin Urine: NEGATIVE
Hgb urine dipstick: NEGATIVE
Ketones, ur: NEGATIVE
Leukocytes,Ua: NEGATIVE
Nitrite: NEGATIVE
Specific Gravity, Urine: 1.02 (ref 1.000–1.030)
Total Protein, Urine: NEGATIVE
Urine Glucose: >=1000 — AB
Urobilinogen, UA: 0.2 (ref 0.0–1.0)
pH: 5.5 (ref 5.0–8.0)

## 2022-03-04 LAB — CBC WITH DIFFERENTIAL/PLATELET
Basophils Absolute: 0 10*3/uL (ref 0.0–0.1)
Basophils Relative: 0.5 % (ref 0.0–3.0)
Eosinophils Absolute: 0.3 10*3/uL (ref 0.0–0.7)
Eosinophils Relative: 5.6 % — ABNORMAL HIGH (ref 0.0–5.0)
HCT: 40.1 % (ref 39.0–52.0)
Hemoglobin: 13.4 g/dL (ref 13.0–17.0)
Lymphocytes Relative: 33 % (ref 12.0–46.0)
Lymphs Abs: 2 10*3/uL (ref 0.7–4.0)
MCHC: 33.5 g/dL (ref 30.0–36.0)
MCV: 84.7 fl (ref 78.0–100.0)
Monocytes Absolute: 0.5 10*3/uL (ref 0.1–1.0)
Monocytes Relative: 7.7 % (ref 3.0–12.0)
Neutro Abs: 3.2 10*3/uL (ref 1.4–7.7)
Neutrophils Relative %: 53.2 % (ref 43.0–77.0)
Platelets: 198 10*3/uL (ref 150.0–400.0)
RBC: 4.74 Mil/uL (ref 4.22–5.81)
RDW: 13.7 % (ref 11.5–15.5)
WBC: 6 10*3/uL (ref 4.0–10.5)

## 2022-03-05 ENCOUNTER — Other Ambulatory Visit: Payer: Self-pay

## 2022-03-05 ENCOUNTER — Telehealth: Payer: Self-pay | Admitting: Family Medicine

## 2022-03-05 ENCOUNTER — Other Ambulatory Visit: Payer: Self-pay | Admitting: Family Medicine

## 2022-03-05 ENCOUNTER — Other Ambulatory Visit (INDEPENDENT_AMBULATORY_CARE_PROVIDER_SITE_OTHER): Payer: No Typology Code available for payment source

## 2022-03-05 ENCOUNTER — Ambulatory Visit: Payer: No Typology Code available for payment source | Admitting: Family

## 2022-03-05 DIAGNOSIS — R109 Unspecified abdominal pain: Secondary | ICD-10-CM

## 2022-03-05 DIAGNOSIS — R194 Change in bowel habit: Secondary | ICD-10-CM

## 2022-03-05 DIAGNOSIS — R17 Unspecified jaundice: Secondary | ICD-10-CM | POA: Diagnosis not present

## 2022-03-05 LAB — HEMOGLOBIN A1C: Hgb A1c MFr Bld: 5.6 % (ref 4.6–6.5)

## 2022-03-05 LAB — URINE CULTURE
MICRO NUMBER:: 13885311
SPECIMEN QUALITY:: ADEQUATE

## 2022-03-05 NOTE — Telephone Encounter (Signed)
Patient's wife, Alric Quan, called to advise that patient needs abdominal ultrasound and A1C labs done per [employer]. Please place orders for both and call to advise so they can schedule appointments. Wife wants to do the vv after both of those are done so Dr. Abner Greenspan can discuss all at same time.

## 2022-03-05 NOTE — Telephone Encounter (Signed)
Called pt to see about his symptoms of abdominal pain and pt states having black stool  The abdomen pain comes and goes .

## 2022-03-08 ENCOUNTER — Other Ambulatory Visit: Payer: Self-pay

## 2022-03-08 ENCOUNTER — Encounter: Payer: Self-pay | Admitting: Family Medicine

## 2022-03-08 DIAGNOSIS — R350 Frequency of micturition: Secondary | ICD-10-CM

## 2022-03-08 DIAGNOSIS — R17 Unspecified jaundice: Secondary | ICD-10-CM

## 2022-03-08 NOTE — Telephone Encounter (Signed)
Called appt and lab are order

## 2022-03-08 NOTE — Addendum Note (Signed)
Addended by: Rosita Kea on: 03/08/2022 12:37 PM   Modules accepted: Orders

## 2022-03-09 ENCOUNTER — Other Ambulatory Visit: Payer: No Typology Code available for payment source

## 2022-03-09 NOTE — Addendum Note (Signed)
Addended by: Mervin Kung A on: 03/09/2022 08:45 AM   Modules accepted: Orders

## 2022-03-11 ENCOUNTER — Ambulatory Visit: Payer: No Typology Code available for payment source | Admitting: Family

## 2022-03-11 ENCOUNTER — Ambulatory Visit: Payer: No Typology Code available for payment source

## 2022-03-11 ENCOUNTER — Ambulatory Visit (HOSPITAL_BASED_OUTPATIENT_CLINIC_OR_DEPARTMENT_OTHER)
Admission: RE | Admit: 2022-03-11 | Discharge: 2022-03-11 | Disposition: A | Discharge status: Home / Self Care | Payer: No Typology Code available for payment source | Source: Ambulatory Visit | Attending: Family Medicine | Admitting: Family Medicine

## 2022-03-11 ENCOUNTER — Encounter: Payer: Self-pay | Admitting: Family

## 2022-03-11 VITALS — BP 90/60 | HR 63 | Temp 97.9°F | Ht 69.0 in | Wt 160.0 lb

## 2022-03-11 DIAGNOSIS — R899 Unspecified abnormal finding in specimens from other organs, systems and tissues: Secondary | ICD-10-CM | POA: Diagnosis not present

## 2022-03-11 DIAGNOSIS — Z23 Encounter for immunization: Secondary | ICD-10-CM | POA: Diagnosis not present

## 2022-03-11 DIAGNOSIS — M791 Myalgia, unspecified site: Secondary | ICD-10-CM | POA: Diagnosis not present

## 2022-03-11 DIAGNOSIS — R17 Unspecified jaundice: Secondary | ICD-10-CM | POA: Diagnosis present

## 2022-03-11 DIAGNOSIS — R109 Unspecified abdominal pain: Secondary | ICD-10-CM | POA: Insufficient documentation

## 2022-03-11 DIAGNOSIS — I951 Orthostatic hypotension: Secondary | ICD-10-CM | POA: Diagnosis not present

## 2022-03-11 DIAGNOSIS — R194 Change in bowel habit: Secondary | ICD-10-CM | POA: Diagnosis present

## 2022-03-11 LAB — CBC WITH DIFFERENTIAL/PLATELET
Basophils Absolute: 0 10*3/uL (ref 0.0–0.1)
Basophils Relative: 0.6 % (ref 0.0–3.0)
Eosinophils Absolute: 0.3 10*3/uL (ref 0.0–0.7)
Eosinophils Relative: 5.2 % — ABNORMAL HIGH (ref 0.0–5.0)
HCT: 41.9 % (ref 39.0–52.0)
Hemoglobin: 13.8 g/dL (ref 13.0–17.0)
Lymphocytes Relative: 35.2 % (ref 12.0–46.0)
Lymphs Abs: 2.1 10*3/uL (ref 0.7–4.0)
MCHC: 33 g/dL (ref 30.0–36.0)
MCV: 86.4 fl (ref 78.0–100.0)
Monocytes Absolute: 0.5 10*3/uL (ref 0.1–1.0)
Monocytes Relative: 7.9 % (ref 3.0–12.0)
Neutro Abs: 3 10*3/uL (ref 1.4–7.7)
Neutrophils Relative %: 51.1 % (ref 43.0–77.0)
Platelets: 210 10*3/uL (ref 150.0–400.0)
RBC: 4.86 Mil/uL (ref 4.22–5.81)
RDW: 14.1 % (ref 11.5–15.5)
WBC: 6 10*3/uL (ref 4.0–10.5)

## 2022-03-11 LAB — COMPREHENSIVE METABOLIC PANEL
ALT: 61 U/L — ABNORMAL HIGH (ref 0–53)
AST: 50 U/L — ABNORMAL HIGH (ref 0–37)
Albumin: 4.4 g/dL (ref 3.5–5.2)
Alkaline Phosphatase: 89 U/L (ref 39–117)
BUN: 10 mg/dL (ref 6–23)
CO2: 29 mEq/L (ref 19–32)
Calcium: 9.5 mg/dL (ref 8.4–10.5)
Chloride: 104 mEq/L (ref 96–112)
Creatinine, Ser: 0.81 mg/dL (ref 0.40–1.50)
GFR: 98.08 mL/min (ref >60.00)
Glucose, Bld: 71 mg/dL (ref 70–99)
Potassium: 4.8 mEq/L (ref 3.5–5.1)
Sodium: 140 mEq/L (ref 135–145)
Total Bilirubin: 3.6 mg/dL — ABNORMAL HIGH (ref 0.2–1.2)
Total Protein: 7.2 g/dL (ref 6.0–8.3)

## 2022-03-11 LAB — CK: Total CK: 88 U/L (ref 7–232)

## 2022-03-11 LAB — URINALYSIS
Bilirubin Urine: NEGATIVE
Hgb urine dipstick: NEGATIVE
Ketones, ur: NEGATIVE
Leukocytes,Ua: NEGATIVE
Nitrite: NEGATIVE
Specific Gravity, Urine: 1.01 (ref 1.000–1.030)
Total Protein, Urine: NEGATIVE
Urine Glucose: >=1000 — AB
Urobilinogen, UA: 0.2 (ref 0.0–1.0)
pH: 5.5 (ref 5.0–8.0)

## 2022-03-11 NOTE — Progress Notes (Signed)
Michael Gibbs is a 56 y.o. male with the following history as recorded in Epic[employer]:  Patient Active Problem List   Diagnosis Date Noted   Orthostatic dizziness 02/05/2022   Ischemic cardiomyopathy 02/05/2022   Fatigue - post STEMI 02/05/2022   HFrEF (heart failure with reduced ejection fraction) (HCC) LVEF 30-35% post STEMI 01/23/2022   STEMI involving left anterior descending coronary artery (HCC) - s/p stent to proximal LAD 01/20/2022   STEMI (ST elevation myocardial infarction) ([place]) 01/20/2022   Subluxation of peroneal tendon, right, initial encounter 06/13/2018   Nonallopathic lesion of cervical region 03/13/2018   Nonallopathic lesion of thoracic region 03/13/2018   Nonallopathic lesion of rib cage 03/13/2018   Scapular dyskinesis 02/10/2018   Neck pain on left side 12/07/2017   Low back pain 09/06/2016   HLD (hyperlipidemia) 08/11/2014   Dermatitis 09/22/2011   Preventative health care 04/02/2011   Obstructive sleep apnea - does not wear CPAP 12/09/2008   Allergic state 04/25/2007    Current Outpatient Medications  Medication Sig Dispense Refill   aspirin EC 81 MG tablet Take 1 tablet (81 mg total) by mouth daily. Swallow whole. 30 tablet 12   atorvastatin (LIPITOR) 80 MG tablet Take 1 tablet (80 mg total) by mouth daily. 90 tablet 1   carvedilol (COREG) 3.125 MG tablet Take 1 tablet (3.125 mg total) by mouth 2 (two) times daily with a meal. 180 tablet 1   dapagliflozin propanediol (FARXIGA) 10 MG TABS tablet Take 1 tablet (10 mg total) by mouth daily. 30 tablet 5   losartan (COZAAR) 25 MG tablet Take 1 tablet (25 mg total) by mouth daily. 90 tablet 3   nitroGLYCERIN (NITROSTAT) 0.4 MG SL tablet Place 1 tablet (0.4 mg total) under the tongue every 5 (five) minutes x 3 doses as needed for chest pain. 25 tablet 2   ticagrelor (BRILINTA) 90 MG TABS tablet Take 1 tablet (90 mg total) by mouth 2 (two) times daily. 60 tablet 11   No current facility-administered  medications for this visit.    Allergies: Patient has no known allergies.  Past Medical History:  Diagnosis Date   Allergic state 04/25/2007   Seasonal allergies-well controlled with Allegra and Flonase     CAD (coronary artery disease)    a. 12/2021: s/p STEMI with DES to proximal LAD   Dermatitis 09/22/2011   Headache(784.0)    Headache(784.0) 09/22/2011   HFrEF (heart failure with reduced ejection fraction) ([place])    a. EF 30-35% in 12/2021 in the setting of anterior STEMI   Hyperlipidemia, mixed 08/11/2014   Preventative health care 04/02/2011   Sleep apnea    Sleep apnea     Past Surgical History:  Procedure Laterality Date   CORONARY/GRAFT ACUTE MI REVASCULARIZATION N/A 01/20/2022   Procedure: Coronary/Graft Acute MI Revascularization;  Surgeon: Martinique, Peter M, MD;  Location: [place] [facility] [facility];  Service: Cardiovascular;  Laterality: N/A;   LEFT HEART CATH AND CORONARY ANGIOGRAPHY N/A 01/20/2022   Procedure: LEFT HEART CATH AND CORONARY ANGIOGRAPHY;  Surgeon: Martinique, Peter M, MD;  Location: [facility];  Service: Cardiovascular;  Laterality: N/A;    Family History  Problem Relation Age of Onset   Diabetes Mother        diet controlled DM   Other Father        bowel obstruction with resection, hyponatremia   Ulcers Father    Depression Brother    Cancer Paternal Grandmother        esophageal  cancer   GER disease Paternal Grandmother     Social History   Tobacco Use   Smoking status: Never   Smokeless tobacco: Never  Substance Use Topics   Alcohol use: Yes    Comment: rare beer    Subjective:  Accompanied by wife; Had abdominal U/S this morning to follow up on elevated bilirubin level; Also needs to get U/A repeated for his PCP to evaluate glucose noted in urine- is on [place];  Has been having increased muscle aches especially on left side of body- no swelling- "just sore."  Also has been feeling more tired/  light- headed when changing positions;  Had  MI in July 2023- prior to MI did not take any medications;     Objective:  Vitals:   03/11/22 0941  BP: 90/60  Pulse: 63  Temp: 97.9 F (36.6 C)  TempSrc: Oral  SpO2: 98%  Weight: 160 lb (72.6 kg)  Height: _0  (1.753 m)    General: Well developed, well nourished, in no acute distress  Skin : Warm and dry.  Head: Normocephalic and atraumatic  Eyes: Sclera and conjunctiva clear; pupils round and reactive to light; extraocular movements intact  Ears: External normal; canals clear; tympanic membranes normal  Oropharynx: Pink, supple. No suspicious lesions  Neck: Supple without thyromegaly, adenopathy  Lungs: Respirations unlabored; clear to auscultation bilaterally without wheeze, rales, rhonchi  CVS exam: normal rate and regular rhythm.  Musculoskeletal: No deformities; no active joint inflammation  Extremities: No edema, cyanosis, clubbing  Vessels: Symmetric bilaterally  Neurologic: Alert and oriented; speech intact; face symmetrical; moves all extremities well; CNII-XII intact without focal deficit   Assessment:  1. Myalgia   2. Abnormal laboratory test result   3. Need for immunization against influenza   4. Elevated bilirubin   5. Orthostatic hypotension     Plan:  Concern for reaction to statin; check CK level today; encouraged patient to discuss with cardiologist- understandably, patient does not want to stop statin since MI was so recent; Check U/A- suspect glucose+ is secondary to use of [place]; Flu shot updated; He had U/S this morning and understands that his PCP will be in touch; Concern for hypotension- patient wants to talk to his cardiologist before discontinuing medications;   Time spent 30+ minutes reviewing medications/ discussing side effects  No follow-ups on file.  Orders Placed This Encounter  Procedures   Flu Vaccine QUAD 70moIM (Fluarix, Fluzone & Alfiuria Quad PF)   CBC with Differential/Platelet   Comp Met (CMET)   CK (Creatine Kinase)    Urinalysis    Requested Prescriptions    No prescriptions requested or ordered in this encounter

## 2022-03-11 NOTE — Patient Instructions (Addendum)
Please talk to Dr. Swaziland with concerns:  1) Regarding myalgias on the Atorvastatin; CK pending- look at your results; 2) Concern for orthostatic hypotension-dizzy when standing/ ? Cut back on Farxiga;  3) Lost 12 pounds since MI in July 2023; - ? Related to Farxiga 4) Ask Dr. Swaziland about cardiac rehab

## 2022-03-12 ENCOUNTER — Other Ambulatory Visit: Payer: Self-pay

## 2022-03-12 ENCOUNTER — Telehealth: Payer: Self-pay | Admitting: Cardiology

## 2022-03-12 DIAGNOSIS — I25118 Atherosclerotic heart disease of native coronary artery with other forms of angina pectoris: Secondary | ICD-10-CM

## 2022-03-12 DIAGNOSIS — R17 Unspecified jaundice: Secondary | ICD-10-CM

## 2022-03-12 LAB — FECAL OCCULT BLOOD, IMMUNOCHEMICAL: Fecal Occult Bld: NEGATIVE

## 2022-03-12 NOTE — Telephone Encounter (Signed)
Called patient wife. Spoke with her about concerns of low BP.   Patient BP yesterday was 87/68 and has been running 90/70.   Patient seen PCP yesterday (note in epic) they are requested we review medications and see about medications changes.   Will route to MD to review  See note from Dr.Murray: Please talk to Dr. Swaziland with concerns:   1) Regarding myalgias on the Atorvastatin; CK pending- look at your results; 2) Concern for orthostatic hypotension-dizzy when standing/ ? Cut back on Farxiga;  3) Lost 12 pounds since MI in July 2023; - ? Related to Farxiga 4) Ask Dr. Swaziland about cardiac rehab

## 2022-03-12 NOTE — Telephone Encounter (Signed)
Called patient, advised of message below.  Patient wife requested message be sent via mychart.   Lab work ordered.

## 2022-03-12 NOTE — Telephone Encounter (Signed)
Pt c/o BP issue: STAT if pt c/o blurred vision, one-sided weakness or slurred speech  1. What are your last 5 BP readings?   83/67 90/88  2. Are you having any other symptoms (ex. Dizziness, headache, blurred vision, passed out)?   Dizziness when he suddenly stands up  3. What is your BP issue?   Wife stated patient has been having low blood pressure reading and patient had a visit with his regular doctor and had an ultrasound which was normal and they think the patient's medications may be contributed to his low BP.  Wife wants to know if he should continue with current dosage of dapagliflozin propanediol (FARXIGA) 10 MG TABS tablet and atorvastatin (LIPITOR) 80 MG tablet or if the dosage should be reduced.  Wife stated patient's bilirubin has been high - 3.2h and sugar in urine is more than 1000.

## 2022-03-17 NOTE — Telephone Encounter (Signed)
Called and spoke with pt he states that he will get paperwork and drop it off

## 2022-03-22 ENCOUNTER — Telehealth: Payer: Self-pay | Admitting: Cardiology

## 2022-03-22 ENCOUNTER — Encounter: Payer: Self-pay | Admitting: Family

## 2022-03-22 NOTE — Telephone Encounter (Signed)
Attempted to contact patient to notify of message below. Left message with the information below.   Advised to call back with questions/concerns.

## 2022-03-22 NOTE — Telephone Encounter (Signed)
Pt calling to f/u on paperwork that he sent via [place]. He would like to know when paperwork will be completed and if it can be sent back via MyChart or does he have to come in to pick up. Please advise

## 2022-03-23 ENCOUNTER — Encounter: Payer: Self-pay | Admitting: Cardiology

## 2022-03-23 ENCOUNTER — Telehealth (HOSPITAL_COMMUNITY): Payer: Self-pay

## 2022-03-23 NOTE — Telephone Encounter (Signed)
Called pt regarding below [employer]. Pt stated last week and yesterday he experienced chest discomfort after morning walk that both times was relieved with Nitro. Pt denies any other symptom or pain currently    Hi Dr Martinique,        I have this heart pain come in and go occasionally. It had happened a week or so back and I had taken one NitroGlycerin  tablet and the pain went away.  The pain is not  severe & it is a throbbing pain.   I had the same incident occur yesterday and finally did take a tablet and the pain did go away. Yesterday, the pain was there in and out at intervals from morning until I took a tablet at night.   Is this something that needs a check up  or considered  normal? Wanted to make sure I bring this to your attention.     Have a great day!   Regards   Michael Gibbs  Nurse scheduled appointment for tomorrow 9/27 with Sheela Stack, NP for further evaluations. Pt advised to report to ER of symptoms reoccur. Pt verbalized understanding

## 2022-03-24 ENCOUNTER — Encounter: Payer: Self-pay | Admitting: General Practice

## 2022-03-24 ENCOUNTER — Ambulatory Visit: Payer: No Typology Code available for payment source | Attending: General Practice | Admitting: General Practice

## 2022-03-24 VITALS — BP 102/68 | HR 71 | Ht 69.0 in | Wt 158.8 lb

## 2022-03-24 DIAGNOSIS — Z955 Presence of coronary angioplasty implant and graft: Secondary | ICD-10-CM

## 2022-03-24 DIAGNOSIS — E785 Hyperlipidemia, unspecified: Secondary | ICD-10-CM

## 2022-03-24 DIAGNOSIS — I25118 Atherosclerotic heart disease of native coronary artery with other forms of angina pectoris: Secondary | ICD-10-CM | POA: Diagnosis not present

## 2022-03-24 DIAGNOSIS — I255 Ischemic cardiomyopathy: Secondary | ICD-10-CM | POA: Diagnosis not present

## 2022-03-24 NOTE — Progress Notes (Signed)
Cardiology Clinic Note   Patient Name: Michael Gibbs Date of Encounter: 03/24/2022  Primary Care Provider:  Mosie Lukes, MD Primary Cardiologist:  Peter Martinique, MD  Patient Profile    Michael Gibbs 56 year old male presents to the clinic today for f evaluation of his chest discomfort.  Past Medical History    Past Medical History:  Diagnosis Date   Allergic state 04/25/2007   Seasonal allergies-well controlled with Allegra and Flonase     CAD (coronary artery disease)    a. 12/2021: s/p STEMI with DES to proximal LAD   Dermatitis 09/22/2011   Headache(784.0)    Headache(784.0) 09/22/2011   HFrEF (heart failure with reduced ejection fraction) ([place])    a. EF 30-35% in 12/2021 in the setting of anterior STEMI   Hyperlipidemia, mixed 08/11/2014   Preventative health care 04/02/2011   Sleep apnea    Sleep apnea    Past Surgical History:  Procedure Laterality Date   CORONARY/GRAFT ACUTE MI REVASCULARIZATION N/A 01/20/2022   Procedure: Coronary/Graft Acute MI Revascularization;  Surgeon: Martinique, Peter M, MD;  Location: [facility];  Service: Cardiovascular;  Laterality: N/A;   LEFT HEART CATH AND CORONARY ANGIOGRAPHY N/A 01/20/2022   Procedure: LEFT HEART CATH AND CORONARY ANGIOGRAPHY;  Surgeon: Martinique, Peter M, MD;  Location: [facility];  Service: Cardiovascular;  Laterality: N/A;    Allergies  No Known Allergies  History of Present Illness    Michael Gibbs has a PMH of ischemic cardiomyopathy, HFrEF, STEMI, dermatitis, hyperlipidemia, and fatigue.  He had a STEMI 03/23/2022 and underwent cardiac catheterization with PCI and DES to his proximal LAD.  His EF was noted to be 30-35%.  His PMH also includes OSA.  He was admitted 01/20/2022 until 01/23/2022.  He presented to the emergency department with chest discomfort.  His high-sensitivity troponins peaked at greater than 24,000.  He underwent emergent left heart cath which  showed single-vessel coronary artery disease involving his proximal LAD.  His postprocedure echocardiogram showed an LVEF of 30-35% with akinesis of the apical anterior, anterior septal, and inferior septal walls.  He was placed on dual antiplatelet therapy (aspirin, Brilinta as well as beta-blocker therapy and high intensity statin.  He was also noted to have NSVT on telemetry.  His GDMT was limited by soft blood pressure.  He was not started on ACE ARB or Arni or MRA.  He was prescribed Farxiga.  He contacted cardiology clinic via [facility] message 03/23/2022 and reported that last week and 03/22/2022 he had episodes of chest discomfort after his morning walk.  After both episodes he took sublingual nitroglycerin which relieved his discomfort.  He denied current chest discomfort.  He presents in clinic today for evaluation and states he has had increased stress at work.  He notices a pattern related to his increased stress and chest discomfort.  He did need to take a sublingual nitroglycerin last night which relieved his chest discomfort.  He reports compliance with his Brilinta and aspirin.  He has been walking around 90 minutes daily and continues his vegetarian diet.  We reviewed his elevated liver enzymes and urine glucose.  His atorvastatin and carvedilol were placed on hold due to his low blood pressure and his transaminitis.  We will give him the mindfulness stress reduction sheet, have him follow-up with lipid clinic, repeat echocardiogram as scheduled and follow-up with Dr. Martinique as scheduled.    Today he denies chest pain, shortness of breath, lower  extremity edema, fatigue, palpitations, melena, hematuria, hemoptysis, diaphoresis, weakness, presyncope, syncope, orthopnea, and PND.   Home Medications    Prior to Admission medications   Medication Sig Start Date End Date Taking? Authorizing Provider  aspirin EC 81 MG tablet Take 1 tablet (81 mg total) by mouth daily. Swallow whole. 01/24/22    Strader, Lennart Pall, PA-C  atorvastatin (LIPITOR) 80 MG tablet Take 1 tablet (80 mg total) by mouth daily. 01/24/22   Strader, Lennart Pall, PA-C  carvedilol (COREG) 3.125 MG tablet Take 3.125 mg by mouth 2 (two) times daily with a meal.    [provider]  dapagliflozin propanediol (FARXIGA) 10 MG TABS tablet Take 1 tablet (10 mg total) by mouth daily. 01/24/22   Strader, Lennart Pall, PA-C  losartan (COZAAR) 25 MG tablet Take 1 tablet (25 mg total) by mouth daily. Patient taking differently: Take 25 mg by mouth every evening. 02/11/22   Swaziland, Peter M, MD  nitroGLYCERIN (NITROSTAT) 0.4 MG SL tablet Place 1 tablet (0.4 mg total) under the tongue every 5 (five) minutes x 3 doses as needed for chest pain. 01/23/22   Strader, Lennart Pall, PA-C  ticagrelor (BRILINTA) 90 MG TABS tablet Take 1 tablet (90 mg total) by mouth 2 (two) times daily. 01/23/22   Ellsworth Lennox, PA-C    Family History    Family History  Problem Relation Age of Onset   Diabetes Mother        diet controlled DM   Other Father        bowel obstruction with resection, hyponatremia   Ulcers Father    Depression Brother    Cancer Paternal Grandmother        esophageal cancer   GER disease Paternal Grandmother    He indicated that his mother is alive. He indicated that his father is alive. He indicated that both of his sisters are alive. He indicated that all of his five brothers are alive. He indicated that his maternal grandmother is alive. He indicated that his maternal grandfather is deceased. He indicated that his paternal grandmother is deceased. He indicated that his paternal grandfather is deceased. He indicated that his daughter is alive. He indicated that his son is alive.  Social History    Social History   Socioeconomic History   Marital status: Married    Spouse name: Not on file   Number of children: Not on file   Years of education: Not on file   Highest education level: Not on file  Occupational  History   Not on file  Tobacco Use   Smoking status: Never   Smokeless tobacco: Never  Substance and Sexual Activity   Alcohol use: Yes    Comment: rare beer   Drug use: No   Sexual activity: Yes    Comment: lives with wife,  works in IT, vegetarian, wears a seat baelt  Other Topics Concern   Not on file  Social History Narrative   Patient is from [place] but lives in [place] 2002, has been in the [place] since 1996, lives with wife and 2 children-ages 9 and 7, wife works from home, her office is in [place]. Remainder family lives in [place]. Patient works in Consulting civil engineer. Enjoys tennis. Vegetarian.   Social Determinants of Health   Financial Resource Strain: Not on file  Food Insecurity: Not on file  Transportation Needs: Not on file  Physical Activity: Not on file  Stress: Not on file  Social Connections: Not on file  Intimate Partner Violence: Not on file     Review of Systems    General:  No chills, fever, night sweats or weight changes.  Cardiovascular:  No chest pain, dyspnea on exertion, edema, orthopnea, palpitations, paroxysmal nocturnal dyspnea. Dermatological: No rash, lesions/masses Respiratory: No cough, dyspnea Urologic: No hematuria, dysuria Abdominal:   No nausea, vomiting, diarrhea, bright red blood per rectum, melena, or hematemesis Neurologic:  No visual changes, wkns, changes in mental status. All other systems reviewed and are otherwise negative except as noted above.  Physical Exam    VS:  BP 102/68   Pulse 71   Ht 5\' 9"  (1.753 m)   Wt 158 lb 12.8 oz (72 kg)   SpO2 99%   BMI 23.45 kg/m  , BMI Body mass index is 23.45 kg/m. GEN: Well nourished, well developed, in no acute distress. HEENT: normal. Neck: Supple, no JVD, carotid bruits, or masses. Cardiac: RRR, no murmurs, rubs, or gallops. No clubbing, cyanosis, edema.  Radials/DP/PT 2+ and equal bilaterally.  Respiratory:  Respirations regular and unlabored, clear to auscultation bilaterally. GI:  Soft, nontender, nondistended, BS + x 4. MS: no deformity or atrophy. Skin: warm and dry, no rash. Neuro:  Strength and sensation are intact. Psych: Normal affect.  Accessory Clinical Findings    Recent Labs: 01/21/2022: TSH 1.993 01/22/2022: B Natriuretic Peptide 225.6; Magnesium 2.0 03/11/2022: ALT 61; BUN 10; Creatinine, Ser 0.81; Hemoglobin 13.8; Platelets 210.0; Potassium 4.8; Sodium 140   Recent Lipid Panel    Component Value Date/Time   CHOL 99 (L) 02/22/2022 0931   TRIG 82 02/22/2022 0931   HDL 33 (L) 02/22/2022 0931   CHOLHDL 3.0 02/22/2022 0931   CHOLHDL 4.7 01/20/2022 2245   VLDL 27 01/20/2022 2245   LDLCALC 49 02/22/2022 0931   LDLCALC 132 (H) 04/15/2020 1155   LDLDIRECT 110 (H) 04/25/2007 1845         ECG personally reviewed by me today-normal sinus rhythm rightward axis deviation anterior septal infarct undetermined age 62 bpm- No acute changes  Echocardiogram 01/21/2022 IMPRESSIONS     1. Left ventricular ejection fraction, by estimation, is 30 to 35%. The  left ventricle has moderately decreased function. The left ventricle  demonstrates regional wall motion abnormalities with mid to apical  anterior, anteroseptal, and inferoseptal  akinesis. There is akinesis of the true apex, the apical inferior wall,  and the apical lateral wall. This is suggestive of LAD territory MI. There  is mild left ventricular hypertrophy. Left ventricular diastolic  parameters are indeterminate.   2. Right ventricular systolic function is normal. The right ventricular  size is normal. Tricuspid regurgitation signal is inadequate for assessing  PA pressure.   3. The mitral valve is normal in structure. Mild mitral valve  regurgitation. No evidence of mitral stenosis.   4. The aortic valve is tricuspid. There is mild calcification of the  aortic valve. Aortic valve regurgitation is not visualized. No aortic  stenosis is present.   5. The inferior vena cava is normal in size  with greater than 50%  respiratory variability, suggesting right atrial pressure of 3 mmHg.  Cardiac catheterization 01/20/2029   Prox LAD lesion is 100% stenosed.   Mid Cx to Dist Cx lesion is 20% stenosed with 20% stenosed side branch in LPAV.   A drug-eluting stent was successfully placed using a SYNERGY XD [id number].   Post intervention, there is a 0% residual stenosis.   LV end diastolic pressure is moderately elevated.   Single vessel  occlusive CAD involving the proximal LAD Moderately elevated LVEDP 30 mm Hg Successful PCI of the proximal LAD with DES x 1.   Plan: DAPT for one year.   Diagnostic Dominance: Left  Intervention    Assessment & Plan   1.  Chest pain-no chest pain today.  Has had 2 episodes over the last 2 weeks of chest discomfort that were nitro responsive.  Underwent LHC with PCI and DES 01/20/2022.  His proximal LAD was stented.  He was noted to have single-vessel CAD.  Recently seen by PCP 9/23 with back and right shoulder pain.  It was felt to be possibly related to gallbladder, labs and abdominal ultrasound were negative.  Current discomfort appears to be related to precordial versus stress Continue aspirin, Brilinta, Farxiga, nitroglycerin as needed Heart healthy low-sodium diet Increase physical activity as tolerated Mindfulness stress reduction  Ischemic cardiomyopathy, chronic systolic CHF-no increased DOE or activity intolerance.  Continues to walk daily.  Euvolemic.  Weight stable.  GDMT previously limited due to soft blood pressure.  Carvedilol stopped due to blood pressures 80s over 60s Continue Farxiga Heart healthy low-sodium diet Increase physical activity as tolerated Repeat echocardiogram as scheduled  Hyperlipidemia- 01/20/2022: VLDL 27 02/22/2022: Cholesterol, Total 99; HDL 33; LDL Chol Calc (NIH) 49; Triglycerides 82.  Atorvastatin held due to elevated transaminases continue  aspirin Heart healthy low-sodium high-fiber diet Follow-up with  lipid clinic  Disposition: Follow-up with Dr. Martinique as scheduled   Jossie Ng. Camala Talwar NP-C     03/24/2022, 2:55 PM [address] [place] [address] Office [phone] Fax [phone]  Notice: This dictation was prepared with Dragon dictation along with smaller phrase technology. Any transcriptional errors that result from this process are unintentional and may not be corrected upon review.  I spent 15 minutes examining this patient, reviewing medications, and using patient centered shared decision making involving her cardiac care.  Prior to her visit I spent greater than 20 minutes reviewing her past medical history,  medications, and prior cardiac tests.

## 2022-03-24 NOTE — Patient Instructions (Signed)
Medication Instructions:  The current medical regimen is effective;  continue present plan and medications as directed. Please refer to the Current Medication list given to you today.  *If you need a refill on your cardiac medications before your next appointment, please call your pharmacy*   Lab Work: NONE  Testing/Procedures: KEEP ALL TESTING APPOINTMENTS  Other Instructions READ AND FOLLOW STRESS REDUCTION TIPS-ATTACHED  MAINTAIN PHYSICAL ACTIVITY  CONTINUE CURRENT DIET  Follow-Up: At [facility], you and your health needs are our priority.  As part of our continuing mission to provide you with exceptional heart care, we have created designated Provider Care Teams.  These Care Teams include your primary Cardiologist (physician) and Advanced Practice Providers (APPs -  Physician Assistants and Nurse Practitioners) who all work together to provide you with the care you need, when you need it.  We recommend signing up for the patient portal called "MyChart".  Sign up information is provided on this After Visit Summary.  MyChart is used to connect with patients for Virtual Visits (Telemedicine).  Patients are able to view lab/test results, encounter notes, upcoming appointments, etc.  Non-urgent messages can be sent to your provider as well.   To learn more about what you can do with MyChart, go to [email or web address].    Your next appointment:   KEEP SCHEDULED APPOINTMENT   The format for your next appointment:   In Person  Provider:   Peter Swaziland, MD     PLEASE MAKE FOLLOW UP APPOINTMENT WITH [facility]  Important Information About Sugar       Mindfulness-Based Stress Reduction Mindfulness-based stress reduction (MBSR) is a program that helps people learn to practice mindfulness. Mindfulness is the practice of consciously paying attention to the present moment. MBSR focuses on developing self-awareness, which lets you respond to life stress without  judgment or negative feelings. It can be learned and practiced through techniques such as education, breathing exercises, meditation, and yoga. MBSR includes several mindfulness techniques in one program. MBSR works best when you understand the treatment, are willing to try new things, and can commit to spending time practicing what you learn. MBSR training may include learning about: How your feelings, thoughts, and reactions affect your body. New ways to respond to things that cause negative thoughts to start (triggers). How to notice your thoughts and let go of them. Practicing awareness of everyday things that you normally do without thinking. The techniques and goals of different types of meditation. What are the benefits of MBSR? MBSR can have many benefits, which include helping you to: Develop self-awareness. This means knowing and understanding yourself. Learn skills and attitudes that help you to take part in your own health care. Learn new ways to care for yourself. Be more accepting about how things are, and let things go. Be less judgmental and approach things with an open mind. Be patient with yourself and trust yourself more. MBSR has also been shown to: Reduce negative emotions, such as sadness, overwhelm, and worry. Improve memory and focus. Change how you sense and react to pain. Boost your body's ability to fight infections. Help you connect better with other people. Improve your sense of well-being. How to practice mindfulness To do a basic awareness exercise: Find a comfortable place to sit. Pay attention to the present moment. Notice your thoughts, feelings, and surroundings just as they are. Avoid judging yourself, your feelings, or your surroundings. Make note of any judgment that comes up and let it go. Your mind  may wander, and that is okay. Make note of when your thoughts drift, and return your attention to the present moment. To do basic mindfulness  meditation: Find a comfortable place to sit. This may include a stable chair or a firm floor cushion. Sit upright with your back straight. Let your arms fall next to your sides, with your hands resting on your legs. If you are sitting in a chair, rest your feet flat on the floor. If you are sitting on a cushion, cross your legs in front of you. Keep your head in a neutral position with your chin dropped slightly. Relax your jaw and rest the tip of your tongue on the roof of your mouth. Drop your gaze to the floor or close your eyes. Breathe normally and pay attention to your breath. Feel the air moving in and out of your nose. Feel your belly expanding and relaxing with each breath. Your mind may wander, and that is okay. Make note of when your thoughts drift, and return your attention to your breath. Avoid judging yourself, your feelings, or your surroundings. Make note of any judgment or feelings that come up, let them go, and bring your attention back to your breath. When you are ready, lift your gaze or open your eyes. Pay attention to how your body feels after the meditation. Follow these instructions at home:  Find a local in-person or online MBSR program. Set aside some time regularly for mindfulness practice. Practice every day if you can. Even 10 minutes of practice is helpful. Find a mindfulness practice that works best for you. This may include one or more of the following: Meditation. This involves focusing your mind on a certain thought or activity. Breathing awareness exercises. These help you to stay present by focusing on your breath. Body scan. For this practice, you lie down and pay attention to each part of your body from head to toe. You can identify tension and soreness and consciously relax parts of your body. Yoga. Yoga involves stretching and breathing, and it can improve your ability to move and be flexible. It can also help you to test your body's limits, which can help  you release stress. Mindful eating. This way of eating involves focusing on the taste, texture, color, and smell of each bite of food. This slows down eating and helps you feel full sooner. For this reason, it can be an important part of a weight loss plan. Find a podcast or recording that provides guidance for breathing awareness, body scan, or meditation exercises. You can listen to these any time when you have a free moment to rest without distractions. Follow your treatment plan as told by your health care provider. This may include taking regular medicines and making changes to your diet or lifestyle as recommended. Where to find more information You can find more information about MBSR from: Your health care provider. Community-based meditation centers or programs. Programs offered near you. Summary Mindfulness-based stress reduction (MBSR) is a program that teaches you how to consciously pay attention to the present moment. It is used to help you deal better with daily stress, feelings, and pain. MBSR focuses on developing self-awareness, which allows you to respond to life stress without judgment or negative feelings. MBSR programs may involve learning different mindfulness practices, such as breathing exercises, meditation, yoga, body scan, or mindful eating. Find a mindfulness practice that works best for you, and set aside time for it on a regular basis. This information is  not intended to replace advice given to you by your health care provider. Make sure you discuss any questions you have with your health care provider. Document Revised: 01/22/2021 Document Reviewed: 01/22/2021 Elsevier Patient Education  2023 [employer].

## 2022-03-25 ENCOUNTER — Encounter (HOSPITAL_COMMUNITY): Payer: Self-pay

## 2022-03-25 ENCOUNTER — Encounter (HOSPITAL_COMMUNITY)
Admission: RE | Admit: 2022-03-25 | Discharge: 2022-03-25 | Disposition: A | Discharge status: Home / Self Care | Payer: No Typology Code available for payment source | Source: Ambulatory Visit | Attending: Cardiology | Admitting: Cardiology

## 2022-03-25 VITALS — BP 98/60 | HR 72 | Ht 68.75 in | Wt 157.4 lb

## 2022-03-25 DIAGNOSIS — Z955 Presence of coronary angioplasty implant and graft: Secondary | ICD-10-CM | POA: Insufficient documentation

## 2022-03-25 DIAGNOSIS — I2102 ST elevation (STEMI) myocardial infarction involving left anterior descending coronary artery: Secondary | ICD-10-CM | POA: Diagnosis present

## 2022-03-25 NOTE — Progress Notes (Addendum)
Cardiac Individual Treatment Plan  Patient Details  Name: Michael Gibbs MRN: [name] Date of Birth: 24-Oct-1965 Referring Provider:   Flowsheet Row INTENSIVE CARDIAC REHAB ORIENT from 03/25/2022 in [place]  Referring Provider Dr. Peter Martinique MD       Initial Encounter Date:  [place] from 03/25/2022 in [employer]  Date 03/25/22       Visit Diagnosis: 01/20/22 STEMI  01/20/22 S/P DES Prox LAD  Patient's Home Medications on Admission:  Current Outpatient Medications:    aspirin EC 81 MG tablet, Take 1 tablet (81 mg total) by mouth daily. Swallow whole., Disp: 30 tablet, Rfl: 12   dapagliflozin propanediol (FARXIGA) 10 MG TABS tablet, Take 1 tablet (10 mg total) by mouth daily., Disp: 30 tablet, Rfl: 5   losartan (COZAAR) 25 MG tablet, Take 1 tablet (25 mg total) by mouth daily. (Patient taking differently: Take 25 mg by mouth every evening.), Disp: 90 tablet, Rfl: 3   nitroGLYCERIN (NITROSTAT) 0.4 MG SL tablet, Place 1 tablet (0.4 mg total) under the tongue every 5 (five) minutes x 3 doses as needed for chest pain., Disp: 25 tablet, Rfl: 2   ticagrelor (BRILINTA) 90 MG TABS tablet, Take 1 tablet (90 mg total) by mouth 2 (two) times daily., Disp: 60 tablet, Rfl: 11  Past Medical History: Past Medical History:  Diagnosis Date   Allergic state 04/25/2007   Seasonal allergies-well controlled with Allegra and Flonase     CAD (coronary artery disease)    a. 12/2021: s/p STEMI with DES to proximal LAD   Dermatitis 09/22/2011   Headache(784.0)    Headache(784.0) 09/22/2011   HFrEF (heart failure with reduced ejection fraction) ([place])    a. EF 30-35% in 12/2021 in the setting of anterior STEMI   Hyperlipidemia, mixed 08/11/2014   Preventative health care 04/02/2011   Sleep apnea    Sleep apnea     Tobacco Use: Social History   Tobacco Use  Smoking Status Never   Smokeless Tobacco Never    Labs: Review Flowsheet  More data exists      Latest Ref Rng & Units 04/15/2020 04/16/2021 01/20/2022 02/22/2022 03/05/2022  Labs for ITP Cardiac and Pulmonary Rehab  Cholestrol 100 - 199 mg/dL 197  191  218  99  -  LDL (calc) 0 - 99 mg/dL 132  127  145  49  -  HDL-C >39 mg/dL 38  42.70  46  33  -  Trlycerides 0 - 149 mg/dL 156  105.0  134  82  -  Hemoglobin A1c 4.6 - 6.5 % - 5.4  5.2  - 5.6   TCO2 22 - 32 mmol/L - - 20  - -    Capillary Blood Glucose: No results found for: "GLUCAP"   Exercise Target Goals: Exercise Program Goal: Individual exercise prescription set using results from initial 6 min walk test and THRR while considering  patient's activity barriers and safety.   Exercise Prescription Goal: Initial exercise prescription builds to 30-45 minutes a day of aerobic activity, 2-3 days per week.  Home exercise guidelines will be given to patient during program as part of exercise prescription that the participant will acknowledge.  Activity Barriers & Risk Stratification:  Activity Barriers & Cardiac Risk Stratification - 03/25/22 1443       Activity Barriers & Cardiac Risk Stratification   Activity Barriers Back Problems;Neck/Spine Problems    Cardiac Risk Stratification High  under 5 mets on 6MWT            6 Minute Walk:  6 Minute Walk     Row Name 03/25/22 1439         6 Minute Walk   Phase Initial     Distance 1800 feet     Walk Time 6 minutes     # of Rest Breaks 0     MPH 3.41     METS 4.66     RPE 9     Perceived Dyspnea  0     VO2 Peak 16.31     Symptoms No     Resting HR 72 bpm     Resting BP 98/60     Resting Oxygen Saturation  100 %     Exercise Oxygen Saturation  during 6 min walk 100 %     Max Ex. HR 107 bpm     Max Ex. BP 110/72     2 Minute Post BP 110/68              Oxygen Initial Assessment:   Oxygen Re-Evaluation:   Oxygen Discharge (Final Oxygen Re-Evaluation):   Initial Exercise  Prescription:  Initial Exercise Prescription - 03/25/22 1400       Date of Initial Exercise RX and Referring Provider   Date 03/25/22    Referring Provider Dr. Peter Martinique MD    Expected Discharge Date 05/28/22      Bike   Level 2.5    Minutes 15    METs 2.2      NuStep   Level 3    SPM 70    Minutes 15    METs 2.2      Prescription Details   Frequency (times per week) 3    Duration Progress to 30 minutes of continuous aerobic without signs/symptoms of physical distress      Intensity   THRR 40-80% of Max Heartrate 65-131    Ratings of Perceived Exertion 11-13    Perceived Dyspnea 0-4      Progression   Progression Continue progressive overload as per policy without signs/symptoms or physical distress.      Resistance Training   Training Prescription Yes    Weight 4    Reps 10-15             Perform Capillary Blood Glucose checks as needed.  Exercise Prescription Changes:   Exercise Comments:   Exercise Goals and Review:   Exercise Goals     Row Name 03/25/22 1446             Exercise Goals   Increase Physical Activity Yes       Intervention Provide advice, education, support and counseling about physical activity/exercise needs.;Develop an individualized exercise prescription for aerobic and resistive training based on initial evaluation findings, risk stratification, comorbidities and participant's personal goals.       Expected Outcomes Short Term: Attend rehab on a regular basis to increase amount of physical activity.;Long Term: Add in home exercise to make exercise part of routine and to increase amount of physical activity.;Long Term: Exercising regularly at least 3-5 days a week.       Increase Strength and Stamina Yes       Intervention Provide advice, education, support and counseling about physical activity/exercise needs.;Develop an individualized exercise prescription for aerobic and resistive training based on initial evaluation  findings, risk stratification, comorbidities and participant's personal goals.  Expected Outcomes Short Term: Increase workloads from initial exercise prescription for resistance, speed, and METs.;Short Term: Perform resistance training exercises routinely during rehab and add in resistance training at home;Long Term: Improve cardiorespiratory fitness, muscular endurance and strength as measured by increased METs and functional capacity (6MWT)       Able to understand and use rate of perceived exertion (RPE) scale Yes       Intervention Provide education and explanation on how to use RPE scale       Expected Outcomes Short Term: Able to use RPE daily in rehab to express subjective intensity level;Long Term:  Able to use RPE to guide intensity level when exercising independently       Knowledge and understanding of Target Heart Rate Range (THRR) Yes       Intervention Provide education and explanation of THRR including how the numbers were predicted and where they are located for reference       Expected Outcomes Short Term: Able to state/look up THRR;Long Term: Able to use THRR to govern intensity when exercising independently;Short Term: Able to use daily as guideline for intensity in rehab       Understanding of Exercise Prescription Yes       Intervention Provide education, explanation, and written materials on patient's individual exercise prescription       Expected Outcomes Short Term: Able to explain program exercise prescription;Long Term: Able to explain home exercise prescription to exercise independently                Exercise Goals Re-Evaluation :   Discharge Exercise Prescription (Final Exercise Prescription Changes):   Nutrition:  Target Goals: Understanding of nutrition guidelines, daily intake of sodium 1500mg , cholesterol 200mg , calories 30% from fat and 7% or less from saturated fats, daily to have 5 or more servings of fruits and vegetables.  Biometrics:  Pre  Biometrics - 03/25/22 1438       Pre Biometrics   Waist Circumference 38.5 inches    Hip Circumference 39 inches    Waist to Hip Ratio 0.99 %    Triceps Skinfold 11 mm    % Body Fat 23.6 %    Grip Strength 12.5 kg    Flexibility 38 in    Single Leg Stand 30 seconds              Nutrition Therapy Plan and Nutrition Goals:   Nutrition Assessments:  MEDIFICTS Score Key: ?70 Need to make dietary changes  40-70 Heart Healthy Diet ? 40 Therapeutic Level Cholesterol Diet    Picture Your Plate Scores: [id number] Unhealthy dietary pattern with much room for improvement. 41-50 Dietary pattern unlikely to meet recommendations for good health and room for improvement. 51-60 More healthful dietary pattern, with some room for improvement.  >60 Healthy dietary pattern, although there may be some specific behaviors that could be improved.    Nutrition Goals Re-Evaluation:   Nutrition Goals Re-Evaluation:   Nutrition Goals Discharge (Final Nutrition Goals Re-Evaluation):   Psychosocial: Target Goals: Acknowledge presence or absence of significant depression and/or stress, maximize coping skills, provide positive support system. Participant is able to verbalize types and ability to use techniques and skills needed for reducing stress and depression.  Initial Review & Psychosocial Screening:  Initial Psych Review & Screening - 03/25/22 1342       Initial Review   Current issues with Current Stress Concerns    Source of Stress Concerns Occupation    Comments [name] says he has  a highly stressful job. [name] plans to work half days on the days he attends cardiac rehab      [place]? Yes   Cresenciano Lick has his wife and 2 children for support     Barriers   Psychosocial barriers to participate in program The patient should benefit from training in stress management and relaxation.      Screening Interventions   Interventions Encouraged to exercise     Expected Outcomes Long Term Goal: Stressors or current issues are controlled or eliminated.             Quality of Life Scores:  Quality of Life - 03/25/22 1459       Quality of Life   Select Quality of Life      Quality of Life Scores   Health/Function Pre 19.37 %    Socioeconomic Pre 22.71 %    Psych/Spiritual Pre 22.86 %    Family Pre 22.8 %    GLOBAL Pre 21.28 %            Scores of 19 and below usually indicate a poorer quality of life in these areas.  A difference of  2-3 points is a clinically meaningful difference.  A difference of 2-3 points in the total score of the Quality of Life Index has been associated with significant improvement in overall quality of life, self-image, physical symptoms, and general health in studies assessing change in quality of life.  PHQ-9: Review Flowsheet       03/11/2022 04/16/2021 04/15/2020 12/06/2017  Depression screen PHQ 2/9  Decreased Interest 0 0 1 0  Down, Depressed, Hopeless 0 0 0 0  PHQ - 2 Score 0 0 1 0  Altered sleeping - 0 - -  Tired, decreased energy - 0 - -  Change in appetite - 0 - -  Feeling bad or failure about yourself  - 0 - -  Trouble concentrating - 0 - -  Moving slowly or fidgety/restless - 0 - -  Suicidal thoughts - 0 - -  PHQ-9 Score - 0 - -   Interpretation of Total Score  Total Score Depression Severity:  1-4 = Minimal depression, 5-9 = Mild depression, 10-14 = Moderate depression, 15-19 = Moderately severe depression, 20-27 = Severe depression   Psychosocial Evaluation and Intervention:   Psychosocial Re-Evaluation:   Psychosocial Discharge (Final Psychosocial Re-Evaluation):   Vocational Rehabilitation: Provide vocational rehab assistance to qualifying candidates.   Vocational Rehab Evaluation & Intervention:  Vocational Rehab - 03/25/22 1519       Initial Vocational Rehab Evaluation & Intervention   Assessment shows need for Vocational Rehabilitation No   [name] works full time and  does not need voational rehab at this time            Education: Education Goals: Education classes will be provided on a weekly basis, covering required topics. Participant will state understanding/return demonstration of topics presented.     Core Videos: Exercise    Move It!  Clinical staff conducted group or individual video education with verbal and written material and guidebook.  Patient learns the recommended Pritikin exercise program. Exercise with the goal of living a long, healthy life. Some of the health benefits of exercise include controlled diabetes, healthier blood pressure levels, improved cholesterol levels, improved heart and lung capacity, improved sleep, and better body composition. Everyone should speak with their doctor before starting or changing an exercise routine.  Biomechanical Limitations Clinical staff  conducted group or individual video education with verbal and written material and guidebook.  Patient learns how biomechanical limitations can impact exercise and how we can mitigate and possibly overcome limitations to have an impactful and balanced exercise routine.  Body Composition Clinical staff conducted group or individual video education with verbal and written material and guidebook.  Patient learns that body composition (ratio of muscle mass to fat mass) is a key component to assessing overall fitness, rather than body weight alone. Increased fat mass, especially visceral belly fat, can put [place] at increased risk for metabolic syndrome, type 2 diabetes, heart disease, and even death. It is recommended to combine diet and exercise (cardiovascular and resistance training) to improve your body composition. Seek guidance from your physician and exercise physiologist before implementing an exercise routine.  Exercise Action Plan Clinical staff conducted group or individual video education with verbal and written material and guidebook.  Patient learns the  recommended strategies to achieve and enjoy long-term exercise adherence, including variety, self-motivation, self-efficacy, and positive decision making. Benefits of exercise include fitness, good health, weight management, more energy, better sleep, less stress, and overall well-being.  Medical   Heart Disease Risk Reduction Clinical staff conducted group or individual video education with verbal and written material and guidebook.  Patient learns our heart is our most vital organ as it circulates oxygen, nutrients, white blood cells, and hormones throughout the entire body, and carries waste away. Data supports a plant-based eating plan like the Pritikin Program for its effectiveness in slowing progression of and reversing heart disease. The video provides a number of recommendations to address heart disease.   Metabolic Syndrome and Belly Fat  Clinical staff conducted group or individual video education with verbal and written material and guidebook.  Patient learns what metabolic syndrome is, how it leads to heart disease, and how one can reverse it and keep it from coming back. You have metabolic syndrome if you have 3 of the following 5 criteria: abdominal obesity, high blood pressure, high triglycerides, low HDL cholesterol, and high blood sugar.  Hypertension and Heart Disease Clinical staff conducted group or individual video education with verbal and written material and guidebook.  Patient learns that high blood pressure, or hypertension, is very common in the [place]. Hypertension is largely due to excessive salt intake, but other important risk factors include being overweight, physical inactivity, drinking too much alcohol, smoking, and not eating enough potassium from fruits and vegetables. High blood pressure is a leading risk factor for heart attack, stroke, congestive heart failure, dementia, kidney failure, and premature death. Long-term effects of excessive salt intake include  stiffening of the arteries and thickening of heart muscle and organ damage. Recommendations include ways to reduce hypertension and the risk of heart disease.  Diseases of Our Time - Focusing on Diabetes Clinical staff conducted group or individual video education with verbal and written material and guidebook.  Patient learns why the best way to stop diseases of our time is prevention, through food and other lifestyle changes. Medicine (such as prescription pills and surgeries) is often only a Band-Aid on the problem, not a long-term solution. Most common diseases of our time include obesity, type 2 diabetes, hypertension, heart disease, and cancer. The Pritikin Program is recommended and has been proven to help reduce, reverse, and/or prevent the damaging effects of metabolic syndrome.  Nutrition   Overview of the Pritikin Eating Plan  Clinical staff conducted group or individual video education with verbal and written material  and guidebook.  Patient learns about the [place] for disease risk reduction. The [place] emphasizes a wide variety of unrefined, minimally-processed carbohydrates, like fruits, vegetables, whole grains, and legumes. Go, Caution, and Stop food choices are explained. Plant-based and lean animal proteins are emphasized. Rationale provided for low sodium intake for blood pressure control, low added sugars for blood sugar stabilization, and low added fats and oils for coronary artery disease risk reduction and weight management.  Calorie Density  Clinical staff conducted group or individual video education with verbal and written material and guidebook.  Patient learns about calorie density and how it impacts the Pritikin Eating Plan. Knowing the characteristics of the food you choose will help you decide whether those foods will lead to weight gain or weight loss, and whether you want to consume more or less of them. Weight loss is usually a side effect of  the Pritikin Eating Plan because of its focus on low calorie-dense foods.  Label Reading  Clinical staff conducted group or individual video education with verbal and written material and guidebook.  Patient learns about the Pritikin recommended label reading guidelines and corresponding recommendations regarding calorie density, added sugars, sodium content, and whole grains.  Dining Out - Part 1  Clinical staff conducted group or individual video education with verbal and written material and guidebook.  Patient learns that restaurant meals can be sabotaging because they can be so high in calories, fat, sodium, and/or sugar. Patient learns recommended strategies on how to positively address this and avoid unhealthy pitfalls.  Facts on Fats  Clinical staff conducted group or individual video education with verbal and written material and guidebook.  Patient learns that lifestyle modifications can be just as effective, if not more so, as many medications for lowering your risk of heart disease. A Pritikin lifestyle can help to reduce your risk of inflammation and atherosclerosis (cholesterol build-up, or plaque, in the artery walls). Lifestyle interventions such as dietary choices and physical activity address the cause of atherosclerosis. A review of the types of fats and their impact on blood cholesterol levels, along with dietary recommendations to reduce fat intake is also included.  Nutrition Action Plan  Clinical staff conducted group or individual video education with verbal and written material and guidebook.  Patient learns how to incorporate Pritikin recommendations into their lifestyle. Recommendations include planning and keeping personal health goals in mind as an important part of their success.  Healthy Mind-Set    Healthy Minds, Bodies, Hearts  Clinical staff conducted group or individual video education with verbal and written material and guidebook.  Patient learns how to  identify when they are stressed. Video will discuss the impact of that stress, as well as the many benefits of stress management. Patient will also be introduced to stress management techniques. The way we think, act, and feel has an impact on our hearts.  How Our Thoughts Can Heal Our Hearts  Clinical staff conducted group or individual video education with verbal and written material and guidebook.  Patient learns that negative thoughts can cause depression and anxiety. This can result in negative lifestyle behavior and serious health problems. Cognitive behavioral therapy is an effective method to help control our thoughts in order to change and improve our emotional outlook.  Additional Videos:  Exercise    Improving Performance  Clinical staff conducted group or individual video education with verbal and written material and guidebook.  Patient learns to use a non-linear approach by alternating intensity  levels and lengths of time spent exercising to help burn more calories and lose more body fat. Cardiovascular exercise helps improve heart health, metabolism, hormonal balance, blood sugar control, and recovery from fatigue. Resistance training improves strength, endurance, balance, coordination, reaction time, metabolism, and muscle mass. Flexibility exercise improves circulation, posture, and balance. Seek guidance from your physician and exercise physiologist before implementing an exercise routine and learn your capabilities and proper form for all exercise.  Introduction to Yoga  Clinical staff conducted group or individual video education with verbal and written material and guidebook.  Patient learns about yoga, a discipline of the coming together of mind, breath, and body. The benefits of yoga include improved flexibility, improved range of motion, better posture and core strength, increased lung function, weight loss, and positive self-image. Yoga's heart health benefits include lowered  blood pressure, healthier heart rate, decreased cholesterol and triglyceride levels, improved immune function, and reduced stress. Seek guidance from your physician and exercise physiologist before implementing an exercise routine and learn your capabilities and proper form for all exercise.  Medical   Aging: Enhancing Your Quality of Life  Clinical staff conducted group or individual video education with verbal and written material and guidebook.  Patient learns key strategies and recommendations to stay in good physical health and enhance quality of life, such as prevention strategies, having an advocate, securing a [place], and keeping a list of medications and system for tracking them. It also discusses how to avoid risk for bone loss.  Biology of Weight Control  Clinical staff conducted group or individual video education with verbal and written material and guidebook.  Patient learns that weight gain occurs because we consume more calories than we burn (eating more, moving less). Even if your body weight is normal, you may have higher ratios of fat compared to muscle mass. Too much body fat puts you at increased risk for cardiovascular disease, heart attack, stroke, type 2 diabetes, and obesity-related cancers. In addition to exercise, following the [place] can help reduce your risk.  Decoding Lab Results  Clinical staff conducted group or individual video education with verbal and written material and guidebook.  Patient learns that lab test reflects one measurement whose values change over time and are influenced by many factors, including medication, stress, sleep, exercise, food, hydration, pre-existing medical conditions, and more. It is recommended to use the knowledge from this video to become more involved with your lab results and evaluate your numbers to speak with your doctor.   Diseases of Our Time - Overview  Clinical staff conducted  group or individual video education with verbal and written material and guidebook.  Patient learns that according to the CDC, 50% to 70% of chronic diseases (such as obesity, type 2 diabetes, elevated lipids, hypertension, and heart disease) are avoidable through lifestyle improvements including healthier food choices, listening to satiety cues, and increased physical activity.  Sleep Disorders Clinical staff conducted group or individual video education with verbal and written material and guidebook.  Patient learns how good quality and duration of sleep are important to overall health and well-being. Patient also learns about sleep disorders and how they impact health along with recommendations to address them, including discussing with a physician.  Nutrition  Dining Out - Part 2 Clinical staff conducted group or individual video education with verbal and written material and guidebook.  Patient learns how to plan ahead and communicate in order to maximize their dining experience in  a healthy and nutritious manner. Included are recommended food choices based on the type of restaurant the patient is visiting.   Fueling a Best boy conducted group or individual video education with verbal and written material and guidebook.  There is a strong connection between our food choices and our health. Diseases like obesity and type 2 diabetes are very prevalent and are in large-part due to lifestyle choices. The Pritikin Eating Plan provides plenty of food and hunger-curbing satisfaction. It is easy to follow, affordable, and helps reduce health risks.  Menu Workshop  Clinical staff conducted group or individual video education with verbal and written material and guidebook.  Patient learns that restaurant meals can sabotage health goals because they are often packed with calories, fat, sodium, and sugar. Recommendations include strategies to plan ahead and to communicate with the  manager, chef, or server to help order a healthier meal.  Planning Your Eating Strategy  Clinical staff conducted group or individual video education with verbal and written material and guidebook.  Patient learns about the [place] and its benefit of reducing the risk of disease. The [place] does not focus on calories. Instead, it emphasizes high-quality, nutrient-rich foods. By knowing the characteristics of the foods, we choose, we can determine their calorie density and make informed decisions.  Targeting Your Nutrition Priorities  Clinical staff conducted group or individual video education with verbal and written material and guidebook.  Patient learns that lifestyle habits have a tremendous impact on disease risk and progression. This video provides eating and physical activity recommendations based on your personal health goals, such as reducing LDL cholesterol, losing weight, preventing or controlling type 2 diabetes, and reducing high blood pressure.  Vitamins and Minerals  Clinical staff conducted group or individual video education with verbal and written material and guidebook.  Patient learns different ways to obtain key vitamins and minerals, including through a recommended healthy diet. It is important to discuss all supplements you take with your doctor.   Healthy Mind-Set    Smoking Cessation  Clinical staff conducted group or individual video education with verbal and written material and guidebook.  Patient learns that cigarette smoking and tobacco addiction pose a serious health risk which affects millions of people. Stopping smoking will significantly reduce the risk of heart disease, lung disease, and many forms of cancer. Recommended strategies for quitting are covered, including working with your doctor to develop a successful plan.  Culinary   Becoming a Financial trader conducted group or individual video education with verbal and  written material and guidebook.  Patient learns that cooking at home can be healthy, cost-effective, quick, and puts them in control. Keys to cooking healthy recipes will include looking at your recipe, assessing your equipment needs, planning ahead, making it simple, choosing cost-effective seasonal ingredients, and limiting the use of added fats, salts, and sugars.  Cooking - Breakfast and Snacks  Clinical staff conducted group or individual video education with verbal and written material and guidebook.  Patient learns how important breakfast is to satiety and nutrition through the entire day. Recommendations include key foods to eat during breakfast to help stabilize blood sugar levels and to prevent overeating at meals later in the day. Planning ahead is also a key component.  Cooking - Human resources officer conducted group or individual video education with verbal and written material and guidebook.  Patient learns eating strategies to improve overall health, including an  approach to cook more at home. Recommendations include thinking of animal protein as a side on your plate rather than center stage and focusing instead on lower calorie dense options like vegetables, fruits, whole grains, and plant-based proteins, such as beans. Making sauces in large quantities to freeze for later and leaving the skin on your vegetables are also recommended to maximize your experience.  Cooking - Healthy Salads and Dressing Clinical staff conducted group or individual video education with verbal and written material and guidebook.  Patient learns that vegetables, fruits, whole grains, and legumes are the foundations of the Pritikin Eating Plan. Recommendations include how to incorporate each of these in flavorful and healthy salads, and how to create homemade salad dressings. Proper handling of ingredients is also covered. Cooking - Soups and [employer] - Soups and Desserts Clinical staff  conducted group or individual video education with verbal and written material and guidebook.  Patient learns that Pritikin soups and desserts make for easy, nutritious, and delicious snacks and meal components that are low in sodium, fat, sugar, and calorie density, while high in vitamins, minerals, and filling fiber. Recommendations include simple and healthy ideas for soups and desserts.   Overview     The Pritikin Solution Program Overview Clinical staff conducted group or individual video education with verbal and written material and guidebook.  Patient learns that the results of the Pritikin Program have been documented in more than 100 articles published in peer-reviewed journals, and the benefits include reducing risk factors for (and, in some cases, even reversing) high cholesterol, high blood pressure, type 2 diabetes, obesity, and more! An overview of the three key pillars of the Pritikin Program will be covered: eating well, doing regular exercise, and having a healthy mind-set.  WORKSHOPS  Exercise: Exercise Basics: Building Your Action Plan Clinical staff led group instruction and group discussion with PowerPoint presentation and patient guidebook. To enhance the learning environment the use of posters, models and videos may be added. At the conclusion of this workshop, patients will comprehend the difference between physical activity and exercise, as well as the benefits of incorporating both, into their routine. Patients will understand the FITT (Frequency, Intensity, Time, and Type) principle and how to use it to build an exercise action plan. In addition, safety concerns and other considerations for exercise and cardiac rehab will be addressed by the presenter. The purpose of this lesson is to promote a comprehensive and effective weekly exercise routine in order to improve patients' overall level of fitness.   Managing Heart Disease: Your Path to a Healthier Heart Clinical  staff led group instruction and group discussion with PowerPoint presentation and patient guidebook. To enhance the learning environment the use of posters, models and videos may be added.At the conclusion of this workshop, patients will understand the anatomy and physiology of the heart. Additionally, they will understand how Pritikin's three pillars impact the risk factors, the progression, and the management of heart disease.  The purpose of this lesson is to provide a high-level overview of the heart, heart disease, and how the Pritikin lifestyle positively impacts risk factors.  Exercise Biomechanics Clinical staff led group instruction and group discussion with PowerPoint presentation and patient guidebook. To enhance the learning environment the use of posters, models and videos may be added. Patients will learn how the structural parts of their bodies function and how these functions impact their daily activities, movement, and exercise. Patients will learn how to promote a neutral spine, learn how to  manage pain, and identify ways to improve their physical movement in order to promote healthy living. The purpose of this lesson is to expose patients to common physical limitations that impact physical activity. Participants will learn practical ways to adapt and manage aches and pains, and to minimize their effect on regular exercise. Patients will learn how to maintain good posture while sitting, walking, and lifting.  Balance Training and Fall Prevention  Clinical staff led group instruction and group discussion with PowerPoint presentation and patient guidebook. To enhance the learning environment the use of posters, models and videos may be added. At the conclusion of this workshop, patients will understand the importance of their sensorimotor skills (vision, proprioception, and the vestibular system) in maintaining their ability to balance as they age. Patients will apply a variety of  balancing exercises that are appropriate for their current level of function. Patients will understand the common causes for poor balance, possible solutions to these problems, and ways to modify their physical environment in order to minimize their fall risk. The purpose of this lesson is to teach patients about the importance of maintaining balance as they age and ways to minimize their risk of falling.  WORKSHOPS   Nutrition:  Fueling a Ship broker led group instruction and group discussion with PowerPoint presentation and patient guidebook. To enhance the learning environment the use of posters, models and videos may be added. Patients will review the foundational principles of the Pritikin Eating Plan and understand what constitutes a serving size in each of the food groups. Patients will also learn Pritikin-friendly foods that are better choices when away from home and review make-ahead meal and snack options. Calorie density will be reviewed and applied to three nutrition priorities: weight maintenance, weight loss, and weight gain. The purpose of this lesson is to reinforce (in a group setting) the key concepts around what patients are recommended to eat and how to apply these guidelines when away from home by planning and selecting Pritikin-friendly options. Patients will understand how calorie density may be adjusted for different weight management goals.  Mindful Eating  Clinical staff led group instruction and group discussion with PowerPoint presentation and patient guidebook. To enhance the learning environment the use of posters, models and videos may be added. Patients will briefly review the concepts of the Pritikin Eating Plan and the importance of low-calorie dense foods. The concept of mindful eating will be introduced as well as the importance of paying attention to internal hunger signals. Triggers for non-hunger eating and techniques for dealing with triggers will be  explored. The purpose of this lesson is to provide patients with the opportunity to review the basic principles of the Pritikin Eating Plan, discuss the value of eating mindfully and how to measure internal cues of hunger and fullness using the Hunger Scale. Patients will also discuss reasons for non-hunger eating and learn strategies to use for controlling emotional eating.  Targeting Your Nutrition Priorities Clinical staff led group instruction and group discussion with PowerPoint presentation and patient guidebook. To enhance the learning environment the use of posters, models and videos may be added. Patients will learn how to determine their genetic susceptibility to disease by reviewing their family history. Patients will gain insight into the importance of diet as part of an overall healthy lifestyle in mitigating the impact of genetics and other environmental insults. The purpose of this lesson is to provide patients with the opportunity to assess their personal nutrition priorities by looking at their  family history, their own health history and current risk factors. Patients will also be able to discuss ways of prioritizing and modifying the [place] for their highest risk areas  Menu  Clinical staff led group instruction and group discussion with PowerPoint presentation and patient guidebook. To enhance the learning environment the use of posters, models and videos may be added. Using menus brought in from [employer], or printed from [employer], patients will apply the [place] dining out guidelines that were presented in the [employer] video. Patients will also be able to practice these guidelines in a variety of provided scenarios. The purpose of this lesson is to provide patients with the opportunity to practice hands-on learning of the [place] with actual menus and practice scenarios.  Label Reading Clinical staff led group  instruction and group discussion with PowerPoint presentation and patient guidebook. To enhance the learning environment the use of posters, models and videos may be added. Patients will review and discuss the Pritikin label reading guidelines presented in Pritikin's Label Reading Educational series video. Using fool labels brought in from local grocery stores and markets, patients will apply the label reading guidelines and determine if the packaged food meet the Pritikin guidelines. The purpose of this lesson is to provide patients with the opportunity to review, discuss, and practice hands-on learning of the Pritikin Label Reading guidelines with actual packaged food labels. [place] Workshops are designed to teach patients ways to prepare quick, simple, and affordable recipes at home. The importance of nutrition's role in chronic disease risk reduction is reflected in its emphasis in the overall Pritikin program. By learning how to prepare essential core Pritikin Eating Plan recipes, patients will increase control over what they eat; be able to customize the flavor of foods without the use of added salt, sugar, or fat; and improve the quality of the food they consume. By learning a set of core recipes which are easily assembled, quickly prepared, and affordable, patients are more likely to prepare more healthy foods at home. These workshops focus on convenient breakfasts, simple entres, side dishes, and desserts which can be prepared with minimal effort and are consistent with nutrition recommendations for cardiovascular risk reduction. Cooking [employer] are taught by a Engineer, materials (RD) who has been trained by the [employer]. The chef or RD has a clear understanding of the importance of minimizing - if not completely eliminating - added fat, sugar, and sodium in recipes. Throughout the series of [place] Workshop sessions, patients  will learn about healthy ingredients and efficient methods of cooking to build confidence in their capability to prepare    Cooking School weekly topics:  Adding Flavor- Sodium-Free  Fast and Healthy Breakfasts  Powerhouse Plant-Based Proteins  Satisfying Salads and Dressings  Simple Sides and Sauces  International Cuisine-Spotlight on the [place] Zones  Delicious Desserts  Savory Soups  Efficiency Cooking - Meals in a Snap  Tasty Appetizers and Snacks  Comforting Weekend Breakfasts  One-Pot Wonders   Fast Evening Meals  Easy [place] (Psychosocial): New Thoughts, New Behaviors Clinical staff led group instruction and group discussion with PowerPoint presentation and patient guidebook. To enhance the learning environment the use of posters, models and videos may be added. Patients will learn and practice techniques for developing effective health and lifestyle goals. Patients will be able to effectively apply the  goal setting process learned to develop at least one new personal goal.  The purpose of this lesson is to expose patients to a new skill set of behavior modification techniques such as techniques setting SMART goals, overcoming barriers, and achieving new thoughts and new behaviors.  Managing Moods and Relationships Clinical staff led group instruction and group discussion with PowerPoint presentation and patient guidebook. To enhance the learning environment the use of posters, models and videos may be added. Patients will learn how emotional and chronic stress factors can impact their health and relationships. They will learn healthy ways to manage their moods and utilize positive coping mechanisms. In addition, ICR patients will learn ways to improve communication skills. The purpose of this lesson is to expose patients to ways of understanding how one's mood and health are intimately connected. Developing a  healthy outlook can help build positive relationships and connections with others. Patients will understand the importance of utilizing effective communication skills that include actively listening and being heard. They will learn and understand the importance of the "4 Cs" and especially Connections in fostering of a Healthy Mind-Set.  Healthy Sleep for a Healthy Heart Clinical staff led group instruction and group discussion with PowerPoint presentation and patient guidebook. To enhance the learning environment the use of posters, models and videos may be added. At the conclusion of this workshop, patients will be able to demonstrate knowledge of the importance of sleep to overall health, well-being, and quality of life. They will understand the symptoms of, and treatments for, common sleep disorders. Patients will also be able to identify daytime and nighttime behaviors which impact sleep, and they will be able to apply these tools to help manage sleep-related challenges. The purpose of this lesson is to provide patients with a general overview of sleep and outline the importance of quality sleep. Patients will learn about a few of the most common sleep disorders. Patients will also be introduced to the concept of "sleep hygiene," and discover ways to self-manage certain sleeping problems through simple daily behavior changes. Finally, the workshop will motivate patients by clarifying the links between quality sleep and their goals of heart-healthy living.   Recognizing and Reducing Stress Clinical staff led group instruction and group discussion with PowerPoint presentation and patient guidebook. To enhance the learning environment the use of posters, models and videos may be added. At the conclusion of this workshop, patients will be able to understand the types of stress reactions, differentiate between acute and chronic stress, and recognize the impact that chronic stress has on their health. They will  also be able to apply different coping mechanisms, such as reframing negative self-talk. Patients will have the opportunity to practice a variety of stress management techniques, such as deep abdominal breathing, progressive muscle relaxation, and/or guided imagery.  The purpose of this lesson is to educate patients on the role of stress in their lives and to provide healthy techniques for coping with it.  Learning Barriers/Preferences:  Learning Barriers/Preferences - 03/25/22 1500       Learning Barriers/Preferences   Learning Barriers Sight;Exercise Concerns   some dizziness, wears glasses   Learning Preferences Computer/Internet;Individual Instruction;Group Instruction;Pictoral;Written Material             Education Topics:  Knowledge Questionnaire Score:  Knowledge Questionnaire Score - 03/25/22 1501       Knowledge Questionnaire Score   Pre Score 22/24             Core Components/Risk Factors/Patient Goals at  Admission:  Personal Goals and Risk Factors at Admission - 03/25/22 1502       Core Components/Risk Factors/Patient Goals on Admission    Weight Management Weight Maintenance;Yes    Intervention Weight Management: Provide education and appropriate resources to help participant work on and attain dietary goals.;Weight Management: Develop a combined nutrition and exercise program designed to reach desired caloric intake, while maintaining appropriate intake of nutrient and fiber, sodium and fats, and appropriate energy expenditure required for the weight goal.    Expected Outcomes Weight Maintenance: Understanding of the daily nutrition guidelines, which includes 25-35% calories from fat, 7% or less cal from saturated fats, less than 200mg  cholesterol, less than 1.5gm of sodium, & 5 or more servings of fruits and vegetables daily;Understanding recommendations for meals to include 15-35% energy as protein, 25-35% energy from fat, 35-60% energy from carbohydrates, less  than 200mg  of dietary cholesterol, 20-35 gm of total fiber daily;Understanding of distribution of calorie intake throughout the day with the consumption of 4-5 meals/snacks    Lipids Yes    Intervention Provide education and support for participant on nutrition & aerobic/resistive exercise along with prescribed medications to achieve LDL 70mg , HDL >40mg .    Expected Outcomes Short Term: Participant states understanding of desired cholesterol values and is compliant with medications prescribed. Participant is following exercise prescription and nutrition guidelines.;Long Term: Cholesterol controlled with medications as prescribed, with individualized exercise RX and with personalized nutrition plan. Value goals: LDL < 70mg , HDL > 40 mg.    Stress Yes    Intervention Offer individual and/or small group education and counseling on adjustment to heart disease, stress management and health-related lifestyle change. Teach and support self-help strategies.;Refer participants experiencing significant psychosocial distress to appropriate mental health specialists for further evaluation and treatment. When possible, include family members and significant others in education/counseling sessions.    Expected Outcomes Short Term: Participant demonstrates changes in health-related behavior, relaxation and other stress management skills, ability to obtain effective social support, and compliance with psychotropic medications if prescribed.;Long Term: Emotional wellbeing is indicated by absence of clinically significant psychosocial distress or social isolation.    Personal Goal Other Yes    Personal Goal Short: get back on his feet Long: inc. strength    Intervention Will continue to monitor pt and progress workloads as tolerated without sign or symptom    Expected Outcomes Pt will achieve his goals and gain strength.             Core Components/Risk Factors/Patient Goals Review:    Core Components/Risk  Factors/Patient Goals at Discharge (Final Review):    ITP Comments:  ITP Comments     Row Name 03/25/22 1339           ITP Comments Dr Fransico Him MD, Medical Director, Introduction to Pritikin Education Program/ Intensive Cardiac Rehab. Initial Orientation Packet Reviewed with the patient                Comments: Participant attended orientation for the cardiac rehabilitation program on  03/25/2022  to perform initial intake and exercise walk test. Patient introduced to the [place] education and orientation packet was reviewed. Completed 6-minute walk test, measurements, initial ITP, and exercise prescription. Vital signs Pre walk test BP 98/60 sitting. Standing BP 98/70. Asymptomatic. Exit BP 110/68 Telemetry-normal sinus rhythm, asymptomatic.Harrell Gave RN BSN   Service time was from 1300 to [phone].

## 2022-03-25 NOTE — Progress Notes (Signed)
Cardiac Rehab Medication Review by a Nurse   Does the patient  feel that his/her medications are working for him/her?  yes  Has the patient been experiencing any side effects to the medications prescribed?  yes  Does the patient measure his/her own blood pressure or blood glucose at home?  yes   Does the patient have any problems obtaining medications due to transportation or finances?   no  Understanding of regimen: excellent Understanding of indications: excellent Potential of compliance: excellent    Nurse comments: Michael Gibbs is taking his medications as prescribed and has a good understanding of what his medications are for. [name] experienced neck, back and leg pain . Statin is currently on hold for the next two weeks. [name] checks his blood pressure daily.    Christa See [name] RN 03/25/2022 1:48 PM

## 2022-03-26 ENCOUNTER — Encounter (HOSPITAL_BASED_OUTPATIENT_CLINIC_OR_DEPARTMENT_OTHER): Payer: Self-pay

## 2022-03-29 ENCOUNTER — Ambulatory Visit (HOSPITAL_COMMUNITY): Payer: No Typology Code available for payment source | Attending: Nurse Practitioner

## 2022-03-29 DIAGNOSIS — I255 Ischemic cardiomyopathy: Secondary | ICD-10-CM | POA: Insufficient documentation

## 2022-03-29 LAB — ECHOCARDIOGRAM COMPLETE
Area-P 1/2: 3.77 cm2
S' Lateral: 2.8 cm

## 2022-03-29 MED ORDER — PERFLUTREN LIPID MICROSPHERE
3.0000 mL | INTRAVENOUS | Status: AC | PRN
  Administered 2022-03-29: 3 mL via INTRAVENOUS

## 2022-03-31 ENCOUNTER — Encounter (HOSPITAL_COMMUNITY): Payer: No Typology Code available for payment source

## 2022-03-31 ENCOUNTER — Other Ambulatory Visit (INDEPENDENT_AMBULATORY_CARE_PROVIDER_SITE_OTHER): Payer: No Typology Code available for payment source

## 2022-03-31 DIAGNOSIS — R17 Unspecified jaundice: Secondary | ICD-10-CM

## 2022-03-31 LAB — COMPREHENSIVE METABOLIC PANEL
ALT: 18 U/L (ref 0–53)
AST: 18 U/L (ref 0–37)
Albumin: 4.3 g/dL (ref 3.5–5.2)
Alkaline Phosphatase: 82 U/L (ref 39–117)
BUN: 10 mg/dL (ref 6–23)
CO2: 29 mEq/L (ref 19–32)
Calcium: 9.4 mg/dL (ref 8.4–10.5)
Chloride: 104 mEq/L (ref 96–112)
Creatinine, Ser: 0.81 mg/dL (ref 0.40–1.50)
GFR: 98.04 mL/min (ref >60.00)
Glucose, Bld: 85 mg/dL (ref 70–99)
Potassium: 4.3 mEq/L (ref 3.5–5.1)
Sodium: 139 mEq/L (ref 135–145)
Total Bilirubin: 2.6 mg/dL — ABNORMAL HIGH (ref 0.2–1.2)
Total Protein: 6.8 g/dL (ref 6.0–8.3)

## 2022-04-02 ENCOUNTER — Other Ambulatory Visit: Payer: Self-pay

## 2022-04-02 ENCOUNTER — Encounter (HOSPITAL_COMMUNITY)
Admission: RE | Admit: 2022-04-02 | Discharge: 2022-04-02 | Disposition: A | Discharge status: Home / Self Care | Payer: No Typology Code available for payment source | Source: Ambulatory Visit | Attending: Cardiology | Admitting: Cardiology

## 2022-04-02 DIAGNOSIS — I2102 ST elevation (STEMI) myocardial infarction involving left anterior descending coronary artery: Secondary | ICD-10-CM | POA: Diagnosis not present

## 2022-04-02 DIAGNOSIS — I255 Ischemic cardiomyopathy: Secondary | ICD-10-CM

## 2022-04-02 DIAGNOSIS — I25118 Atherosclerotic heart disease of native coronary artery with other forms of angina pectoris: Secondary | ICD-10-CM

## 2022-04-02 DIAGNOSIS — Z955 Presence of coronary angioplasty implant and graft: Secondary | ICD-10-CM | POA: Diagnosis not present

## 2022-04-02 MED ORDER — SPIRONOLACTONE 25 MG PO TABS: ORAL_TABLET | ORAL | 3 refills | Status: DC

## 2022-04-02 NOTE — Progress Notes (Signed)
Daily Session Note  Patient Details  Name: Michael Gibbs MRN: [id number] Date of Birth: 1965/11/03 Referring Provider:   Flowsheet Row INTENSIVE CARDIAC REHAB ORIENT from 03/25/2022 in [place]  Referring Provider Dr. Peter Martinique MD       Encounter Date: 04/02/2022  Check In:  Session Check In - 04/02/22 1403       Check-In   Supervising physician immediately available to respond to emergencies [facility] - Physician supervision    Physician(s) Dr. Radford Pax    Location [facility]    Staff Present Esmeralda Links BS, ACSM-CEP, Exercise Physiologist;Sulma Ruffino, RN, Deland Pretty, MS, ACSM-CEP, Exercise Physiologist;David Makemson, MS, ACSM-CEP, CCRP, Exercise Physiologist;Samantha [place], RD, LDN    Virtual Visit No    Medication changes reported     No    Fall or balance concerns reported    No    Tobacco Cessation No Change    Warm-up and Cool-down Performed as group-led instruction    Resistance Training Performed Yes    VAD Patient? No    PAD/SET Patient? No      Pain Assessment   Currently in Pain? No/denies    Pain Score 0-No pain    Multiple Pain Sites No             Capillary Blood Glucose: No results found for this or any previous visit (from the past 24 hour(s)).   Exercise Prescription Changes - 04/02/22 1623       Response to Exercise   Blood Pressure (Admit) 94/72    Blood Pressure (Exercise) 114/64    Blood Pressure (Exit) 110/56    Heart Rate (Admit) 77 bpm    Heart Rate (Exercise) 116 bpm    Heart Rate (Exit) 86 bpm    Rating of Perceived Exertion (Exercise) 9    Perceived Dyspnea (Exercise) 0    Symptoms 0    Comments Pt first day CRP2 program    Duration Progress to 30 minutes of  aerobic without signs/symptoms of physical distress    Intensity THRR unchanged      Progression   Progression Continue to progress workloads to maintain intensity without signs/symptoms of physical  distress.    Average METs 3.85      Resistance Training   Training Prescription Yes    Weight 4    Reps 10-15    Time 10 Minutes      Bike   Level 2.5    Minutes 15    METs 5.3      NuStep   Level 3    SPM 85    Minutes 15    METs 2.4             Social History   Tobacco Use  Smoking Status Never  Smokeless Tobacco Never    Goals Met:  Exercise tolerated well No report of concerns or symptoms today Strength training completed today  Goals Unmet:  Not Applicable  Comments: [name] started cardiac rehab today.  Pt tolerated light exercise without difficulty. VSS, telemetry-Sinus Rhythm, asymptomatic.  Medication list reconciled. Pt denies barriers to medicaiton compliance.  PSYCHOSOCIAL ASSESSMENT:  PHQ-0. Pt exhibits positive coping skills, hopeful outlook with supportive family. No psychosocial needs identified at this time, no psychosocial interventions necessary.    Pt enjoys travelling, and working on his car and reading .   Pt oriented to exercise equipment and routine.    Understanding verbalized. Barnet Pall, RN,BSN 04/02/2022 4:53 PM  Dr. Traci Turner is Medical Director for Cardiac Rehab at [facility]. 

## 2022-04-02 NOTE — Progress Notes (Signed)
Aldactone

## 2022-04-02 NOTE — Telephone Encounter (Signed)
Spoke to patient's wife Dr.Jordan's recommendation given.Stated husband is presently at cardiac rehab.Advised to start Aldactone 12.5 mg daily.Advised to monitor B/P and call back if systolic 90 or below.He will have a bmet in 1 week.Appointment with Dr. Martinique moved up to 10/25 at 4:30 pm.

## 2022-04-05 NOTE — Progress Notes (Signed)
Cardiac Individual Treatment Plan  Patient Details  Name: Michael Gibbs MRN: [id number] Date of Birth: 27-Jun-1966 Referring Provider:   Flowsheet Row INTENSIVE CARDIAC REHAB ORIENT from 03/25/2022 in [place]  Referring Provider Dr. Peter Martinique MD       Initial Encounter Date:  [place] from 03/25/2022 in [place]  Date 03/25/22       Visit Diagnosis: 01/20/22 STEMI  01/20/22 S/P DES Prox LAD  Patient's Home Medications on Admission:  Current Outpatient Medications:    aspirin EC 81 MG tablet, Take 1 tablet (81 mg total) by mouth daily. Swallow whole., Disp: 30 tablet, Rfl: 12   dapagliflozin propanediol (FARXIGA) 10 MG TABS tablet, Take 1 tablet (10 mg total) by mouth daily., Disp: 30 tablet, Rfl: 5   losartan (COZAAR) 25 MG tablet, Take 1 tablet (25 mg total) by mouth daily. (Patient taking differently: Take 25 mg by mouth every evening.), Disp: 90 tablet, Rfl: 3   nitroGLYCERIN (NITROSTAT) 0.4 MG SL tablet, Place 1 tablet (0.4 mg total) under the tongue every 5 (five) minutes x 3 doses as needed for chest pain., Disp: 25 tablet, Rfl: 2   spironolactone (ALDACTONE) 25 MG tablet, Take 1/2 tablet ( 12.5 mg ) daily, Disp: 45 tablet, Rfl: 3   ticagrelor (BRILINTA) 90 MG TABS tablet, Take 1 tablet (90 mg total) by mouth 2 (two) times daily., Disp: 60 tablet, Rfl: 11  Past Medical History: Past Medical History:  Diagnosis Date   Allergic state 04/25/2007   Seasonal allergies-well controlled with Allegra and Flonase     CAD (coronary artery disease)    a. 12/2021: s/p STEMI with DES to proximal LAD   Dermatitis 09/22/2011   Headache(784.0)    Headache(784.0) 09/22/2011   HFrEF (heart failure with reduced ejection fraction) ([place])    a. EF 30-35% in 12/2021 in the setting of anterior STEMI   Hyperlipidemia, mixed 08/11/2014   Preventative health care 04/02/2011    Sleep apnea    Sleep apnea     Tobacco Use: Social History   Tobacco Use  Smoking Status Never  Smokeless Tobacco Never    Labs: Review Flowsheet  More data exists      Latest Ref Rng & Units 04/15/2020 04/16/2021 01/20/2022 02/22/2022 03/05/2022  Labs for ITP Cardiac and Pulmonary Rehab  Cholestrol 100 - 199 mg/dL 197  191  218  99  -  LDL (calc) 0 - 99 mg/dL 132  127  145  49  -  HDL-C >39 mg/dL 38  42.70  46  33  -  Trlycerides 0 - 149 mg/dL 156  105.0  134  82  -  Hemoglobin A1c 4.6 - 6.5 % - 5.4  5.2  - 5.6   TCO2 22 - 32 mmol/L - - 20  - -    Capillary Blood Glucose: No results found for: "GLUCAP"   Exercise Target Goals: Exercise Program Goal: Individual exercise prescription set using results from initial 6 min walk test and THRR while considering  patient's activity barriers and safety.   Exercise Prescription Goal: Initial exercise prescription builds to 30-45 minutes a day of aerobic activity, 2-3 days per week.  Home exercise guidelines will be given to patient during program as part of exercise prescription that the participant will acknowledge.  Activity Barriers & Risk Stratification:  Activity Barriers & Cardiac Risk Stratification - 03/25/22 1443  Activity Barriers & Cardiac Risk Stratification   Activity Barriers Back Problems;Neck/Spine Problems    Cardiac Risk Stratification High   under 5 mets on 6MWT            6 Minute Walk:  6 Minute Walk     Row Name 03/25/22 1439         6 Minute Walk   Phase Initial     Distance 1800 feet     Walk Time 6 minutes     # of Rest Breaks 0     MPH 3.41     METS 4.66     RPE 9     Perceived Dyspnea  0     VO2 Peak 16.31     Symptoms No     Resting HR 72 bpm     Resting BP 98/60     Resting Oxygen Saturation  100 %     Exercise Oxygen Saturation  during 6 min walk 100 %     Max Ex. HR 107 bpm     Max Ex. BP 110/72     2 Minute Post BP 110/68              Oxygen Initial  Assessment:   Oxygen Re-Evaluation:   Oxygen Discharge (Final Oxygen Re-Evaluation):   Initial Exercise Prescription:  Initial Exercise Prescription - 03/25/22 1400       Date of Initial Exercise RX and Referring Provider   Date 03/25/22    Referring Provider Dr. Peter Martinique MD    Expected Discharge Date 05/28/22      Bike   Level 2.5    Minutes 15    METs 2.2      NuStep   Level 3    SPM 70    Minutes 15    METs 2.2      Prescription Details   Frequency (times per week) 3    Duration Progress to 30 minutes of continuous aerobic without signs/symptoms of physical distress      Intensity   THRR 40-80% of Max Heartrate 65-131    Ratings of Perceived Exertion 11-13    Perceived Dyspnea 0-4      Progression   Progression Continue progressive overload as per policy without signs/symptoms or physical distress.      Resistance Training   Training Prescription Yes    Weight 4    Reps 10-15             Perform Capillary Blood Glucose checks as needed.  Exercise Prescription Changes:   Exercise Prescription Changes     Row Name 04/02/22 1623             Response to Exercise   Blood Pressure (Admit) 94/72       Blood Pressure (Exercise) 114/64       Blood Pressure (Exit) 110/56       Heart Rate (Admit) 77 bpm       Heart Rate (Exercise) 116 bpm       Heart Rate (Exit) 86 bpm       Rating of Perceived Exertion (Exercise) 9       Perceived Dyspnea (Exercise) 0       Symptoms 0       Comments Pt first day CRP2 program       Duration Progress to 30 minutes of  aerobic without signs/symptoms of physical distress       Intensity THRR unchanged  Progression   Progression Continue to progress workloads to maintain intensity without signs/symptoms of physical distress.       Average METs 3.85         Resistance Training   Training Prescription Yes       Weight 4       Reps 10-15       Time 10 Minutes         Bike   Level 2.5       Minutes  15       METs 5.3         NuStep   Level 3       SPM 85       Minutes 15       METs 2.4                Exercise Comments:   Exercise Comments     Row Name 04/02/22 1630           Exercise Comments Pt first day in the CRP2 program. Pt tolerated Exercise well with an average MET level of 3.85. Pt is learning his THRR, RPE and ExRx.                Exercise Goals and Review:   Exercise Goals     Row Name 03/25/22 1446             Exercise Goals   Increase Physical Activity Yes       Intervention Provide advice, education, support and counseling about physical activity/exercise needs.;Develop an individualized exercise prescription for aerobic and resistive training based on initial evaluation findings, risk stratification, comorbidities and participant's personal goals.       Expected Outcomes Short Term: Attend rehab on a regular basis to increase amount of physical activity.;Long Term: Add in home exercise to make exercise part of routine and to increase amount of physical activity.;Long Term: Exercising regularly at least 3-5 days a week.       Increase Strength and Stamina Yes       Intervention Provide advice, education, support and counseling about physical activity/exercise needs.;Develop an individualized exercise prescription for aerobic and resistive training based on initial evaluation findings, risk stratification, comorbidities and participant's personal goals.       Expected Outcomes Short Term: Increase workloads from initial exercise prescription for resistance, speed, and METs.;Short Term: Perform resistance training exercises routinely during rehab and add in resistance training at home;Long Term: Improve cardiorespiratory fitness, muscular endurance and strength as measured by increased METs and functional capacity (6MWT)       Able to understand and use rate of perceived exertion (RPE) scale Yes       Intervention Provide education and explanation on  how to use RPE scale       Expected Outcomes Short Term: Able to use RPE daily in rehab to express subjective intensity level;Long Term:  Able to use RPE to guide intensity level when exercising independently       Knowledge and understanding of Target Heart Rate Range (THRR) Yes       Intervention Provide education and explanation of THRR including how the numbers were predicted and where they are located for reference       Expected Outcomes Short Term: Able to state/look up THRR;Long Term: Able to use THRR to govern intensity when exercising independently;Short Term: Able to use daily as guideline for intensity in rehab       Understanding of Exercise Prescription Yes  Intervention Provide education, explanation, and written materials on patient's individual exercise prescription       Expected Outcomes Short Term: Able to explain program exercise prescription;Long Term: Able to explain home exercise prescription to exercise independently                Exercise Goals Re-Evaluation :  Exercise Goals Re-Evaluation     Row Name 04/02/22 1628             Exercise Goal Re-Evaluation   Exercise Goals Review Increase Physical Activity;Increase Strength and Stamina;Able to understand and use rate of perceived exertion (RPE) scale;Knowledge and understanding of Target Heart Rate Range (THRR);Understanding of Exercise Prescription       Comments Pt first day in the CRP2 program. Pt tolerated Exercise well with an average MET level of 3.85. Pt is learning his THRR, RPE and ExRx.       Expected Outcomes Will continue to monitor pt and progress workloads as tolerated without sign or symptom                Discharge Exercise Prescription (Final Exercise Prescription Changes):  Exercise Prescription Changes - 04/02/22 1623       Response to Exercise   Blood Pressure (Admit) 94/72    Blood Pressure (Exercise) 114/64    Blood Pressure (Exit) 110/56    Heart Rate (Admit) 77 bpm     Heart Rate (Exercise) 116 bpm    Heart Rate (Exit) 86 bpm    Rating of Perceived Exertion (Exercise) 9    Perceived Dyspnea (Exercise) 0    Symptoms 0    Comments Pt first day CRP2 program    Duration Progress to 30 minutes of  aerobic without signs/symptoms of physical distress    Intensity THRR unchanged      Progression   Progression Continue to progress workloads to maintain intensity without signs/symptoms of physical distress.    Average METs 3.85      Resistance Training   Training Prescription Yes    Weight 4    Reps 10-15    Time 10 Minutes      Bike   Level 2.5    Minutes 15    METs 5.3      NuStep   Level 3    SPM 85    Minutes 15    METs 2.4             Nutrition:  Target Goals: Understanding of nutrition guidelines, daily intake of sodium <1516m, cholesterol <2044m calories 30% from fat and 7% or less from saturated fats, daily to have 5 or more servings of fruits and vegetables.  Biometrics:  Pre Biometrics - 03/25/22 1438       Pre Biometrics   Waist Circumference 38.5 inches    Hip Circumference 39 inches    Waist to Hip Ratio 0.99 %    Triceps Skinfold 11 mm    % Body Fat 23.6 %    Grip Strength 12.5 kg    Flexibility 38 in    Single Leg Stand 30 seconds              Nutrition Therapy Plan and Nutrition Goals:  Nutrition Therapy & Goals - 04/02/22 1620       Nutrition Therapy   Diet Heart Healthy Diet      Personal Nutrition Goals   Nutrition Goal Patient to identify strategies for managing cardiovascular risk by attending the weekly Pritikin and nutrition courses  Personal Goal #2 Patient to limit sodium intake to <1532m per day    Personal Goal #3 Patient to identify and limit daily intake of saturated fat, trans fat, sodium, and refined carbohydrates    Comments Goals in action. [name] lives at home with his wife and children. He works from home. Both he and his wife do the grocery shopping and cooking. They follow a  vegetarian lifestyle; he does enjoy eggs and dairy. They have switched from peanut oil to olive oil in cooking/frying. He also reports some boredom snacking especially in the evenings. Atorvastatin was stopped due to elevated due liver enzymes.      Intervention Plan   Intervention Prescribe, educate and counsel regarding individualized specific dietary modifications aiming towards targeted core components such as weight, hypertension, lipid management, diabetes, heart failure and other comorbidities.;Nutrition handout(s) given to patient.    Expected Outcomes Short Term Goal: Understand basic principles of dietary content, such as calories, fat, sodium, cholesterol and nutrients.;Long Term Goal: Adherence to prescribed nutrition plan.             Nutrition Assessments:  MEDIFICTS Score Key: ?70 Need to make dietary changes  40-70 Heart Healthy Diet ? 40 Therapeutic Level Cholesterol Diet    Picture Your Plate Scores: <<33Unhealthy dietary pattern with much room for improvement. 41-50 Dietary pattern unlikely to meet recommendations for good health and room for improvement. 51-60 More healthful dietary pattern, with some room for improvement.  >60 Healthy dietary pattern, although there may be some specific behaviors that could be improved.    Nutrition Goals Re-Evaluation:  Nutrition Goals Re-Evaluation     ROscodaName 04/02/22 1620             Goals   Current Weight 163 lb 9.3 oz (74.2 kg)       Comment Cholesterol 99, HDL 33, A1c WNL, AST 50, ALT 61; Atorvastatin was stopped due to elevated due liver enzymes.       Expected Outcome Goals in action. [name] lives at home with his wife and children. He works from home. Both he and his wife do the grocery shopping and cooking. They follow a vegetarian lifestyle; he does enjoy eggs and dairy. They have switched from peanut oil to olive oil in cooking/frying. He also reports some boredom snacking especially in the evenings.                 Nutrition Goals Re-Evaluation:  Nutrition Goals Re-Evaluation     RSequoyahName 04/02/22 1620             Goals   Current Weight 163 lb 9.3 oz (74.2 kg)       Comment Cholesterol 99, HDL 33, A1c WNL, AST 50, ALT 61; Atorvastatin was stopped due to elevated due liver enzymes.       Expected Outcome Goals in action. [name] lives at home with his wife and children. He works from home. Both he and his wife do the grocery shopping and cooking. They follow a vegetarian lifestyle; he does enjoy eggs and dairy. They have switched from peanut oil to olive oil in cooking/frying. He also reports some boredom snacking especially in the evenings.                Nutrition Goals Discharge (Final Nutrition Goals Re-Evaluation):  Nutrition Goals Re-Evaluation - 04/02/22 1620       Goals   Current Weight 163 lb 9.3 oz (74.2 kg)    Comment Cholesterol 99,  HDL 33, A1c WNL, AST 50, ALT 61; Atorvastatin was stopped due to elevated due liver enzymes.    Expected Outcome Goals in action. [name] lives at home with his wife and children. He works from home. Both he and his wife do the grocery shopping and cooking. They follow a vegetarian lifestyle; he does enjoy eggs and dairy. They have switched from peanut oil to olive oil in cooking/frying. He also reports some boredom snacking especially in the evenings.             Psychosocial: Target Goals: Acknowledge presence or absence of significant depression and/or stress, maximize coping skills, provide positive support system. Participant is able to verbalize types and ability to use techniques and skills needed for reducing stress and depression.  Initial Review & Psychosocial Screening:  Initial Psych Review & Screening - 03/25/22 1342       Initial Review   Current issues with Current Stress Concerns    Source of Stress Concerns Occupation    Comments [name] says he has a highly stressful job. [name] plans to work half days on the days  he attends cardiac rehab      [place]? Yes   [place] has his wife and 2 children for support     Barriers   Psychosocial barriers to participate in program The patient should benefit from training in stress management and relaxation.      Screening Interventions   Interventions Encouraged to exercise    Expected Outcomes Long Term Goal: Stressors or current issues are controlled or eliminated.             Quality of Life Scores:  Quality of Life - 03/25/22 1459       Quality of Life   Select Quality of Life      Quality of Life Scores   Health/Function Pre 19.37 %    Socioeconomic Pre 22.71 %    Psych/Spiritual Pre 22.86 %    Family Pre 22.8 %    GLOBAL Pre 21.28 %            Scores of 19 and below usually indicate a poorer quality of life in these areas.  A difference of  2-3 points is a clinically meaningful difference.  A difference of 2-3 points in the total score of the Quality of Life Index has been associated with significant improvement in overall quality of life, self-image, physical symptoms, and general health in studies assessing change in quality of life.  PHQ-9: Review Flowsheet  More data may exist      04/02/2022 03/11/2022 04/16/2021 04/15/2020 12/06/2017  Depression screen PHQ 2/9  Decreased Interest 0 0 0 1 0  Down, Depressed, Hopeless 0 0 0 0 0  PHQ - 2 Score 0 0 0 1 0  Altered sleeping - - 0 - -  Tired, decreased energy - - 0 - -  Change in appetite - - 0 - -  Feeling bad or failure about yourself  - - 0 - -  Trouble concentrating - - 0 - -  Moving slowly or fidgety/restless - - 0 - -  Suicidal thoughts - - 0 - -  PHQ-9 Score - - 0 - -   Interpretation of Total Score  Total Score Depression Severity:  1-4 = Minimal depression, 5-9 = Mild depression, 10-14 = Moderate depression, 15-19 = Moderately severe depression, 20-27 = Severe depression   Psychosocial Evaluation and Intervention:   Psychosocial  Re-Evaluation:  Psychosocial Re-Evaluation     Row Name 04/02/22 1654             Psychosocial Re-Evaluation   Current issues with Current Stress Concerns       Comments [name] did not voice any incrased concerns or stressors on his first day of exercise       Expected Outcomes [name] will have drecreased or controlled stress upon completion of intensive cardiac rehab       Interventions Stress management education;Relaxation education;Encouraged to attend Cardiac Rehabilitation for the exercise       Continue Psychosocial Services  Follow up required by staff         Initial Review   Source of Stress Concerns Occupation       Comments Will continue to monitor and offer support as needed                Psychosocial Discharge (Final Psychosocial Re-Evaluation):  Psychosocial Re-Evaluation - 04/02/22 1654       Psychosocial Re-Evaluation   Current issues with Current Stress Concerns    Comments [name] did not voice any incrased concerns or stressors on his first day of exercise    Expected Outcomes [name] will have drecreased or controlled stress upon completion of intensive cardiac rehab    Interventions Stress management education;Relaxation education;Encouraged to attend Cardiac Rehabilitation for the exercise    Continue Psychosocial Services  Follow up required by staff      Initial Review   Source of Stress Concerns Occupation    Comments Will continue to monitor and offer support as needed             Vocational Rehabilitation: Provide vocational rehab assistance to qualifying candidates.   Vocational Rehab Evaluation & Intervention:  Vocational Rehab - 03/25/22 1519       Initial Vocational Rehab Evaluation & Intervention   Assessment shows need for Vocational Rehabilitation No   [name] works full time and does not need voational rehab at this time            Education: Education Goals: Education classes will be provided on a weekly basis,  covering required topics. Participant will state understanding/return demonstration of topics presented.    Education     Row Name 04/02/22 1500     Education   Cardiac Education Topics Pritikin   Environmental consultant Psychosocial   Psychosocial Workshop New Thoughts, New Behaviors   Instruction Review Code 1- Verbalizes Understanding   Class Start Time 1355   Class Stop Time 1447   Class Time Calculation (min) 52 min            Core Videos: Exercise    Move It!  Clinical staff conducted group or individual video education with verbal and written material and guidebook.  Patient learns the recommended Pritikin exercise program. Exercise with the goal of living a long, healthy life. Some of the health benefits of exercise include controlled diabetes, healthier blood pressure levels, improved cholesterol levels, improved heart and lung capacity, improved sleep, and better body composition. Everyone should speak with their doctor before starting or changing an exercise routine.  Biomechanical Limitations Clinical staff conducted group or individual video education with verbal and written material and guidebook.  Patient learns how biomechanical limitations can impact exercise and how we can mitigate and possibly overcome limitations to have an impactful and balanced exercise routine.  Body Composition Clinical  staff conducted group or individual video education with verbal and written material and guidebook.  Patient learns that body composition (ratio of muscle mass to fat mass) is a key component to assessing overall fitness, rather than body weight alone. Increased fat mass, especially visceral belly fat, can put [place] at increased risk for metabolic syndrome, type 2 diabetes, heart disease, and even death. It is recommended to combine diet and exercise (cardiovascular and resistance training) to improve your body composition. Seek  guidance from your physician and exercise physiologist before implementing an exercise routine.  Exercise Action Plan Clinical staff conducted group or individual video education with verbal and written material and guidebook.  Patient learns the recommended strategies to achieve and enjoy long-term exercise adherence, including variety, self-motivation, self-efficacy, and positive decision making. Benefits of exercise include fitness, good health, weight management, more energy, better sleep, less stress, and overall well-being.  Medical   Heart Disease Risk Reduction Clinical staff conducted group or individual video education with verbal and written material and guidebook.  Patient learns our heart is our most vital organ as it circulates oxygen, nutrients, white blood cells, and hormones throughout the entire body, and carries waste away. Data supports a plant-based eating plan like the Pritikin Program for its effectiveness in slowing progression of and reversing heart disease. The video provides a number of recommendations to address heart disease.   Metabolic Syndrome and Belly Fat  Clinical staff conducted group or individual video education with verbal and written material and guidebook.  Patient learns what metabolic syndrome is, how it leads to heart disease, and how one can reverse it and keep it from coming back. You have metabolic syndrome if you have 3 of the following 5 criteria: abdominal obesity, high blood pressure, high triglycerides, low HDL cholesterol, and high blood sugar.  Hypertension and Heart Disease Clinical staff conducted group or individual video education with verbal and written material and guidebook.  Patient learns that high blood pressure, or hypertension, is very common in the [place]. Hypertension is largely due to excessive salt intake, but other important risk factors include being overweight, physical inactivity, drinking too much alcohol, smoking, and  not eating enough potassium from fruits and vegetables. High blood pressure is a leading risk factor for heart attack, stroke, congestive heart failure, dementia, kidney failure, and premature death. Long-term effects of excessive salt intake include stiffening of the arteries and thickening of heart muscle and organ damage. Recommendations include ways to reduce hypertension and the risk of heart disease.  Diseases of Our Time - Focusing on Diabetes Clinical staff conducted group or individual video education with verbal and written material and guidebook.  Patient learns why the best way to stop diseases of our time is prevention, through food and other lifestyle changes. Medicine (such as prescription pills and surgeries) is often only a Band-Aid on the problem, not a long-term solution. Most common diseases of our time include obesity, type 2 diabetes, hypertension, heart disease, and cancer. The Pritikin Program is recommended and has been proven to help reduce, reverse, and/or prevent the damaging effects of metabolic syndrome.  Nutrition   Overview of the Pritikin Eating Plan  Clinical staff conducted group or individual video education with verbal and written material and guidebook.  Patient learns about the [place] for disease risk reduction. The [place] emphasizes a wide variety of unrefined, minimally-processed carbohydrates, like fruits, vegetables, whole grains, and legumes. Go, Caution, and Stop food choices are explained. Plant-based and  lean animal proteins are emphasized. Rationale provided for low sodium intake for blood pressure control, low added sugars for blood sugar stabilization, and low added fats and oils for coronary artery disease risk reduction and weight management.  Calorie Density  Clinical staff conducted group or individual video education with verbal and written material and guidebook.  Patient learns about calorie density and how it impacts  the Pritikin Eating Plan. Knowing the characteristics of the food you choose will help you decide whether those foods will lead to weight gain or weight loss, and whether you want to consume more or less of them. Weight loss is usually a side effect of the Pritikin Eating Plan because of its focus on low calorie-dense foods.  Label Reading  Clinical staff conducted group or individual video education with verbal and written material and guidebook.  Patient learns about the Pritikin recommended label reading guidelines and corresponding recommendations regarding calorie density, added sugars, sodium content, and whole grains.  Dining Out - Part 1  Clinical staff conducted group or individual video education with verbal and written material and guidebook.  Patient learns that restaurant meals can be sabotaging because they can be so high in calories, fat, sodium, and/or sugar. Patient learns recommended strategies on how to positively address this and avoid unhealthy pitfalls.  Facts on Fats  Clinical staff conducted group or individual video education with verbal and written material and guidebook.  Patient learns that lifestyle modifications can be just as effective, if not more so, as many medications for lowering your risk of heart disease. A Pritikin lifestyle can help to reduce your risk of inflammation and atherosclerosis (cholesterol build-up, or plaque, in the artery walls). Lifestyle interventions such as dietary choices and physical activity address the cause of atherosclerosis. A review of the types of fats and their impact on blood cholesterol levels, along with dietary recommendations to reduce fat intake is also included.  Nutrition Action Plan  Clinical staff conducted group or individual video education with verbal and written material and guidebook.  Patient learns how to incorporate Pritikin recommendations into their lifestyle. Recommendations include planning and keeping personal  health goals in mind as an important part of their success.  Healthy Mind-Set    Healthy Minds, Bodies, Hearts  Clinical staff conducted group or individual video education with verbal and written material and guidebook.  Patient learns how to identify when they are stressed. Video will discuss the impact of that stress, as well as the many benefits of stress management. Patient will also be introduced to stress management techniques. The way we think, act, and feel has an impact on our hearts.  How Our Thoughts Can Heal Our Hearts  Clinical staff conducted group or individual video education with verbal and written material and guidebook.  Patient learns that negative thoughts can cause depression and anxiety. This can result in negative lifestyle behavior and serious health problems. Cognitive behavioral therapy is an effective method to help control our thoughts in order to change and improve our emotional outlook.  Additional Videos:  Exercise    Improving Performance  Clinical staff conducted group or individual video education with verbal and written material and guidebook.  Patient learns to use a non-linear approach by alternating intensity levels and lengths of time spent exercising to help burn more calories and lose more body fat. Cardiovascular exercise helps improve heart health, metabolism, hormonal balance, blood sugar control, and recovery from fatigue. Resistance training improves strength, endurance, balance, coordination, reaction time, metabolism,  and muscle mass. Flexibility exercise improves circulation, posture, and balance. Seek guidance from your physician and exercise physiologist before implementing an exercise routine and learn your capabilities and proper form for all exercise.  Introduction to Yoga  Clinical staff conducted group or individual video education with verbal and written material and guidebook.  Patient learns about yoga, a discipline of the coming  together of mind, breath, and body. The benefits of yoga include improved flexibility, improved range of motion, better posture and core strength, increased lung function, weight loss, and positive self-image. Yoga's heart health benefits include lowered blood pressure, healthier heart rate, decreased cholesterol and triglyceride levels, improved immune function, and reduced stress. Seek guidance from your physician and exercise physiologist before implementing an exercise routine and learn your capabilities and proper form for all exercise.  Medical   Aging: Enhancing Your Quality of Life  Clinical staff conducted group or individual video education with verbal and written material and guidebook.  Patient learns key strategies and recommendations to stay in good physical health and enhance quality of life, such as prevention strategies, having an advocate, securing a [place], and keeping a list of medications and system for tracking them. It also discusses how to avoid risk for bone loss.  Biology of Weight Control  Clinical staff conducted group or individual video education with verbal and written material and guidebook.  Patient learns that weight gain occurs because we consume more calories than we burn (eating more, moving less). Even if your body weight is normal, you may have higher ratios of fat compared to muscle mass. Too much body fat puts you at increased risk for cardiovascular disease, heart attack, stroke, type 2 diabetes, and obesity-related cancers. In addition to exercise, following the [place] can help reduce your risk.  Decoding Lab Results  Clinical staff conducted group or individual video education with verbal and written material and guidebook.  Patient learns that lab test reflects one measurement whose values change over time and are influenced by many factors, including medication, stress, sleep, exercise, food, hydration,  pre-existing medical conditions, and more. It is recommended to use the knowledge from this video to become more involved with your lab results and evaluate your numbers to speak with your doctor.   Diseases of Our Time - Overview  Clinical staff conducted group or individual video education with verbal and written material and guidebook.  Patient learns that according to the CDC, 50% to 70% of chronic diseases (such as obesity, type 2 diabetes, elevated lipids, hypertension, and heart disease) are avoidable through lifestyle improvements including healthier food choices, listening to satiety cues, and increased physical activity.  Sleep Disorders Clinical staff conducted group or individual video education with verbal and written material and guidebook.  Patient learns how good quality and duration of sleep are important to overall health and well-being. Patient also learns about sleep disorders and how they impact health along with recommendations to address them, including discussing with a physician.  Nutrition  Dining Out - Part 2 Clinical staff conducted group or individual video education with verbal and written material and guidebook.  Patient learns how to plan ahead and communicate in order to maximize their dining experience in a healthy and nutritious manner. Included are recommended food choices based on the type of restaurant the patient is visiting.   Fueling a Best boy conducted group or individual video education with verbal and written material and guidebook.  There is a strong connection between our food choices and our health. Diseases like obesity and type 2 diabetes are very prevalent and are in large-part due to lifestyle choices. The Pritikin Eating Plan provides plenty of food and hunger-curbing satisfaction. It is easy to follow, affordable, and helps reduce health risks.  Menu Workshop  Clinical staff conducted group or individual video education  with verbal and written material and guidebook.  Patient learns that restaurant meals can sabotage health goals because they are often packed with calories, fat, sodium, and sugar. Recommendations include strategies to plan ahead and to communicate with the manager, chef, or server to help order a healthier meal.  Planning Your Eating Strategy  Clinical staff conducted group or individual video education with verbal and written material and guidebook.  Patient learns about the [place] and its benefit of reducing the risk of disease. The [place] does not focus on calories. Instead, it emphasizes high-quality, nutrient-rich foods. By knowing the characteristics of the foods, we choose, we can determine their calorie density and make informed decisions.  Targeting Your Nutrition Priorities  Clinical staff conducted group or individual video education with verbal and written material and guidebook.  Patient learns that lifestyle habits have a tremendous impact on disease risk and progression. This video provides eating and physical activity recommendations based on your personal health goals, such as reducing LDL cholesterol, losing weight, preventing or controlling type 2 diabetes, and reducing high blood pressure.  Vitamins and Minerals  Clinical staff conducted group or individual video education with verbal and written material and guidebook.  Patient learns different ways to obtain key vitamins and minerals, including through a recommended healthy diet. It is important to discuss all supplements you take with your doctor.   Healthy Mind-Set    Smoking Cessation  Clinical staff conducted group or individual video education with verbal and written material and guidebook.  Patient learns that cigarette smoking and tobacco addiction pose a serious health risk which affects millions of people. Stopping smoking will significantly reduce the risk of heart disease, lung disease,  and many forms of cancer. Recommended strategies for quitting are covered, including working with your doctor to develop a successful plan.  Culinary   Becoming a Financial trader conducted group or individual video education with verbal and written material and guidebook.  Patient learns that cooking at home can be healthy, cost-effective, quick, and puts them in control. Keys to cooking healthy recipes will include looking at your recipe, assessing your equipment needs, planning ahead, making it simple, choosing cost-effective seasonal ingredients, and limiting the use of added fats, salts, and sugars.  Cooking - Breakfast and Snacks  Clinical staff conducted group or individual video education with verbal and written material and guidebook.  Patient learns how important breakfast is to satiety and nutrition through the entire day. Recommendations include key foods to eat during breakfast to help stabilize blood sugar levels and to prevent overeating at meals later in the day. Planning ahead is also a key component.  Cooking - Human resources officer conducted group or individual video education with verbal and written material and guidebook.  Patient learns eating strategies to improve overall health, including an approach to cook more at home. Recommendations include thinking of animal protein as a side on your plate rather than center stage and focusing instead on lower calorie dense options like vegetables, fruits, whole grains, and plant-based proteins, such as beans. Making sauces  in large quantities to freeze for later and leaving the skin on your vegetables are also recommended to maximize your experience.  Cooking - Healthy Salads and Dressing Clinical staff conducted group or individual video education with verbal and written material and guidebook.  Patient learns that vegetables, fruits, whole grains, and legumes are the foundations of the [place].  Recommendations include how to incorporate each of these in flavorful and healthy salads, and how to create homemade salad dressings. Proper handling of ingredients is also covered. Cooking - Soups and [employer] - Soups and Desserts Clinical staff conducted group or individual video education with verbal and written material and guidebook.  Patient learns that Pritikin soups and desserts make for easy, nutritious, and delicious snacks and meal components that are low in sodium, fat, sugar, and calorie density, while high in vitamins, minerals, and filling fiber. Recommendations include simple and healthy ideas for soups and desserts.   Overview     The Pritikin Solution Program Overview Clinical staff conducted group or individual video education with verbal and written material and guidebook.  Patient learns that the results of the [place] Program have been documented in more than 100 articles published in peer-reviewed journals, and the benefits include reducing risk factors for (and, in some cases, even reversing) high cholesterol, high blood pressure, type 2 diabetes, obesity, and more! An overview of the three key pillars of the Pritikin Program will be covered: eating well, doing regular exercise, and having a healthy mind-set.  WORKSHOPS  Exercise: Exercise Basics: Building Your Action Plan Clinical staff led group instruction and group discussion with PowerPoint presentation and patient guidebook. To enhance the learning environment the use of posters, models and videos may be added. At the conclusion of this workshop, patients will comprehend the difference between physical activity and exercise, as well as the benefits of incorporating both, into their routine. Patients will understand the FITT (Frequency, Intensity, Time, and Type) principle and how to use it to build an exercise action plan. In addition, safety concerns and other considerations for exercise and cardiac rehab  will be addressed by the presenter. The purpose of this lesson is to promote a comprehensive and effective weekly exercise routine in order to improve patients' overall level of fitness.   Managing Heart Disease: Your Path to a Healthier Heart Clinical staff led group instruction and group discussion with PowerPoint presentation and patient guidebook. To enhance the learning environment the use of posters, models and videos may be added.At the conclusion of this workshop, patients will understand the anatomy and physiology of the heart. Additionally, they will understand how Pritikin's three pillars impact the risk factors, the progression, and the management of heart disease.  The purpose of this lesson is to provide a high-level overview of the heart, heart disease, and how the Pritikin lifestyle positively impacts risk factors.  Exercise Biomechanics Clinical staff led group instruction and group discussion with PowerPoint presentation and patient guidebook. To enhance the learning environment the use of posters, models and videos may be added. Patients will learn how the structural parts of their bodies function and how these functions impact their daily activities, movement, and exercise. Patients will learn how to promote a neutral spine, learn how to manage pain, and identify ways to improve their physical movement in order to promote healthy living. The purpose of this lesson is to expose patients to common physical limitations that impact physical activity. Participants will learn practical ways to adapt and manage aches  and pains, and to minimize their effect on regular exercise. Patients will learn how to maintain good posture while sitting, walking, and lifting.  Balance Training and Fall Prevention  Clinical staff led group instruction and group discussion with PowerPoint presentation and patient guidebook. To enhance the learning environment the use of posters, models and videos  may be added. At the conclusion of this workshop, patients will understand the importance of their sensorimotor skills (vision, proprioception, and the vestibular system) in maintaining their ability to balance as they age. Patients will apply a variety of balancing exercises that are appropriate for their current level of function. Patients will understand the common causes for poor balance, possible solutions to these problems, and ways to modify their physical environment in order to minimize their fall risk. The purpose of this lesson is to teach patients about the importance of maintaining balance as they age and ways to minimize their risk of falling.  WORKSHOPS   Nutrition:  Fueling a Scientist, research (physical sciences) led group instruction and group discussion with PowerPoint presentation and patient guidebook. To enhance the learning environment the use of posters, models and videos may be added. Patients will review the foundational principles of the [place] and understand what constitutes a serving size in each of the food groups. Patients will also learn Pritikin-friendly foods that are better choices when away from home and review make-ahead meal and snack options. Calorie density will be reviewed and applied to three nutrition priorities: weight maintenance, weight loss, and weight gain. The purpose of this lesson is to reinforce (in a group setting) the key concepts around what patients are recommended to eat and how to apply these guidelines when away from home by planning and selecting Pritikin-friendly options. Patients will understand how calorie density may be adjusted for different weight management goals.  Mindful Eating  Clinical staff led group instruction and group discussion with PowerPoint presentation and patient guidebook. To enhance the learning environment the use of posters, models and videos may be added. Patients will briefly review the concepts of the [place] and the importance of low-calorie dense foods. The concept of mindful eating will be introduced as well as the importance of paying attention to internal hunger signals. Triggers for non-hunger eating and techniques for dealing with triggers will be explored. The purpose of this lesson is to provide patients with the opportunity to review the basic principles of the [place], discuss the value of eating mindfully and how to measure internal cues of hunger and fullness using the Hunger Scale. Patients will also discuss reasons for non-hunger eating and learn strategies to use for controlling emotional eating.  Targeting Your Nutrition Priorities Clinical staff led group instruction and group discussion with PowerPoint presentation and patient guidebook. To enhance the learning environment the use of posters, models and videos may be added. Patients will learn how to determine their genetic susceptibility to disease by reviewing their family history. Patients will gain insight into the importance of diet as part of an overall healthy lifestyle in mitigating the impact of genetics and other environmental insults. The purpose of this lesson is to provide patients with the opportunity to assess their personal nutrition priorities by looking at their family history, their own health history and current risk factors. Patients will also be able to discuss ways of prioritizing and modifying the [place] for their highest risk areas  Menu  Clinical staff led group instruction and group discussion with  PowerPoint presentation and patient guidebook. To enhance the learning environment the use of posters, models and videos may be added. Using menus brought in from [employer], or printed from [employer], patients will apply the [place] dining out guidelines that were presented in the [employer] video. Patients will also be able to practice these guidelines  in a variety of provided scenarios. The purpose of this lesson is to provide patients with the opportunity to practice hands-on learning of the [place] with actual menus and practice scenarios.  Label Reading Clinical staff led group instruction and group discussion with PowerPoint presentation and patient guidebook. To enhance the learning environment the use of posters, models and videos may be added. Patients will review and discuss the Pritikin label reading guidelines presented in Pritikin's Label Reading Educational series video. Using fool labels brought in from local grocery stores and markets, patients will apply the label reading guidelines and determine if the packaged food meet the Pritikin guidelines. The purpose of this lesson is to provide patients with the opportunity to review, discuss, and practice hands-on learning of the Pritikin Label Reading guidelines with actual packaged food labels. [place] Workshops are designed to teach patients ways to prepare quick, simple, and affordable recipes at home. The importance of nutrition's role in chronic disease risk reduction is reflected in its emphasis in the overall Pritikin program. By learning how to prepare essential core Pritikin Eating Plan recipes, patients will increase control over what they eat; be able to customize the flavor of foods without the use of added salt, sugar, or fat; and improve the quality of the food they consume. By learning a set of core recipes which are easily assembled, quickly prepared, and affordable, patients are more likely to prepare more healthy foods at home. These workshops focus on convenient breakfasts, simple entres, side dishes, and desserts which can be prepared with minimal effort and are consistent with nutrition recommendations for cardiovascular risk reduction. Cooking [employer] are taught by a Engineer, materials (RD) who has  been trained by the [employer]. The chef or RD has a clear understanding of the importance of minimizing - if not completely eliminating - added fat, sugar, and sodium in recipes. Throughout the series of [place] Workshop sessions, patients will learn about healthy ingredients and efficient methods of cooking to build confidence in their capability to prepare    Cooking School weekly topics:  Adding Flavor- Sodium-Free  Fast and Healthy Breakfasts  Powerhouse Plant-Based Proteins  Satisfying Salads and Dressings  Simple Sides and Sauces  International Cuisine-Spotlight on the [place] Zones  Delicious Desserts  Savory Soups  Efficiency Cooking - Meals in a Snap  Tasty Appetizers and Snacks  Comforting Weekend Breakfasts  One-Pot Wonders   Fast Evening Meals  Easy [place] (Psychosocial): New Thoughts, New Behaviors Clinical staff led group instruction and group discussion with PowerPoint presentation and patient guidebook. To enhance the learning environment the use of posters, models and videos may be added. Patients will learn and practice techniques for developing effective health and lifestyle goals. Patients will be able to effectively apply the goal setting process learned to develop at least one new personal goal.  The purpose of this lesson is to expose patients to a new skill set of behavior modification techniques such as techniques setting SMART goals, overcoming barriers, and achieving new thoughts  and new behaviors.  Managing Moods and Relationships Clinical staff led group instruction and group discussion with PowerPoint presentation and patient guidebook. To enhance the learning environment the use of posters, models and videos may be added. Patients will learn how emotional and chronic stress factors can impact their health and relationships. They will learn healthy ways to manage their  moods and utilize positive coping mechanisms. In addition, ICR patients will learn ways to improve communication skills. The purpose of this lesson is to expose patients to ways of understanding how one's mood and health are intimately connected. Developing a healthy outlook can help build positive relationships and connections with others. Patients will understand the importance of utilizing effective communication skills that include actively listening and being heard. They will learn and understand the importance of the "4 Cs" and especially Connections in fostering of a Healthy Mind-Set.  Healthy Sleep for a [facility] Clinical staff led group instruction and group discussion with PowerPoint presentation and patient guidebook. To enhance the learning environment the use of posters, models and videos may be added. At the conclusion of this workshop, patients will be able to demonstrate knowledge of the importance of sleep to overall health, well-being, and quality of life. They will understand the symptoms of, and treatments for, common sleep disorders. Patients will also be able to identify daytime and nighttime behaviors which impact sleep, and they will be able to apply these tools to help manage sleep-related challenges. The purpose of this lesson is to provide patients with a general overview of sleep and outline the importance of quality sleep. Patients will learn about a few of the most common sleep disorders. Patients will also be introduced to the concept of "sleep hygiene," and discover ways to self-manage certain sleeping problems through simple daily behavior changes. Finally, the workshop will motivate patients by clarifying the links between quality sleep and their goals of heart-healthy living.   Recognizing and Reducing Stress Clinical staff led group instruction and group discussion with PowerPoint presentation and patient guidebook. To enhance the learning environment the use of  posters, models and videos may be added. At the conclusion of this workshop, patients will be able to understand the types of stress reactions, differentiate between acute and chronic stress, and recognize the impact that chronic stress has on their health. They will also be able to apply different coping mechanisms, such as reframing negative self-talk. Patients will have the opportunity to practice a variety of stress management techniques, such as deep abdominal breathing, progressive muscle relaxation, and/or guided imagery.  The purpose of this lesson is to educate patients on the role of stress in their lives and to provide healthy techniques for coping with it.  Learning Barriers/Preferences:  Learning Barriers/Preferences - 03/25/22 1500       Learning Barriers/Preferences   Learning Barriers Sight;Exercise Concerns   some dizziness, wears glasses   Learning Preferences Computer/Internet;Individual Instruction;Group Instruction;Pictoral;Written Material             Education Topics:  Knowledge Questionnaire Score:  Knowledge Questionnaire Score - 03/25/22 1501       Knowledge Questionnaire Score   Pre Score 22/24             Core Components/Risk Factors/Patient Goals at Admission:  Personal Goals and Risk Factors at Admission - 03/25/22 1502       Core Components/Risk Factors/Patient Goals on Admission    Weight Management Weight Maintenance;Yes    Intervention Weight Management: Provide education and appropriate resources to  help participant work on and attain dietary goals.;Weight Management: Develop a combined nutrition and exercise program designed to reach desired caloric intake, while maintaining appropriate intake of nutrient and fiber, sodium and fats, and appropriate energy expenditure required for the weight goal.    Expected Outcomes Weight Maintenance: Understanding of the daily nutrition guidelines, which includes 25-35% calories from fat, 7% or less cal  from saturated fats, less than 273m cholesterol, less than 1.5gm of sodium, & 5 or more servings of fruits and vegetables daily;Understanding recommendations for meals to include 15-35% energy as protein, 25-35% energy from fat, 35-60% energy from carbohydrates, less than 2073mof dietary cholesterol, 20-35 gm of total fiber daily;Understanding of distribution of calorie intake throughout the day with the consumption of 4-5 meals/snacks    Lipids Yes    Intervention Provide education and support for participant on nutrition & aerobic/resistive exercise along with prescribed medications to achieve LDL <7056mHDL >63m4m  Expected Outcomes Short Term: Participant states understanding of desired cholesterol values and is compliant with medications prescribed. Participant is following exercise prescription and nutrition guidelines.;Long Term: Cholesterol controlled with medications as prescribed, with individualized exercise RX and with personalized nutrition plan. Value goals: LDL < 70mg63mL > 40 mg.    Stress Yes    Intervention Offer individual and/or small group education and counseling on adjustment to heart disease, stress management and health-related lifestyle change. Teach and support self-help strategies.;Refer participants experiencing significant psychosocial distress to appropriate mental health specialists for further evaluation and treatment. When possible, include family members and significant others in education/counseling sessions.    Expected Outcomes Short Term: Participant demonstrates changes in health-related behavior, relaxation and other stress management skills, ability to obtain effective social support, and compliance with psychotropic medications if prescribed.;Long Term: Emotional wellbeing is indicated by absence of clinically significant psychosocial distress or social isolation.    Personal Goal Other Yes    Personal Goal Short: get back on his feet Long: inc. strength     Intervention Will continue to monitor pt and progress workloads as tolerated without sign or symptom    Expected Outcomes Pt will achieve his goals and gain strength.             Core Components/Risk Factors/Patient Goals Review:   Goals and Risk Factor Review     Row Name 04/02/22 1656             Core Components/Risk Factors/Patient Goals Review   Personal Goals Review Weight Management/Obesity;Lipids;Stress       Review [name] started cardiac rehab on 04/02/22 and did well with exercise, vital signs stable       Expected Outcomes [name] will continue to participate in intensive cardiac rehab for exercise, nutrition and lifestyle modifications                Core Components/Risk Factors/Patient Goals at Discharge (Final Review):   Goals and Risk Factor Review - 04/02/22 1656       Core Components/Risk Factors/Patient Goals Review   Personal Goals Review Weight Management/Obesity;Lipids;Stress    Review [name] started cardiac rehab on 04/02/22 and did well with exercise, vital signs stable    Expected Outcomes [name] will continue to participate in intensive cardiac rehab for exercise, nutrition and lifestyle modifications             ITP Comments:  ITP Comments     Row Name 03/25/22 1339 04/05/22 0842         ITP Comments Dr  Fransico Him MD, Medical Director, Introduction to [facility] Education Program/ Intensive Cardiac Rehab. Initial Orientation Packet Reviewed with the patient 30 Day ITP Review. [name] started intensive cardiac rehab on 04/02/22 and did well with exercise.               Comments: See ITP comments.Harrell Gave RN BSN

## 2022-04-07 ENCOUNTER — Encounter (HOSPITAL_COMMUNITY)
Admission: RE | Admit: 2022-04-07 | Discharge: 2022-04-07 | Disposition: A | Discharge status: Home / Self Care | Payer: No Typology Code available for payment source | Source: Ambulatory Visit | Attending: Cardiology | Admitting: Cardiology

## 2022-04-07 DIAGNOSIS — I2102 ST elevation (STEMI) myocardial infarction involving left anterior descending coronary artery: Secondary | ICD-10-CM | POA: Diagnosis not present

## 2022-04-07 DIAGNOSIS — Z955 Presence of coronary angioplasty implant and graft: Secondary | ICD-10-CM

## 2022-04-09 ENCOUNTER — Encounter (HOSPITAL_COMMUNITY)
Admission: RE | Admit: 2022-04-09 | Discharge: 2022-04-09 | Disposition: A | Discharge status: Home / Self Care | Payer: No Typology Code available for payment source | Source: Ambulatory Visit | Attending: Cardiology | Admitting: Cardiology

## 2022-04-09 DIAGNOSIS — Z955 Presence of coronary angioplasty implant and graft: Secondary | ICD-10-CM

## 2022-04-09 DIAGNOSIS — I2102 ST elevation (STEMI) myocardial infarction involving left anterior descending coronary artery: Secondary | ICD-10-CM

## 2022-04-12 ENCOUNTER — Telehealth: Payer: Self-pay | Admitting: Cardiology

## 2022-04-12 ENCOUNTER — Encounter (HOSPITAL_COMMUNITY): Payer: Self-pay | Admitting: Emergency Medicine

## 2022-04-12 ENCOUNTER — Other Ambulatory Visit: Payer: Self-pay

## 2022-04-12 ENCOUNTER — Emergency Department (HOSPITAL_COMMUNITY)
Admission: EM | Admit: 2022-04-12 | Discharge: 2022-04-12 | Disposition: A | Discharge status: Home / Self Care | Payer: No Typology Code available for payment source | Attending: Emergency Medicine | Admitting: Emergency Medicine

## 2022-04-12 ENCOUNTER — Emergency Department (HOSPITAL_COMMUNITY): Payer: No Typology Code available for payment source

## 2022-04-12 DIAGNOSIS — I5042 Chronic combined systolic (congestive) and diastolic (congestive) heart failure: Secondary | ICD-10-CM

## 2022-04-12 DIAGNOSIS — Z7982 Long term (current) use of aspirin: Secondary | ICD-10-CM | POA: Diagnosis not present

## 2022-04-12 DIAGNOSIS — I2510 Atherosclerotic heart disease of native coronary artery without angina pectoris: Secondary | ICD-10-CM | POA: Diagnosis not present

## 2022-04-12 DIAGNOSIS — I5020 Unspecified systolic (congestive) heart failure: Secondary | ICD-10-CM | POA: Insufficient documentation

## 2022-04-12 DIAGNOSIS — R0789 Other chest pain: Secondary | ICD-10-CM | POA: Diagnosis present

## 2022-04-12 LAB — BASIC METABOLIC PANEL
Anion gap: 9 (ref 5–15)
BUN: 8 mg/dL (ref 6–20)
CO2: 23 mmol/L (ref 22–32)
Calcium: 9 mg/dL (ref 8.9–10.3)
Chloride: 106 mmol/L (ref 98–111)
Creatinine, Ser: 0.87 mg/dL (ref 0.61–1.24)
GFR, Estimated: >60 mL/min (ref >60)
Glucose, Bld: 84 mg/dL (ref 70–99)
Potassium: 4.5 mmol/L (ref 3.5–5.1)
Sodium: 138 mmol/L (ref 135–145)

## 2022-04-12 LAB — CBC
HCT: 43.8 % (ref 39.0–52.0)
Hemoglobin: 14.3 g/dL (ref 13.0–17.0)
MCH: 29 pg (ref 26.0–34.0)
MCHC: 32.6 g/dL (ref 30.0–36.0)
MCV: 88.8 fL (ref 80.0–100.0)
Platelets: 233 10*3/uL (ref 150–400)
RBC: 4.93 MIL/uL (ref 4.22–5.81)
RDW: 13 % (ref 11.5–15.5)
WBC: 5.8 10*3/uL (ref 4.0–10.5)
nRBC: 0 % (ref 0.0–0.2)

## 2022-04-12 LAB — TROPONIN I (HIGH SENSITIVITY)
Troponin I (High Sensitivity): 14 ng/L (ref <18)
Troponin I (High Sensitivity): 14 ng/L (ref <18)

## 2022-04-12 NOTE — Consult Note (Addendum)
Cardiology Consultation   [name] MRN: [id number]; DOB: 1966-04-24  Admit date: 04/12/2022 Date of Consult: 04/12/2022  PCP:  Bradd Canary, MD   [facility] Providers Cardiologist:  Peter Swaziland, MD        Patient Profile:   Michael Gibbs is a 56 y.o. male with a hx of CAD status post STEMI with DES to proximal LAD 01/20/2022, ischemic cardiomyopathy (EF 30 to 35%), OSA, hyperlipidemia who is being seen 04/12/2022 for the evaluation of chest pain at the request of Dr. Jeraldine Loots.  History of Present Illness:   [name] is a 56 year old male with the above medical history who presents with chest pain.  He was admitted in July 2023 with STEMI.  LHC showed occluded proximal LAD, underwent successful PCI with DES to LAD.  Echocardiogram showed EF 30 to 35%.  He was started on DAPT with aspirin and Brilinta, as well as high intensity statin and beta-blocker and Farxiga.  He was not initially started on ACE/ARB/ARNI or MRA due to low BP.  Repeat echocardiogram 03/29/2022 showed EF remains 30 to 35%.  He is currently on losartan 25 mg daily, Farxiga 10 mg daily, spironolactone 12.5 mg daily, aspirin and Brilinta.  He presents today with chest pain.  Reports he woke up this morning with chest pain.  Describes dull aching pain on left side of chest.  Reports it felt different than chest pain he had with his MI in July.  Reports pain was 2 out of 10 in intensity.  States the pain persisted for about 2 hours.  In ED, vital signs notable for BP 106/69, pulse 63, SPO2 100% on room air.  Labs notable for creatinine 0.87, hemoglobin 14.3, troponin 14 > 14.  EKG shows sinus rhythm, rate 67, Q waves in V1-3 with 1 mm ST elevations in V3-4 (unchanged from prior EKG).  Chest x-ray unremarkable.   Past Medical History:  Diagnosis Date   Allergic state 04/25/2007   Seasonal allergies-well controlled with Allegra and Flonase     CAD (coronary artery disease)     a. 12/2021: s/p STEMI with DES to proximal LAD   Dermatitis 09/22/2011   Headache(784.0)    Headache(784.0) 09/22/2011   HFrEF (heart failure with reduced ejection fraction) (HCC)    a. EF 30-35% in 12/2021 in the setting of anterior STEMI   Hyperlipidemia, mixed 08/11/2014   Preventative health care 04/02/2011   Sleep apnea    Sleep apnea     Past Surgical History:  Procedure Laterality Date   CARDIAC CATHETERIZATION     CORONARY/GRAFT ACUTE MI REVASCULARIZATION N/A 01/20/2022   Procedure: Coronary/Graft Acute MI Revascularization;  Surgeon: Swaziland, Peter M, MD;  Location: [facility] INVASIVE CV LAB;  Service: Cardiovascular;  Laterality: N/A;   LEFT HEART CATH AND CORONARY ANGIOGRAPHY N/A 01/20/2022   Procedure: LEFT HEART CATH AND CORONARY ANGIOGRAPHY;  Surgeon: Swaziland, Peter M, MD;  Location: [facility] INVASIVE CV LAB;  Service: Cardiovascular;  Laterality: N/A;      Inpatient Medications: Scheduled Meds:  Continuous Infusions:  PRN Meds:   Allergies:   No Known Allergies  Social History:   Social History   Socioeconomic History   Marital status: Married    Spouse name: Not on file   Number of children: 2   Years of education: 16   Highest education level: Master's degree (e.g., MA, MS, MEng, MEd, MSW, MBA)  Occupational History   Not on file  Tobacco Use  Smoking status: Never   Smokeless tobacco: Never  Vaping Use   Vaping Use: Not on file  Substance and Sexual Activity   Alcohol use: Yes    Comment: rare beer   Drug use: No   Sexual activity: Yes    Comment: lives with wife,  works in [place], vegetarian, wears a seat baelt  Other Topics Concern   Not on file  Social History Narrative   Patient is from [place] but lives in [place], has been in the [place] since 1996, lives with wife and 2 children-ages 18 and 90, wife works from home, her office is in [place]. Remainder family lives in [place]. Patient works in [employer], technical sales. Enjoys tennis. Vegetarian.   Social  Determinants of Health   Financial Resource Strain: Not on file  Food Insecurity: Not on file  Transportation Needs: Not on file  Physical Activity: Not on file  Stress: Not on file  Social Connections: Not on file  Intimate Partner Violence: Not on file    Family History:    Family History  Problem Relation Age of Onset   Diabetes Mother        diet controlled DM   Other Father        bowel obstruction with resection, hyponatremia   Ulcers Father    Depression Brother    Cancer Paternal Grandmother        esophageal cancer   GER disease Paternal Grandmother      ROS:  Please see the history of present illness.   All other ROS reviewed and negative.     Physical Exam/Data:   Vitals:   04/12/22 1422 04/12/22 1433 04/12/22 1515 04/12/22 1530  BP:  127/80 98/66 96/68   Pulse:  63 (!) 55 (!) 56  Resp:  17 13 15   Temp:  98.1 F (36.7 C)    TempSrc:  Oral    SpO2: 99% 100% 100% 100%  Weight:      Height:       No intake or output data in the 24 hours ending 04/12/22 1818    04/12/2022    2:20 PM 03/25/2022    2:38 PM 03/24/2022    2:41 PM  Last 3 Weights  Weight (lbs) 154 lb 157 lb 6.5 oz 158 lb 12.8 oz  Weight (kg) 69.854 kg 71.4 kg 72.031 kg     Body mass index is 22.74 kg/m.  General:   in no acute distress HEENT: normal Neck: no JVD Cardiac:  normal S1, S2; RRR; no murmur  Lungs:  clear to auscultation bilaterally, no wheezing, rhonchi or rales  Abd: soft, nontender Ext: no edema Musculoskeletal:  No deformities Skin: warm and dry  Neuro:  no focal abnormalities noted Psych:  Normal affect   EKG:  The EKG was personally reviewed and demonstrates:  sinus rhythm, rate 67, Q waves in V1-3 with 1 mm ST elevations in V3-4 (unchanged from prior EKG) Telemetry:  Telemetry was personally reviewed and demonstrates:  sinus bradycardia in 50s  Relevant CV Studies:   Laboratory Data:  High Sensitivity Troponin:   Recent Labs  Lab 04/12/22 1012  04/12/22 1428  TROPONINIHS 14 14     Chemistry Recent Labs  Lab 04/12/22 1012  NA 138  K 4.5  CL 106  CO2 23  GLUCOSE 84  BUN 8  CREATININE 0.87  CALCIUM 9.0  GFRNONAA >60  ANIONGAP 9    No results for input(s): "PROT", "ALBUMIN", "AST", "ALT", "ALKPHOS", "BILITOT" in the  last 168 hours. Lipids No results for input(s): "CHOL", "TRIG", "HDL", "LABVLDL", "LDLCALC", "CHOLHDL" in the last 168 hours.  Hematology Recent Labs  Lab 04/12/22 1012  WBC 5.8  RBC 4.93  HGB 14.3  HCT 43.8  MCV 88.8  MCH 29.0  MCHC 32.6  RDW 13.0  PLT 233   Thyroid No results for input(s): "TSH", "FREET4" in the last 168 hours.  BNPNo results for input(s): "BNP", "PROBNP" in the last 168 hours.  DDimer No results for input(s): "DDIMER" in the last 168 hours.   Radiology/Studies:  DG Chest 2 View  Result Date: 04/12/2022 CLINICAL DATA:  Left-sided chest pain beginning yesterday. EXAM: CHEST - 2 VIEW COMPARISON:  01/21/2022 FINDINGS: The heart size and mediastinal contours are within normal limits. Both lungs are clear. The visualized skeletal structures are unremarkable. IMPRESSION: No active cardiopulmonary disease. Electronically Signed   By: Danae Orleans M.D.   On: 04/12/2022 10:36     Assessment and Plan:   Chest pain: history of CAD status post STEMI with DES to proximal LAD 01/20/2022.  Reports compliance with aspirin and Brilinta.  Presents today with atypical chest pain, reports pain different that prior MI.  EKG unchanged from prior.  Given chest pain lasted 2 hours with negative troponins, suspect noncardiac chest pain.  Reports has been exercising regularly, denies any exertional chest pain. No further cardiac work-up recommended at this time, has follow-up with Dr. Swaziland on 04/21/2022.  Continue ASA, Brilinta.  Chronic combined heart failure: EF 30 to 35%.  Appears euvolemic. Continue Farxiga, spironolactone, losartan.  Not on beta-blocker, resting heart rate in 50s.  Not on Entresto  due to soft BP   For questions or updates, please contact [facility] Please consult [email or web address] for contact info under    Signed, Little Ishikawa, MD  04/12/2022 6:18 PM

## 2022-04-12 NOTE — Telephone Encounter (Signed)
Spoke to patient. Patient is driving at present time. Patient states  it has been occurring since he woke up this morning.  Pain scale of 2 out 10.  Mid sternum, no radiation , no nausea , no radiation  ,no change in pain when patient takes a deep breath. Patient has not tried NTG.  Patient is stating he is on the way to have lab drawn at [facility] office.  RN informed patient if he is having active pain  recommendation is to have patient try  taking the NTG tablets if  unable and/or no relief  to go to the ER.  Patient verbalized understanding.

## 2022-04-12 NOTE — Telephone Encounter (Signed)
Pt c/o of Chest Pain: STAT if CP now or developed within 24 hours  1. Are you having CP right now? yes  2. Are you experiencing any other symptoms (ex. SOB, nausea, vomiting, sweating)?  3. How long have you been experiencing CP?  4. Is your CP continuous or coming and going?   5. Have you taken Nitroglycerin?  ? 

## 2022-04-12 NOTE — ED Triage Notes (Signed)
Patient here for evaluation of 2/10 throbbing chest pain that woke him this morning at 0700 and lasted a couple of hours before it resolved spontaneously. Patient denies any pain at this time. Patient denies dizziness, nausea, and shortness of breath during episode of pain. Patient denies having any complaints at this time.

## 2022-04-12 NOTE — ED Provider Notes (Signed)
[facility] EMERGENCY DEPARTMENT Provider Note   CSN: [id number] Arrival date & time: 04/12/22  0865     History  Chief Complaint  Patient presents with   Chest Pain    Michael Gibbs is a 56 y.o. male.  HPI Patient with a history of MI in July of this year presents with concern for chest pain.  He notes that since that time he has had episodes of intermittent waxing, waning mild severity discomfort in the left upper chest.  Today he had a similar episode, and though he was scheduled for cardiology follow-up they advised him to come to the emergency department for expedited evaluation.  Currently he is asymptomatic.  He notes that he has been taking all medication as directed including Brilinta and aspirin.    Home Medications Prior to Admission medications   Medication Sig Start Date End Date Taking? Authorizing Provider  aspirin EC 81 MG tablet Take 1 tablet (81 mg total) by mouth daily. Swallow whole. 01/24/22  Yes Strader, [place] M, PA-C  dapagliflozin propanediol (FARXIGA) 10 MG TABS tablet Take 1 tablet (10 mg total) by mouth daily. 01/24/22  Yes Strader, [place] M, PA-C  losartan (COZAAR) 25 MG tablet Take 1 tablet (25 mg total) by mouth daily. 02/11/22  Yes Martinique, Peter M, MD  nitroGLYCERIN (NITROSTAT) 0.4 MG SL tablet Place 1 tablet (0.4 mg total) under the tongue every 5 (five) minutes x 3 doses as needed for chest pain. 01/23/22  Yes Strader, [place], PA-C  spironolactone (ALDACTONE) 25 MG tablet Take 1/2 tablet ( 12.5 mg ) daily 04/02/22  Yes Martinique, Peter M, MD  ticagrelor (BRILINTA) 90 MG TABS tablet Take 1 tablet (90 mg total) by mouth 2 (two) times daily. 01/23/22  Yes Strader, Fransisco Hertz, PA-C      Allergies    Patient has no known allergies.    Review of Systems   Review of Systems  All other systems reviewed and are negative.   Physical Exam Updated Vital Signs BP 96/68   Pulse (!) 56   Temp 98.1 F (36.7 C) (Oral)   Resp  15   Ht 5\' 9"  (1.753 m)   Wt 69.9 kg   SpO2 100%   BMI 22.74 kg/m  Physical Exam Vitals and nursing note reviewed.  Constitutional:      General: He is not in acute distress.    Appearance: He is well-developed.  HENT:     Head: Normocephalic and atraumatic.  Eyes:     Conjunctiva/sclera: Conjunctivae normal.  Cardiovascular:     Rate and Rhythm: Normal rate and regular rhythm.  Pulmonary:     Effort: Pulmonary effort is normal. No respiratory distress.     Breath sounds: No stridor.  Abdominal:     General: There is no distension.  Skin:    General: Skin is warm and dry.  Neurological:     Mental Status: He is alert and oriented to person, place, and time.     ED Results / Procedures / Treatments   Labs (all labs ordered are listed, but only abnormal results are displayed) Labs Reviewed  BASIC METABOLIC PANEL  CBC  TROPONIN I (HIGH SENSITIVITY)  TROPONIN I (HIGH SENSITIVITY)    EKG EKG Interpretation  Date/Time:  Monday April 12 2022 14:27:16 EDT Ventricular Rate:  61 PR Interval:  149 QRS Duration: 97 QT Interval:  422 QTC Calculation: 426 R Axis:   91 Text Interpretation: Sinus rhythm Posterior infarct, old  Probable lateral infarct, age indeterminate similar to prior with different lead placement Abnormal ECG Confirmed by Gerhard Munch [phone]) on 04/12/2022 3:11:23 PM  Radiology DG Chest 2 View  Result Date: 04/12/2022 CLINICAL DATA:  Left-sided chest pain beginning yesterday. EXAM: CHEST - 2 VIEW COMPARISON:  01/21/2022 FINDINGS: The heart size and mediastinal contours are within normal limits. Both lungs are clear. The visualized skeletal structures are unremarkable. IMPRESSION: No active cardiopulmonary disease. Electronically Signed   By: Danae Orleans M.D.   On: 04/12/2022 10:36    Procedures Procedures    Medications Ordered in ED Medications - No data to display  ED Course/ Medical Decision Making/ A&P                           Medical  Decision Making As above patient with a history of MI, now presents with episodic chest pain.  Patient is awake, alert, speaking clearly, asymptomatic for my evaluation, given his recent history, elevated risk profile abnormal ECG I discussed this case with our cardiology colleagues.  Fortunately patient's 2 troponins are both normal, he is asymptomatic here, and pending cardiology evaluation likely appropriate for close outpatient follow-up though hospitalization was a consideration given his risk profile elevated as above.  Amount and/or Complexity of Data Reviewed External Data Reviewed: notes. Labs: ordered. Decision-making details documented in ED Course. Radiology: ordered. Decision-making details documented in ED Course. ECG/medicine tests:  Decision-making details documented in ED Course.  Risk Prescription drug management. Decision regarding hospitalization.  Final Clinical Impression(s) / ED Diagnoses Final diagnoses:  Atypical chest pain     Gerhard Munch, MD 04/12/22 1539

## 2022-04-12 NOTE — Discharge Instructions (Addendum)
As discussed, your evaluation today has been largely reassuring.  But, it is important that you monitor your condition carefully, and do not hesitate to return to the ED if you develop new, or concerning changes in your condition. ? ?Otherwise, please follow-up with your physician for appropriate ongoing care. ? ?

## 2022-04-14 ENCOUNTER — Encounter (HOSPITAL_COMMUNITY)
Admission: RE | Admit: 2022-04-14 | Discharge: 2022-04-14 | Disposition: A | Discharge status: Home / Self Care | Payer: No Typology Code available for payment source | Source: Ambulatory Visit | Attending: Cardiology | Admitting: Cardiology

## 2022-04-14 ENCOUNTER — Other Ambulatory Visit: Payer: Self-pay

## 2022-04-14 DIAGNOSIS — I25118 Atherosclerotic heart disease of native coronary artery with other forms of angina pectoris: Secondary | ICD-10-CM

## 2022-04-14 DIAGNOSIS — I2102 ST elevation (STEMI) myocardial infarction involving left anterior descending coronary artery: Secondary | ICD-10-CM

## 2022-04-14 DIAGNOSIS — Z955 Presence of coronary angioplasty implant and graft: Secondary | ICD-10-CM

## 2022-04-14 NOTE — Progress Notes (Signed)
Incomplete Session Note  Patient Details  Name: Michael Gibbs MRN: [id number] Date of Birth: [birth year] Referring Provider:   Flowsheet Row INTENSIVE CARDIAC REHAB ORIENT from 03/25/2022 in [facility]  Referring Provider Dr. Peter Martinique MD       Michael Gibbs did not complete his rehab session.  It was noted that Michael Gibbs went to the ED with chest pain on 04/22/22 and was discharged. Michael Gibbs denies having any symptoms today. Will check with Dr Martinique about return to exercise. Michael Gibbs says he has cold symptoms and does not want to exercise today. Michael Gibbs hope to return to exercise on Friday.Harrell Gave RN BSN

## 2022-04-15 LAB — BASIC METABOLIC PANEL
BUN/Creatinine Ratio: 15 (ref 9–20)
BUN: 12 mg/dL (ref 6–24)
CO2: 26 mmol/L (ref 20–29)
Calcium: 10 mg/dL (ref 8.7–10.2)
Chloride: 101 mmol/L (ref 96–106)
Creatinine, Ser: 0.81 mg/dL (ref 0.76–1.27)
Glucose: 73 mg/dL (ref 70–99)
Potassium: 4.7 mmol/L (ref 3.5–5.2)
Sodium: 141 mmol/L (ref 134–144)
eGFR: 103 mL/min/{1.73_m2} (ref >59)

## 2022-04-15 LAB — HEPATIC FUNCTION PANEL
ALT: 14 IU/L (ref 0–44)
AST: 16 IU/L (ref 0–40)
Albumin: 4.8 g/dL (ref 3.8–4.9)
Alkaline Phosphatase: 103 IU/L (ref 44–121)
Bilirubin Total: 1.4 mg/dL — ABNORMAL HIGH (ref 0.0–1.2)
Bilirubin, Direct: 0.3 mg/dL (ref 0.00–0.40)
Total Protein: 7.1 g/dL (ref 6.0–8.5)

## 2022-04-16 ENCOUNTER — Telehealth (HOSPITAL_COMMUNITY): Payer: Self-pay | Admitting: Family Medicine

## 2022-04-16 ENCOUNTER — Telehealth (HOSPITAL_COMMUNITY): Payer: Self-pay | Admitting: *Deleted

## 2022-04-16 ENCOUNTER — Encounter (HOSPITAL_COMMUNITY): Admission: RE | Admit: 2022-04-16 | Payer: No Typology Code available for payment source | Source: Ambulatory Visit

## 2022-04-16 NOTE — Telephone Encounter (Signed)
Spoke with Michael Gibbs let him know that Dr Martinique has cleared him to return to exercise. I asked Michael Gibbs to take a home Covid test before returning to exercise on next Wednesday. Patient states understanding.Barnet Pall, RN,BSN 04/16/2022 3:43 PM

## 2022-04-18 NOTE — Progress Notes (Unsigned)
Cardiology Office Note:    Date:  04/21/2022   ID:  Delmore, Dion [birth year], MRN [id number]  PCP:  Bradd Canary, MD   [facility] Providers Cardiologist:  Asencion Guisinger [place], MD     Referring MD: Bradd Canary, MD   Chief Complaint  Patient presents with   Follow-up   Coronary Artery Disease    History of Present Illness:    Michael Gibbs is a 56 y.o. male with a hx of CAD. He was admitted in July 2023 with STEMI.  LHC showed occluded proximal LAD, underwent successful PCI with DES to LAD.  Echocardiogram showed EF 30 to 35%.  He was started on DAPT with aspirin and Brilinta, as well as high intensity statin and beta-blocker and Farxiga.  He was not initially started on ACE/ARB/ARNI or MRA due to low BP.  Repeat echocardiogram 03/29/2022 showed EF remains 30 to 35%.  He is currently on losartan 25 mg daily, Farxiga 10 mg daily, spironolactone 12.5 mg daily, aspirin and Brilinta.  He did develop significant myalgias on high dose lipitor. Also had some bilirubin elevation. These symptoms resolved off lipitor. He is tolerating his other medication well. Denies any dizziness, dyspnea or palpitations. He just started Cardiac Rehab. He did lose about 16 lbs since his MI but weight is stabilizing. He has some minor isolated chest pain 1/10. He is up to walking 2 miles/twice a day.     Past Medical History:  Diagnosis Date   Allergic state 04/25/2007   Seasonal allergies-well controlled with Allegra and Flonase     CAD (coronary artery disease)    a. 12/2021: s/p STEMI with DES to proximal LAD   Dermatitis 09/22/2011   Headache(784.0)    Headache(784.0) 09/22/2011   HFrEF (heart failure with reduced ejection fraction) (HCC)    a. EF 30-35% in 12/2021 in the setting of anterior STEMI   Hyperlipidemia, mixed 08/11/2014   Preventative health care 04/02/2011   Sleep apnea    Sleep apnea     Past Surgical History:  Procedure Laterality Date   CARDIAC  CATHETERIZATION     CORONARY/GRAFT ACUTE MI REVASCULARIZATION N/A 01/20/2022   Procedure: Coronary/Graft Acute MI Revascularization;  Surgeon: [place], Zaylynn Rickett M, MD;  Location: [facility] INVASIVE CV LAB;  Service: Cardiovascular;  Laterality: N/A;   LEFT HEART CATH AND CORONARY ANGIOGRAPHY N/A 01/20/2022   Procedure: LEFT HEART CATH AND CORONARY ANGIOGRAPHY;  Surgeon: [place], Vashon Arch M, MD;  Location: [facility] INVASIVE CV LAB;  Service: Cardiovascular;  Laterality: N/A;    Current Medications: Current Meds  Medication Sig   aspirin EC 81 MG tablet Take 1 tablet (81 mg total) by mouth daily. Swallow whole.   dapagliflozin propanediol (FARXIGA) 10 MG TABS tablet Take 1 tablet (10 mg total) by mouth daily.   nitroGLYCERIN (NITROSTAT) 0.4 MG SL tablet Place 1 tablet (0.4 mg total) under the tongue every 5 (five) minutes x 3 doses as needed for chest pain.   spironolactone (ALDACTONE) 25 MG tablet Take 1/2 tablet ( 12.5 mg ) daily   ticagrelor (BRILINTA) 90 MG TABS tablet Take 1 tablet (90 mg total) by mouth 2 (two) times daily.   [DISCONTINUED] losartan (COZAAR) 25 MG tablet Take 1 tablet (25 mg total) by mouth daily.     Allergies:   Patient has no known allergies.   Social History   Socioeconomic History   Marital status: Married    Spouse name: Not on file   Number of children:  2   Years of education: 64   Highest education level: Master's degree (e.g., MA, MS, MEng, MEd, MSW, MBA)  Occupational History   Not on file  Tobacco Use   Smoking status: Never   Smokeless tobacco: Never  Vaping Use   Vaping Use: Not on file  Substance and Sexual Activity   Alcohol use: Yes    Comment: rare beer   Drug use: No   Sexual activity: Yes    Comment: lives with wife,  works in [employer], vegetarian, wears a seat baelt  Other Topics Concern   Not on file  Social History Narrative   Patient is from [place] but lives in [place], has been in the [place] since 1996, lives with wife and 2 children-ages 61 and  56, wife works from home, her office is in [place]. Remainder family lives in [place]. Patient works in Engineer, technical sales. Enjoys tennis. Vegetarian.   Social Determinants of Health   Financial Resource Strain: Not on file  Food Insecurity: Not on file  Transportation Needs: Not on file  Physical Activity: Not on file  Stress: Not on file  Social Connections: Not on file     Family History: The patient's family history includes Cancer in his paternal grandmother; Depression in his brother; Diabetes in his mother; GER disease in his paternal grandmother; Other in his father; Ulcers in his father.  ROS:   Please see the history of present illness.     All other systems reviewed and are negative.  EKGs/Labs/Other Studies Reviewed:    The following studies were reviewed today: Cardiac cath 01/20/22:  Coronary/Graft Acute MI Revascularization  LEFT HEART CATH AND CORONARY ANGIOGRAPHY   Conclusion      Prox LAD lesion is 100% stenosed.   Mid Cx to Dist Cx lesion is 20% stenosed with 20% stenosed side branch in LPAV.   A drug-eluting stent was successfully placed using a SYNERGY XD [id number].   Post intervention, there is a 0% residual stenosis.   LV end diastolic pressure is moderately elevated.   Single vessel occlusive CAD involving the proximal LAD Moderately elevated LVEDP 30 mm Hg Successful PCI of the proximal LAD with DES x 1.   Plan: DAPT for one year.   Diagnostic Dominance: Left  Intervention   Echo 03/29/22: IMPRESSIONS     1. Septal/apical akinesis inferior apical akinesis Mid/baseal inferior  wall hypokinesis . Left ventricular ejection fraction, by estimation, is  30 to 35%. The left ventricle has moderately decreased function. The left  ventricle demonstrates regional  wall motion abnormalities (see scoring diagram/findings for description).  The left ventricular internal cavity size was moderately dilated. There is  mild asymmetric left ventricular hypertrophy of the  basal and septal  segments. Left ventricular diastolic   parameters were normal.   2. Right ventricular systolic function is normal. The right ventricular  size is normal.   3. The mitral valve is abnormal. Mild mitral valve regurgitation. No  evidence of mitral stenosis.   4. The aortic valve is tricuspid. There is mild calcification of the  aortic valve. Aortic valve regurgitation is not visualized. Aortic valve  sclerosis is present, with no evidence of aortic valve stenosis.   5. The inferior vena cava is normal in size with greater than 50%  respiratory variability, suggesting right atrial pressure of 3 mmHg.    EKG:  EKG is not ordered today.  The ekg ordered today demonstrates N/A. Ecg done 04/12/22 showed prior anterior infarct without  acute change. I have personally reviewed and interpreted this study.   Recent Labs: 01/21/2022: TSH 1.993 01/22/2022: B Natriuretic Peptide 225.6; Magnesium 2.0 04/12/2022: Hemoglobin 14.3; Platelets 233 04/14/2022: ALT 14; BUN 12; Creatinine, Ser 0.81; Potassium 4.7; Sodium 141  Recent Lipid Panel    Component Value Date/Time   CHOL 99 (L) 02/22/2022 0931   TRIG 82 02/22/2022 0931   HDL 33 (L) 02/22/2022 0931   CHOLHDL 3.0 02/22/2022 0931   CHOLHDL 4.7 01/20/2022 2245   VLDL 27 01/20/2022 2245   LDLCALC 49 02/22/2022 0931   LDLCALC 132 (H) 04/15/2020 1155   LDLDIRECT 110 (H) 04/25/2007 1845     Risk Assessment/Calculations:           STOP-Bang Score:  3       Physical Exam:    VS:  BP 110/60 (BP Location: Left Arm, Patient Position: Sitting, Cuff Size: Normal)   Pulse 75   Ht 5\' 9"  (1.753 m)   Wt 155 lb 14.4 oz (70.7 kg)   SpO2 99%   BMI 23.02 kg/m     Wt Readings from Last 3 Encounters:  04/21/22 155 lb 14.4 oz (70.7 kg)  04/12/22 154 lb (69.9 kg)  03/25/22 157 lb 6.5 oz (71.4 kg)     GEN:  Well nourished, well developed in no acute distress HEENT: Normal NECK: No JVD; No carotid bruits LYMPHATICS: No  lymphadenopathy CARDIAC: RRR, no murmurs, rubs, gallops RESPIRATORY:  Clear to auscultation without rales, wheezing or rhonchi  ABDOMEN: Soft, non-tender, non-distended MUSCULOSKELETAL:  No edema; No deformity  SKIN: Warm and dry NEUROLOGIC:  Alert and oriented x 3 PSYCHIATRIC:  Normal affect   ASSESSMENT:    1. Coronary artery disease of native artery of native heart with stable angina pectoris ([place])   2. Ischemic cardiomyopathy   3. Chronic systolic CHF (congestive heart failure) ([place])   4. Hyperlipidemia, unspecified hyperlipidemia type   5. OSA (obstructive sleep apnea)    PLAN:    In order of problems listed above:  CAD s/p acute anterior STEMI in July with emergent stenting of the LAD. Troponin > 24,000. Ecg with persistent Q waves. EF 30-35%. On DAPT for one year. Not on beta blocker due to low resting HR. Proceed with cardiac Rehab.  Chronic systolic CHF secondary to ischemic CM. Still trying to optimize medical therapy. Will try and switch losartan to Entresto 24/26 mg bid as long as BP allows. Continue Farxiga and aldactone. Will need to repeat Echo 3-4 months out from MI once on optimal therapy. If EF is still < 35% will need to consider for ICD Hypercholesterolemia. Did not tolerate high dose statin. Will try on Crestor 5 mg daily.  Possible sleep apnea. Will screen with WATCHPAT device.            Medication Adjustments/Labs and Tests Ordered: Current medicines are reviewed at length with the patient today.  Concerns regarding medicines are outlined above.  No orders of the defined types were placed in this encounter.  No orders of the defined types were placed in this encounter.   Patient Instructions  Medication Instructions:  Start Crestor 5 mg daily Stop taking Losartan Start Entresto 24/26 mg twice a day  Continue all other medications *If you need a refill on your cardiac medications before your next appointment, please call your pharmacy*   Lab  Work: None  ordered   Testing/Procedures: None ordered   Follow-Up: At [facility], you and your health needs are our  priority.  As part of our continuing mission to provide you with exceptional heart care, we have created designated Provider Care Teams.  These Care Teams include your primary Cardiologist (physician) and Advanced Practice Providers (APPs -  Physician Assistants and Nurse Practitioners) who all work together to provide you with the care you need, when you need it.  We recommend signing up for the patient portal called "MyChart".  Sign up information is provided on this After Visit Summary.  MyChart is used to connect with patients for Virtual Visits (Telemedicine).  Patients are able to view lab/test results, encounter notes, upcoming appointments, etc.  Non-urgent messages can be sent to your provider as well.   To learn more about what you can do with MyChart, go to [email or web address].    Your next appointment:  1 month    The format for your next appointment: Office    Provider:  Dr.Takera Rayl   Important Information About Sugar         Signed, Elektra Wartman [place], MD  04/21/2022 5:14 PM    [place]

## 2022-04-21 ENCOUNTER — Encounter: Payer: Self-pay | Admitting: Cardiology

## 2022-04-21 ENCOUNTER — Ambulatory Visit: Payer: No Typology Code available for payment source | Attending: Cardiology | Admitting: Cardiology

## 2022-04-21 ENCOUNTER — Encounter (HOSPITAL_COMMUNITY)
Admission: RE | Admit: 2022-04-21 | Discharge: 2022-04-21 | Disposition: A | Discharge status: Home / Self Care | Payer: No Typology Code available for payment source | Source: Ambulatory Visit | Attending: Cardiology | Admitting: Cardiology

## 2022-04-21 VITALS — BP 110/60 | HR 75 | Ht 69.0 in | Wt 155.9 lb

## 2022-04-21 DIAGNOSIS — I25118 Atherosclerotic heart disease of native coronary artery with other forms of angina pectoris: Secondary | ICD-10-CM

## 2022-04-21 DIAGNOSIS — E785 Hyperlipidemia, unspecified: Secondary | ICD-10-CM | POA: Diagnosis not present

## 2022-04-21 DIAGNOSIS — I2102 ST elevation (STEMI) myocardial infarction involving left anterior descending coronary artery: Secondary | ICD-10-CM

## 2022-04-21 DIAGNOSIS — Z955 Presence of coronary angioplasty implant and graft: Secondary | ICD-10-CM

## 2022-04-21 DIAGNOSIS — I5022 Chronic systolic (congestive) heart failure: Secondary | ICD-10-CM

## 2022-04-21 DIAGNOSIS — G4733 Obstructive sleep apnea (adult) (pediatric): Secondary | ICD-10-CM

## 2022-04-21 DIAGNOSIS — I255 Ischemic cardiomyopathy: Secondary | ICD-10-CM

## 2022-04-21 MED ORDER — ROSUVASTATIN CALCIUM 5 MG PO TABS: 5.0000 mg | ORAL_TABLET | Freq: Every day | ORAL | 3 refills | Status: DC

## 2022-04-21 MED ORDER — ENTRESTO 24-26 MG PO TABS: 1.0000 | ORAL_TABLET | Freq: Two times a day (BID) | ORAL | 11 refills | Status: DC

## 2022-04-21 NOTE — Assessment & Plan Note (Signed)
He suffered a STEMI in July of 2023 and has been working  with cardiology and cardiac rehab. If stable.

## 2022-04-21 NOTE — Assessment & Plan Note (Signed)
Encourage heart healthy diet such as MIND or DASH diet, increase exercise, avoid trans fats, simple carbohydrates and processed foods, consider a krill or fish or flaxseed oil cap daily. Developed myalgias and elevate Bili on statins.

## 2022-04-21 NOTE — Patient Instructions (Signed)
Medication Instructions:  Start Crestor 5 mg daily Stop taking Losartan Start Entresto 24/26 mg twice a day  Continue all other medications *If you need a refill on your cardiac medications before your next appointment, please call your pharmacy*   Lab Work: None  ordered   Testing/Procedures: None ordered   Follow-Up: At [facility], you and your health needs are our priority.  As part of our continuing mission to provide you with exceptional heart care, we have created designated Provider Care Teams.  These Care Teams include your primary Cardiologist (physician) and Advanced Practice Providers (APPs -  Physician Assistants and Nurse Practitioners) who all work together to provide you with the care you need, when you need it.  We recommend signing up for the patient portal called "MyChart".  Sign up information is provided on this After Visit Summary.  MyChart is used to connect with patients for Virtual Visits (Telemedicine).  Patients are able to view lab/test results, encounter notes, upcoming appointments, etc.  Non-urgent messages can be sent to your provider as well.   To learn more about what you can do with MyChart, go to [email or web address].    Your next appointment:  1 month    The format for your next appointment: Office    Provider:  Dr.Jordan   Important Information About Sugar

## 2022-04-21 NOTE — Assessment & Plan Note (Addendum)
Patient encouraged to maintain heart healthy diet, regular exercise, adequate sleep. Consider daily probiotics. Take medications as prescribed. Labs reviewed. Cologuard 2021 repeat in 2024 Consider flu shot, covid shot Given second Shingrix shot today Referred for screening colonoscopy

## 2022-04-21 NOTE — Assessment & Plan Note (Signed)
Recent echo after STEMI showed EF of 30-35 % is now on Farxiga, Brilinta and Losartan.

## 2022-04-22 ENCOUNTER — Ambulatory Visit (INDEPENDENT_AMBULATORY_CARE_PROVIDER_SITE_OTHER): Payer: No Typology Code available for payment source | Admitting: Family Medicine

## 2022-04-22 ENCOUNTER — Encounter: Payer: Self-pay | Admitting: Family Medicine

## 2022-04-22 VITALS — BP 100/62 | HR 77 | Temp 97.0°F | Resp 16 | Ht 69.0 in | Wt 155.0 lb

## 2022-04-22 DIAGNOSIS — E785 Hyperlipidemia, unspecified: Secondary | ICD-10-CM | POA: Diagnosis not present

## 2022-04-22 DIAGNOSIS — I2510 Atherosclerotic heart disease of native coronary artery without angina pectoris: Secondary | ICD-10-CM | POA: Diagnosis not present

## 2022-04-22 DIAGNOSIS — R739 Hyperglycemia, unspecified: Secondary | ICD-10-CM | POA: Diagnosis not present

## 2022-04-22 DIAGNOSIS — Z0000 Encounter for general adult medical examination without abnormal findings: Secondary | ICD-10-CM

## 2022-04-22 DIAGNOSIS — I5042 Chronic combined systolic (congestive) and diastolic (congestive) heart failure: Secondary | ICD-10-CM | POA: Diagnosis not present

## 2022-04-22 DIAGNOSIS — M999 Biomechanical lesion, unspecified: Secondary | ICD-10-CM

## 2022-04-22 DIAGNOSIS — R81 Glycosuria: Secondary | ICD-10-CM

## 2022-04-22 DIAGNOSIS — Z23 Encounter for immunization: Secondary | ICD-10-CM

## 2022-04-22 DIAGNOSIS — Z1211 Encounter for screening for malignant neoplasm of colon: Secondary | ICD-10-CM | POA: Diagnosis not present

## 2022-04-22 DIAGNOSIS — R351 Nocturia: Secondary | ICD-10-CM

## 2022-04-22 DIAGNOSIS — L309 Dermatitis, unspecified: Secondary | ICD-10-CM

## 2022-04-22 MED ORDER — TRIAMCINOLONE ACETONIDE 0.1 % EX CREA: 1.0000 | TOPICAL_CREAM | Freq: Two times a day (BID) | CUTANEOUS | 1 refills | Status: DC

## 2022-04-22 NOTE — Patient Instructions (Signed)
Inspire for sleep apnea  Preventive Care 23-57 Years Old, Male Preventive care refers to lifestyle choices and visits with your health care provider that can promote health and wellness. Preventive care visits are also called wellness exams. What can I expect for my preventive care visit? Counseling During your preventive care visit, your health care provider may ask about your: Medical history, including: Past medical problems. Family medical history. Current health, including: Emotional well-being. Home life and relationship well-being. Sexual activity. Lifestyle, including: Alcohol, nicotine or tobacco, and drug use. Access to firearms. Diet, exercise, and sleep habits. Safety issues such as seatbelt and bike helmet use. Sunscreen use. Work and work Statistician. Physical exam Your health care provider will check your: Height and weight. These may be used to calculate your BMI (body mass index). BMI is a measurement that tells if you are at a healthy weight. Waist circumference. This measures the distance around your waistline. This measurement also tells if you are at a healthy weight and may help predict your risk of certain diseases, such as type 2 diabetes and high blood pressure. Heart rate and blood pressure. Body temperature. Skin for abnormal spots. What immunizations do I need?  Vaccines are usually given at various ages, according to a schedule. Your health care provider will recommend vaccines for you based on your age, medical history, and lifestyle or other factors, such as travel or where you work. What tests do I need? Screening Your health care provider may recommend screening tests for certain conditions. This may include: Lipid and cholesterol levels. Diabetes screening. This is done by checking your blood sugar (glucose) after you have not eaten for a while (fasting). Hepatitis B test. Hepatitis C test. HIV (human immunodeficiency virus) test. STI (sexually  transmitted infection) testing, if you are at risk. Lung cancer screening. Prostate cancer screening. Colorectal cancer screening. Talk with your health care provider about your test results, treatment options, and if necessary, the need for more tests. Follow these instructions at home: Eating and drinking  Eat a diet that includes fresh fruits and vegetables, whole grains, lean protein, and low-fat dairy products. Take vitamin and mineral supplements as recommended by your health care provider. Do not drink alcohol if your health care provider tells you not to drink. If you drink alcohol: Limit how much you have to 0-2 drinks a day. Know how much alcohol is in your drink. In the [place] one drink equals one 12 oz bottle of beer (355 mL), one 5 oz glass of wine (148 mL), or one 1 oz glass of hard liquor (44 mL). Lifestyle Brush your teeth every morning and night with fluoride toothpaste. Floss one time each day. Exercise for at least 30 minutes 5 or more days each week. Do not use any products that contain nicotine or tobacco. These products include cigarettes, chewing tobacco, and vaping devices, such as e-cigarettes. If you need help quitting, ask your health care provider. Do not use drugs. If you are sexually active, practice safe sex. Use a condom or other form of protection to prevent STIs. Take aspirin only as told by your health care provider. Make sure that you understand how much to take and what form to take. Work with your health care provider to find out whether it is safe and beneficial for you to take aspirin daily. Find healthy ways to manage stress, such as: Meditation, yoga, or listening to music. Journaling. Talking to a trusted person. Spending time with friends and family. Minimize exposure  to UV radiation to reduce your risk of skin cancer. Safety Always wear your seat belt while driving or riding in a vehicle. Do not drive: If you have been drinking alcohol. Do  not ride with someone who has been drinking. When you are tired or distracted. While texting. If you have been using any mind-altering substances or drugs. Wear a helmet and other protective equipment during sports activities. If you have firearms in your house, make sure you follow all gun safety procedures. What's next? Go to your health care provider once a year for an annual wellness visit. Ask your health care provider how often you should have your eyes and teeth checked. Stay up to date on all vaccines. This information is not intended to replace advice given to you by your health care provider. Make sure you discuss any questions you have with your health care provider. Document Revised: 12/10/2020 Document Reviewed: 12/10/2020 Elsevier Patient Education  [place].

## 2022-04-22 NOTE — Progress Notes (Addendum)
Subjective:   By signing my name below, I, Michael Gibbs, attest that this documentation has been prepared under the direction and in the presence of Mosie Lukes, MD., 04/22/2022.   [name], male    DOB: 14-Mar-1966, 56 y.o.   MRN: [id number]  Chief Complaint  Patient presents with   Annual Exam    Here for Annual Exam   HPI Patient is in today for a comprehensive physical exam and follow up on chronic medical concerns.   He denies having any fever, chills, ear pain, headaches, muscle pain, joint pain, new moles, rash, itching, congestion, sinus pain, sore throat, chest pain, palpitations, wheezing, nausea, vomitting, abdominal pain, diarrhea, constipation, blood in stool, dysuria, urgency, frequency and hematuria.  Cardiology: He saw his cardiologist, Dr. Peter Martinique, on 04/21/2022 to follow up on the condition of his coronary artery disease.   He reports that he has been experiencing episodes of chest discomfort and was admitted to the ER on 01/20/2022 for an ST elevation myocardial infarction. He also visited the ER on 04/12/2022 for a chief complaint of chest discomfort. He states that he was taking Atorvastatin to manage his cholesterol, but this caused him to experience myalgias. He has now been prescribed Rosuvastatin by which he will start taking today.  Cough: He states that he had a cough last week but is feeling okay today. He tested negative for COVID-19.  Dental: He is up to date on dental care.  Dermatology: He is interested in receiving a dermatology referral to manage intermittent dermatitis on his right ear and naval region.   Diet: He attempts to maintain a well-balanced diet.   Family history: He reports that three of his maternal uncles had heart disease. His mother is 94 years old and has diabetes and cardiac complications. His father is 55 years old and hyponatremic.  Immunizations: He has been informed about receiving COVID-19 and  Flu immunizations. He will receive the second Shingles immunization today.   Urology: He is inquiring about the state of his hyperglycemia. He confirms having nocturia at night.   Refills: He is requesting a refill on Triamcinolone 0.1% ointment.   Sleep Apnea: He reports that his cardiologist has ordered a sleep apnea study. He states that he used to use a CPAP at night, but not anymore. He further states that he snores and occasionally awakes with headaches.  Past Medical History:  Diagnosis Date   Allergic state 04/25/2007   Seasonal allergies-well controlled with Allegra and Flonase     CAD (coronary artery disease)    a. 12/2021: s/p STEMI with DES to proximal LAD   Dermatitis 09/22/2011   Headache(784.0)    Headache(784.0) 09/22/2011   HFrEF (heart failure with reduced ejection fraction) ([facility])    a. EF 30-35% in 12/2021 in the setting of anterior STEMI   Hyperlipidemia, mixed 08/11/2014   Preventative health care 04/02/2011   Sleep apnea    Sleep apnea    Past Surgical History:  Procedure Laterality Date   CARDIAC CATHETERIZATION     CORONARY/GRAFT ACUTE MI REVASCULARIZATION N/A 01/20/2022   Procedure: Coronary/Graft Acute MI Revascularization;  Surgeon: Martinique, Peter M, MD;  Location: [facility];  Service: Cardiovascular;  Laterality: N/A;   LEFT HEART CATH AND CORONARY ANGIOGRAPHY N/A 01/20/2022   Procedure: LEFT HEART CATH AND CORONARY ANGIOGRAPHY;  Surgeon: Martinique, Peter M, MD;  Location: [facility];  Service: Cardiovascular;  Laterality: N/A;   Family History  Problem Relation Age of  Onset   Heart disease Mother    Diabetes Mother        diet controlled DM   Other Father        bowel obstruction with resection, hyponatremia   Ulcers Father    Depression Brother    Cancer Paternal Grandmother        esophageal cancer   GER disease Paternal Grandmother    Social History   Socioeconomic History   Marital status: Married    Spouse name: Not on  file   Number of children: 2   Years of education: 16   Highest education level: Master's degree (e.g., MA, MS, MEng, MEd, MSW, MBA)  Occupational History   Not on file  Tobacco Use   Smoking status: Never   Smokeless tobacco: Never  Vaping Use   Vaping Use: Not on file  Substance and Sexual Activity   Alcohol use: Yes    Comment: rare beer   Drug use: No   Sexual activity: Yes    Comment: lives with wife,  works in Engineer, technical sales, vegetarian, wears a seat baelt  Other Topics Concern   Not on file  Social History Narrative   Patient is from [place] but lives in [place], has been in the [place] since 1996, lives with wife and 2 children-ages 75 and 74, wife works from home, her office is in [place]. Remainder family lives in [place]. Patient works in Engineer, technical sales. Enjoys tennis. Vegetarian.   Social Determinants of Health   Financial Resource Strain: Not on file  Food Insecurity: Not on file  Transportation Needs: Not on file  Physical Activity: Not on file  Stress: Not on file  Social Connections: Not on file  Intimate Partner Violence: Not on file   Outpatient Medications Prior to Visit  Medication Sig Dispense Refill   aspirin EC 81 MG tablet Take 1 tablet (81 mg total) by mouth daily. Swallow whole. 30 tablet 12   dapagliflozin propanediol (FARXIGA) 10 MG TABS tablet Take 1 tablet (10 mg total) by mouth daily. 30 tablet 5   nitroGLYCERIN (NITROSTAT) 0.4 MG SL tablet Place 1 tablet (0.4 mg total) under the tongue every 5 (five) minutes x 3 doses as needed for chest pain. 25 tablet 2   rosuvastatin (CRESTOR) 5 MG tablet Take 1 tablet (5 mg total) by mouth daily. 90 tablet 3   sacubitril-valsartan (ENTRESTO) 24-26 MG Take 1 tablet by mouth 2 (two) times daily. 60 tablet 11   spironolactone (ALDACTONE) 25 MG tablet Take 1/2 tablet ( 12.5 mg ) daily 45 tablet 3   ticagrelor (BRILINTA) 90 MG TABS tablet Take 1 tablet (90 mg total) by mouth 2 (two) times daily. 60 tablet 11   No  facility-administered medications prior to visit.   Allergies  Allergen Reactions   Lipitor [Atorvastatin] Other (See Comments)    Review of Systems  Constitutional:  Negative for chills and fever.  HENT:  Negative for congestion, ear pain, sinus pain and sore throat.   Respiratory:  Negative for cough, shortness of breath and wheezing.   Cardiovascular:  Negative for chest pain and palpitations.  Gastrointestinal:  Negative for abdominal pain, blood in stool, constipation, diarrhea, nausea and vomiting.  Genitourinary:  Negative for dysuria, frequency, hematuria and urgency.  Musculoskeletal:  Negative for joint pain and myalgias.  Skin:  Negative for itching and rash.       (-) New moles.  Neurological:  Negative for headaches.      Objective:  Physical Exam Constitutional:      General: He is not in acute distress.    Appearance: Normal appearance. He is not ill-appearing.  HENT:     Head: Normocephalic and atraumatic.     Right Ear: Tympanic membrane, ear canal and external ear normal.     Left Ear: Tympanic membrane, ear canal and external ear normal.     Ears:     Comments: Dermatitis on right ear.     Mouth/Throat:     Mouth: Mucous membranes are moist.     Pharynx: Oropharynx is clear.  Eyes:     Extraocular Movements: Extraocular movements intact.     Right eye: No nystagmus.     Left eye: No nystagmus.     Pupils: Pupils are equal, round, and reactive to light.  Neck:     Vascular: No carotid bruit.  Cardiovascular:     Rate and Rhythm: Normal rate and regular rhythm.     Pulses: Normal pulses.     Heart sounds: Normal heart sounds. No murmur heard.    No gallop.  Pulmonary:     Effort: Pulmonary effort is normal. No respiratory distress.     Breath sounds: Normal breath sounds. No wheezing or rales.  Abdominal:     General: Bowel sounds are normal.     Tenderness: There is no abdominal tenderness.  Musculoskeletal:     Comments: Muscle strength 5/5  on upper and lower extremities.   Lymphadenopathy:     Cervical: No cervical adenopathy.  Skin:    General: Skin is warm and dry.  Neurological:     Mental Status: He is alert and oriented to person, place, and time.     Sensory: Sensation is intact.     Motor: Motor function is intact.     Coordination: Coordination is intact.     Deep Tendon Reflexes:     Reflex Scores:      Patellar reflexes are 2+ on the right side and 2+ on the left side. Psychiatric:        Mood and Affect: Mood normal.        Behavior: Behavior normal.        Judgment: Judgment normal.    BP 100/62 (BP Location: Right Arm, Patient Position: Sitting, Cuff Size: Normal)   Pulse 77   Temp (!) 97 F (36.1 C) (Oral)   Resp 16   Ht $R'5\' 9"'Jq$  (1.753 m)   Wt 155 lb (70.3 kg)   SpO2 99%   BMI 22.89 kg/m  Wt Readings from Last 3 Encounters:  04/22/22 155 lb (70.3 kg)  04/21/22 155 lb 14.4 oz (70.7 kg)  04/12/22 154 lb (69.9 kg)   Diabetic Foot Exam - Simple   No data filed    Lab Results  Component Value Date   WBC 5.8 04/12/2022   HGB 14.3 04/12/2022   HCT 43.8 04/12/2022   PLT 233 04/12/2022   GLUCOSE 73 04/14/2022   CHOL 99 (L) 02/22/2022   TRIG 82 02/22/2022   HDL 33 (L) 02/22/2022   LDLDIRECT 110 (H) 04/25/2007   LDLCALC 49 02/22/2022   ALT 14 04/14/2022   AST 16 04/14/2022   NA 141 04/14/2022   K 4.7 04/14/2022   CL 101 04/14/2022   CREATININE 0.81 04/14/2022   BUN 12 04/14/2022   CO2 26 04/14/2022   TSH 1.993 01/21/2022   PSA 0.50 04/16/2021   INR 1.1 01/20/2022   HGBA1C 5.6 03/05/2022   Lab  Results  Component Value Date   TSH 1.993 01/21/2022   Lab Results  Component Value Date   WBC 5.8 04/12/2022   HGB 14.3 04/12/2022   HCT 43.8 04/12/2022   MCV 88.8 04/12/2022   PLT 233 04/12/2022   Lab Results  Component Value Date   NA 141 04/14/2022   K 4.7 04/14/2022   CO2 26 04/14/2022   GLUCOSE 73 04/14/2022   BUN 12 04/14/2022   CREATININE 0.81 04/14/2022   BILITOT 1.4 (H)  04/14/2022   ALKPHOS 103 04/14/2022   AST 16 04/14/2022   ALT 14 04/14/2022   PROT 7.1 04/14/2022   ALBUMIN 4.8 04/14/2022   CALCIUM 10.0 04/14/2022   ANIONGAP 9 04/12/2022   EGFR 103 04/14/2022   GFR 98.04 03/31/2022   Lab Results  Component Value Date   CHOL 99 (L) 02/22/2022   Lab Results  Component Value Date   HDL 33 (L) 02/22/2022   Lab Results  Component Value Date   LDLCALC 49 02/22/2022   Lab Results  Component Value Date   TRIG 82 02/22/2022   Lab Results  Component Value Date   CHOLHDL 3.0 02/22/2022   Lab Results  Component Value Date   HGBA1C 5.6 03/05/2022   Colonoscopy: He has not yet completed a colonoscopy.   PSA: Last completed on 04/16/2021. 0.50 ng/mL. Repeat in 2 years.    Assessment & Plan:   Problem List Items Addressed This Visit     Preventative health care - Primary    Patient encouraged to maintain heart healthy diet, regular exercise, adequate sleep. Consider daily probiotics. Take medications as prescribed. Labs reviewed. Cologuard 2021 repeat in 2024 Consider flu shot, covid shot Given second Shingrix shot today Referred for screening colonoscopy      Dermatitis    Given refill on Triamcinolone and referred to dermatology for further consideration. Rashes are sporadic and scattered. Most notable behind right ear      Relevant Orders   Ambulatory referral to Dermatology   HLD (hyperlipidemia)    Encourage heart healthy diet such as MIND or DASH diet, increase exercise, avoid trans fats, simple carbohydrates and processed foods, consider a krill or fish or flaxseed oil cap daily. Developed myalgias and elevate Bili on statins.       Relevant Orders   Comprehensive metabolic panel   Nonallopathic lesion of cervical region   Coronary artery disease involving native coronary artery of native heart    He suffered a STEMI in July of 2023 and has been working  with cardiology and cardiac rehab. If stable.      Chronic combined  systolic (congestive) and diastolic (congestive) heart failure (HCC)    Recent echo after STEMI showed EF of 30-35 % is now on Farxiga, Brilinta and Losartan.       Glucosuria    Minimize simple carbs and monitor      Relevant Orders   Urinalysis   Hyperglycemia    hgba1c acceptable, minimize simple carbs. Increase exercise as tolerated.       Relevant Orders   Hemoglobin A1c   Comprehensive metabolic panel   Other Visit Diagnoses     Nocturia       Relevant Orders   PSA   Colon cancer screening       Relevant Orders   Ambulatory referral to Gastroenterology   Need for shingles vaccine       Relevant Orders   Zoster Recombinant (Shingrix ) (Completed)  Meds ordered this encounter  Medications   triamcinolone cream (KENALOG) 0.1 %    Sig: Apply 1 Application topically 2 (two) times daily.    Dispense:  30 g    Refill:  1   I, Penni Homans, MD, personally preformed the services described in this documentation.  All medical record entries made by the scribe were at my direction and in my presence.  I have reviewed the chart and discharge instructions (if applicable) and agree that the record reflects my personal performance and is accurate and complete. 04/22/2022  I,Mohammed Iqbal,acting as a scribe for Penni Homans, MD.,have documented all relevant documentation on the behalf of Penni Homans, MD,as directed by  Penni Homans, MD while in the presence of Penni Homans, MD.  Penni Homans, MD

## 2022-04-23 ENCOUNTER — Encounter (HOSPITAL_COMMUNITY): Payer: No Typology Code available for payment source

## 2022-04-24 DIAGNOSIS — R739 Hyperglycemia, unspecified: Secondary | ICD-10-CM | POA: Insufficient documentation

## 2022-04-24 DIAGNOSIS — R81 Glycosuria: Secondary | ICD-10-CM | POA: Insufficient documentation

## 2022-04-24 NOTE — Assessment & Plan Note (Signed)
hgba1c acceptable, minimize simple carbs. Increase exercise as tolerated.  

## 2022-04-24 NOTE — Assessment & Plan Note (Signed)
Minimize simple carbs and monitor

## 2022-04-24 NOTE — Assessment & Plan Note (Signed)
Given refill on Triamcinolone and referred to dermatology for further consideration. Rashes are sporadic and scattered. Most notable behind right ear

## 2022-04-28 ENCOUNTER — Encounter (HOSPITAL_COMMUNITY)
Admission: RE | Admit: 2022-04-28 | Discharge: 2022-04-28 | Disposition: A | Discharge status: Home / Self Care | Payer: No Typology Code available for payment source | Source: Ambulatory Visit | Attending: Cardiology | Admitting: Cardiology

## 2022-04-28 DIAGNOSIS — I252 Old myocardial infarction: Secondary | ICD-10-CM | POA: Diagnosis not present

## 2022-04-28 DIAGNOSIS — Z955 Presence of coronary angioplasty implant and graft: Secondary | ICD-10-CM | POA: Diagnosis not present

## 2022-04-28 DIAGNOSIS — Z5189 Encounter for other specified aftercare: Secondary | ICD-10-CM | POA: Diagnosis not present

## 2022-04-28 DIAGNOSIS — I2102 ST elevation (STEMI) myocardial infarction involving left anterior descending coronary artery: Secondary | ICD-10-CM

## 2022-04-30 ENCOUNTER — Encounter: Payer: Self-pay | Admitting: Physician Assistant

## 2022-04-30 ENCOUNTER — Telehealth: Payer: Self-pay | Admitting: Cardiology

## 2022-04-30 ENCOUNTER — Encounter (HOSPITAL_COMMUNITY)
Admission: RE | Admit: 2022-04-30 | Discharge: 2022-04-30 | Disposition: A | Discharge status: Home / Self Care | Payer: No Typology Code available for payment source | Source: Ambulatory Visit | Attending: Cardiology | Admitting: Cardiology

## 2022-04-30 DIAGNOSIS — I2102 ST elevation (STEMI) myocardial infarction involving left anterior descending coronary artery: Secondary | ICD-10-CM

## 2022-04-30 DIAGNOSIS — Z5189 Encounter for other specified aftercare: Secondary | ICD-10-CM | POA: Diagnosis not present

## 2022-04-30 DIAGNOSIS — Z955 Presence of coronary angioplasty implant and graft: Secondary | ICD-10-CM

## 2022-04-30 NOTE — Telephone Encounter (Signed)
Error

## 2022-04-30 NOTE — Telephone Encounter (Signed)
Patient states someone named Brandy from Dr. Doug Sou office contacted him regarding a sleep apnea monitor/watch, advising that she would call him back when she got the pin #, but he never received a call back. He states is following up for the pin #.

## 2022-04-30 NOTE — Telephone Encounter (Signed)
Spoke with patient advised Michael Gibbs is in clinic this morning.I will send message to her.

## 2022-05-01 ENCOUNTER — Encounter (HOSPITAL_BASED_OUTPATIENT_CLINIC_OR_DEPARTMENT_OTHER): Payer: No Typology Code available for payment source | Admitting: Cardiology

## 2022-05-01 DIAGNOSIS — G4733 Obstructive sleep apnea (adult) (pediatric): Secondary | ICD-10-CM

## 2022-05-03 ENCOUNTER — Ambulatory Visit: Payer: No Typology Code available for payment source | Admitting: Cardiology

## 2022-05-03 ENCOUNTER — Ambulatory Visit: Payer: No Typology Code available for payment source | Attending: Nurse Practitioner

## 2022-05-03 DIAGNOSIS — G4733 Obstructive sleep apnea (adult) (pediatric): Secondary | ICD-10-CM

## 2022-05-03 NOTE — Procedures (Signed)
         SLEEP STUDY REPORT Patient Information Study Date: 05/01/22 Patient Name: Michael Gibbs Patient ID: [id number] Birth Date: December 25, 2065 Age: 56 Gender: Male BMI: 22.9 (W=154 lb, H=5' 9'') Referring Physician: Peter Martinique, MD  TEST DESCRIPTION: Home sleep apnea testing was completed using the WatchPat, a Type 1 device, utilizing peripheral arterial tonometry (PAT), chest movement, actigraphy, pulse oximetry, pulse rate, body position and snore. AHI was calculated with apnea and hypopnea using valid sleep time as the denominator. RDI includes apneas, hypopneas, and RERAs. The data acquired and the scoring of sleep and all associated events were performed in accordance with the recommended standards and specifications as outlined in the AASM Manual for the Scoring of Sleep and Associated Events 2.2.0 (2015).   FINDINGS:   1. Mild Obstructive Sleep Apnea with AHI 5.1hr that only occurs when sleeping supine position.  2. No Central Sleep Apnea with pAHIc 0/hr.   3. Oxygen desaturations as low as 88%.   4. Mild snoring was present. O2 sats were < 88% for 0.3 min.   5. Total sleep time was 4 hrs and 52  min.   6. 17.2% of total sleep time was spent in REM sleep.   7. Shortened sleep onset latency at 6 min.   8. Shortened REM sleep onset latency at 61 min.   9. Total awakenings were11 .  10. Arrhythmia detection:  None.  DIAGNOSIS: Very Mild Obstructive Sleep Apnea (G47.33)  RECOMMENDATIONS:   1.  Clinical correlation of these findings is necessary.  The decision to treat obstructive sleep apnea (OSA) is usually based on the presence of apnea symptoms or the presence of associated medical conditions such as Hypertension, Congestive Heart Failure, Atrial Fibrillation or Obesity.  The most common symptoms of OSA are snoring, gasping for breath while sleeping, daytime sleepiness and fatigue.   2. Patient has minimal OSA only when sleeping supine and therefore recommend that  he avoid sleeping supine with not other therapy needed    4.  Healthy sleep recommendations include:  adequate nightly sleep (normal 7-9 hrs/night), avoidance of caffeine after noon and alcohol near bedtime, and maintaining a sleep environment that is cool, dark and quiet.  5.  Weight loss for overweight patients is recommended.  Even modest amounts of weight loss can significantly improve the severity of sleep apnea.  6.  Snoring recommendations include:  weight loss where appropriate, side sleeping, and avoidance of alcohol before bed.  7.  Operation of motor vehicle should be avoided when sleepy.  Signature:   Fransico Him, MD; [facility]; [place], [place] Board of Sleep Medicine Electronically Signed: 05/03/22

## 2022-05-05 ENCOUNTER — Encounter (HOSPITAL_COMMUNITY): Payer: No Typology Code available for payment source

## 2022-05-05 ENCOUNTER — Encounter (HOSPITAL_COMMUNITY)
Admission: RE | Admit: 2022-05-05 | Discharge: 2022-05-05 | Disposition: A | Discharge status: Home / Self Care | Payer: No Typology Code available for payment source | Source: Ambulatory Visit | Attending: Cardiology | Admitting: Cardiology

## 2022-05-05 ENCOUNTER — Telehealth: Payer: Self-pay | Admitting: *Deleted

## 2022-05-05 DIAGNOSIS — Z955 Presence of coronary angioplasty implant and graft: Secondary | ICD-10-CM

## 2022-05-05 DIAGNOSIS — I2102 ST elevation (STEMI) myocardial infarction involving left anterior descending coronary artery: Secondary | ICD-10-CM

## 2022-05-05 DIAGNOSIS — Z5189 Encounter for other specified aftercare: Secondary | ICD-10-CM | POA: Diagnosis not present

## 2022-05-05 NOTE — Telephone Encounter (Signed)
Left message to return a call to discuss sleep study results. 

## 2022-05-05 NOTE — Progress Notes (Signed)
CARDIAC REHAB PHASE 2  Reviewed home exercise with pt today. Pt is tolerating exercise well. Pt will continue to exercise on his own by walking and doing a yoga class for 30-45 minutes per session 5 days a week in addition to the 2 days in CRP2. Advised pt on THRR, RPE scale, hydration and temperature/humidity precautions. Reinforced NTG use, S/S to stop exercise and when to call MD vs 911. Encouraged warm up cool down and stretches with exercise sessions. Pt verbalized understanding, all questions were answered and pt was given a copy to take home.    Harrie Jeans ACSM-CEP 05/05/2022 4:35 PM

## 2022-05-05 NOTE — Progress Notes (Signed)
Cardiac Individual Treatment Plan  Patient Details  Name: Michael Gibbs MRN: [id number] Date of Birth: 1966-01-13 Referring Provider:   Flowsheet Row INTENSIVE CARDIAC REHAB ORIENT from 03/25/2022 in [place]  Referring Provider Dr. Peter Martinique MD       Initial Encounter Date:  [place] from 03/25/2022 in [place]  Date 03/25/22       Visit Diagnosis: 01/20/22 STEMI  01/20/22 S/P DES Prox LAD  Patient's Home Medications on Admission:  Current Outpatient Medications:    aspirin EC 81 MG tablet, Take 1 tablet (81 mg total) by mouth daily. Swallow whole., Disp: 30 tablet, Rfl: 12   dapagliflozin propanediol (FARXIGA) 10 MG TABS tablet, Take 1 tablet (10 mg total) by mouth daily., Disp: 30 tablet, Rfl: 5   nitroGLYCERIN (NITROSTAT) 0.4 MG SL tablet, Place 1 tablet (0.4 mg total) under the tongue every 5 (five) minutes x 3 doses as needed for chest pain., Disp: 25 tablet, Rfl: 2   rosuvastatin (CRESTOR) 5 MG tablet, Take 1 tablet (5 mg total) by mouth daily., Disp: 90 tablet, Rfl: 3   sacubitril-valsartan (ENTRESTO) 24-26 MG, Take 1 tablet by mouth 2 (two) times daily., Disp: 60 tablet, Rfl: 11   spironolactone (ALDACTONE) 25 MG tablet, Take 1/2 tablet ( 12.5 mg ) daily, Disp: 45 tablet, Rfl: 3   ticagrelor (BRILINTA) 90 MG TABS tablet, Take 1 tablet (90 mg total) by mouth 2 (two) times daily., Disp: 60 tablet, Rfl: 11   triamcinolone cream (KENALOG) 0.1 %, Apply 1 Application topically 2 (two) times daily., Disp: 30 g, Rfl: 1  Past Medical History: Past Medical History:  Diagnosis Date   Allergic state 04/25/2007   Seasonal allergies-well controlled with Allegra and Flonase     CAD (coronary artery disease)    a. 12/2021: s/p STEMI with DES to proximal LAD   Dermatitis 09/22/2011   Headache(784.0)    Headache(784.0) 09/22/2011   HFrEF (heart failure with reduced  ejection fraction) ([place])    a. EF 30-35% in 12/2021 in the setting of anterior STEMI   Hyperlipidemia, mixed 08/11/2014   Preventative health care 04/02/2011   Sleep apnea    Sleep apnea     Tobacco Use: Social History   Tobacco Use  Smoking Status Never  Smokeless Tobacco Never    Labs: Review Flowsheet  More data exists      Latest Ref Rng & Units 04/15/2020 04/16/2021 01/20/2022 02/22/2022 03/05/2022  Labs for ITP Cardiac and Pulmonary Rehab  Cholestrol 100 - 199 mg/dL 197  191  218  99  -  LDL (calc) 0 - 99 mg/dL 132  127  145  49  -  HDL-C >39 mg/dL 38  42.70  46  33  -  Trlycerides 0 - 149 mg/dL 156  105.0  134  82  -  Hemoglobin A1c 4.6 - 6.5 % - 5.4  5.2  - 5.6   TCO2 22 - 32 mmol/L - - 20  - -    Capillary Blood Glucose: No results found for: "GLUCAP"   Exercise Target Goals: Exercise Program Goal: Individual exercise prescription set using results from initial 6 min walk test and THRR while considering  patient's activity barriers and safety.   Exercise Prescription Goal: Initial exercise prescription builds to 30-45 minutes a day of aerobic activity, 2-3 days per week.  Home exercise guidelines will be given to patient during program as part  of exercise prescription that the participant will acknowledge.  Activity Barriers & Risk Stratification:  Activity Barriers & Cardiac Risk Stratification - 03/25/22 1443       Activity Barriers & Cardiac Risk Stratification   Activity Barriers Back Problems;Neck/Spine Problems    Cardiac Risk Stratification High   under 5 mets on 6MWT            6 Minute Walk:  6 Minute Walk     Row Name 03/25/22 1439         6 Minute Walk   Phase Initial     Distance 1800 feet     Walk Time 6 minutes     # of Rest Breaks 0     MPH 3.41     METS 4.66     RPE 9     Perceived Dyspnea  0     VO2 Peak 16.31     Symptoms No     Resting HR 72 bpm     Resting BP 98/60     Resting Oxygen Saturation  100 %     Exercise  Oxygen Saturation  during 6 min walk 100 %     Max Ex. HR 107 bpm     Max Ex. BP 110/72     2 Minute Post BP 110/68              Oxygen Initial Assessment:   Oxygen Re-Evaluation:   Oxygen Discharge (Final Oxygen Re-Evaluation):   Initial Exercise Prescription:  Initial Exercise Prescription - 03/25/22 1400       Date of Initial Exercise RX and Referring Provider   Date 03/25/22    Referring Provider Dr. Peter Martinique MD    Expected Discharge Date 05/28/22      Bike   Level 2.5    Minutes 15    METs 2.2      NuStep   Level 3    SPM 70    Minutes 15    METs 2.2      Prescription Details   Frequency (times per week) 3    Duration Progress to 30 minutes of continuous aerobic without signs/symptoms of physical distress      Intensity   THRR 40-80% of Max Heartrate 65-131    Ratings of Perceived Exertion 11-13    Perceived Dyspnea 0-4      Progression   Progression Continue progressive overload as per policy without signs/symptoms or physical distress.      Resistance Training   Training Prescription Yes    Weight 4    Reps 10-15             Perform Capillary Blood Glucose checks as needed.  Exercise Prescription Changes:   Exercise Prescription Changes     Row Name 04/02/22 1623             Response to Exercise   Blood Pressure (Admit) 94/72       Blood Pressure (Exercise) 114/64       Blood Pressure (Exit) 110/56       Heart Rate (Admit) 77 bpm       Heart Rate (Exercise) 116 bpm       Heart Rate (Exit) 86 bpm       Rating of Perceived Exertion (Exercise) 9       Perceived Dyspnea (Exercise) 0       Symptoms 0       Comments Pt first day CRP2 program  Duration Progress to 30 minutes of  aerobic without signs/symptoms of physical distress       Intensity THRR unchanged         Progression   Progression Continue to progress workloads to maintain intensity without signs/symptoms of physical distress.       Average METs 3.85          Resistance Training   Training Prescription Yes       Weight 4       Reps 10-15       Time 10 Minutes         Bike   Level 2.5       Minutes 15       METs 5.3         NuStep   Level 3       SPM 85       Minutes 15       METs 2.4                Exercise Comments:   Exercise Comments     Row Name 04/02/22 1630 05/05/22 0828         Exercise Comments Pt first day in the CRP2 program. Pt tolerated Exercise well with an average MET level of 3.85. Pt is learning his THRR, RPE and ExRx. Will review eduation with pt if he attends today. Pt was seen in ER for chest pain recently, waited for a few sessions to insure no recurring angina syptoms before prescribing home exercise guidlines.               Exercise Goals and Review:   Exercise Goals     Row Name 03/25/22 1446             Exercise Goals   Increase Physical Activity Yes       Intervention Provide advice, education, support and counseling about physical activity/exercise needs.;Develop an individualized exercise prescription for aerobic and resistive training based on initial evaluation findings, risk stratification, comorbidities and participant's personal goals.       Expected Outcomes Short Term: Attend rehab on a regular basis to increase amount of physical activity.;Long Term: Add in home exercise to make exercise part of routine and to increase amount of physical activity.;Long Term: Exercising regularly at least 3-5 days a week.       Increase Strength and Stamina Yes       Intervention Provide advice, education, support and counseling about physical activity/exercise needs.;Develop an individualized exercise prescription for aerobic and resistive training based on initial evaluation findings, risk stratification, comorbidities and participant's personal goals.       Expected Outcomes Short Term: Increase workloads from initial exercise prescription for resistance, speed, and METs.;Short Term: Perform  resistance training exercises routinely during rehab and add in resistance training at home;Long Term: Improve cardiorespiratory fitness, muscular endurance and strength as measured by increased METs and functional capacity (6MWT)       Able to understand and use rate of perceived exertion (RPE) scale Yes       Intervention Provide education and explanation on how to use RPE scale       Expected Outcomes Short Term: Able to use RPE daily in rehab to express subjective intensity level;Long Term:  Able to use RPE to guide intensity level when exercising independently       Knowledge and understanding of Target Heart Rate Range (THRR) Yes       Intervention Provide education and explanation  of THRR including how the numbers were predicted and where they are located for reference       Expected Outcomes Short Term: Able to state/look up THRR;Long Term: Able to use THRR to govern intensity when exercising independently;Short Term: Able to use daily as guideline for intensity in rehab       Understanding of Exercise Prescription Yes       Intervention Provide education, explanation, and written materials on patient's individual exercise prescription       Expected Outcomes Short Term: Able to explain program exercise prescription;Long Term: Able to explain home exercise prescription to exercise independently                Exercise Goals Re-Evaluation :  Exercise Goals Re-Evaluation     Row Name 04/02/22 1628             Exercise Goal Re-Evaluation   Exercise Goals Review Increase Physical Activity;Increase Strength and Stamina;Able to understand and use rate of perceived exertion (RPE) scale;Knowledge and understanding of Target Heart Rate Range (THRR);Understanding of Exercise Prescription       Comments Pt first day in the CRP2 program. Pt tolerated Exercise well with an average MET level of 3.85. Pt is learning his THRR, RPE and ExRx.       Expected Outcomes Will continue to monitor pt and  progress workloads as tolerated without sign or symptom                Discharge Exercise Prescription (Final Exercise Prescription Changes):  Exercise Prescription Changes - 04/02/22 1623       Response to Exercise   Blood Pressure (Admit) 94/72    Blood Pressure (Exercise) 114/64    Blood Pressure (Exit) 110/56    Heart Rate (Admit) 77 bpm    Heart Rate (Exercise) 116 bpm    Heart Rate (Exit) 86 bpm    Rating of Perceived Exertion (Exercise) 9    Perceived Dyspnea (Exercise) 0    Symptoms 0    Comments Pt first day CRP2 program    Duration Progress to 30 minutes of  aerobic without signs/symptoms of physical distress    Intensity THRR unchanged      Progression   Progression Continue to progress workloads to maintain intensity without signs/symptoms of physical distress.    Average METs 3.85      Resistance Training   Training Prescription Yes    Weight 4    Reps 10-15    Time 10 Minutes      Bike   Level 2.5    Minutes 15    METs 5.3      NuStep   Level 3    SPM 85    Minutes 15    METs 2.4             Nutrition:  Target Goals: Understanding of nutrition guidelines, daily intake of sodium <1544m, cholesterol <2022m calories 30% from fat and 7% or less from saturated fats, daily to have 5 or more servings of fruits and vegetables.  Biometrics:  Pre Biometrics - 03/25/22 1438       Pre Biometrics   Waist Circumference 38.5 inches    Hip Circumference 39 inches    Waist to Hip Ratio 0.99 %    Triceps Skinfold 11 mm    % Body Fat 23.6 %    Grip Strength 12.5 kg    Flexibility 38 in    Single Leg Stand 30 seconds  Nutrition Therapy Plan and Nutrition Goals:  Nutrition Therapy & Goals - 05/03/22 1556       Nutrition Therapy   Diet Heart Healthy Diet      Personal Nutrition Goals   Nutrition Goal Patient to identify strategies for managing cardiovascular risk by attending the weekly P[facility]in and nutrition courses     Personal Goal #2 Patient to limit sodium intake to <1558m per day    Personal Goal #3 Patient to identify and limit daily intake of saturated fat, trans fat, sodium, and refined carbohydrates    Comments Goals in action. [name] continues to attend the [employer] nutrition series. He has implemented many dietary changes including mindfulness of saturated fat intake and continues vegetarian diet. His wife remains very supportive of lifestyle changes. His liver enzymes are WNL and he was recently started on crestor. He is down 5.3# since starting with our program.      Intervention Plan   Intervention Prescribe, educate and counsel regarding individualized specific dietary modifications aiming towards targeted core components such as weight, hypertension, lipid management, diabetes, heart failure and other comorbidities.;Nutrition handout(s) given to patient.    Expected Outcomes Short Term Goal: Understand basic principles of dietary content, such as calories, fat, sodium, cholesterol and nutrients.;Long Term Goal: Adherence to prescribed nutrition plan.             Nutrition Assessments:  Nutrition Assessments - 04/05/22 1213       Rate Your Plate Scores   Pre Score 63            MEDIFICTS Score Key: ?70 Need to make dietary changes  40-70 Heart Healthy Diet ? 40 Therapeutic Level Cholesterol Diet   Flowsheet Row INTENSIVE CARDIAC REHAB from 04/02/2022 in [facility] Picture Your Plate Total Score on Admission 63      Picture Your Plate Scores: <<68Unhealthy dietary pattern with much room for improvement. 41-50 Dietary pattern unlikely to meet recommendations for good health and room for improvement. 51-60 More healthful dietary pattern, with some room for improvement.  >60 Healthy dietary pattern, although there may be some specific behaviors that could be improved.    Nutrition Goals Re-Evaluation:  Nutrition Goals Re-Evaluation      [place] 04/02/22 1620 05/03/22 1556           Goals   Current Weight 163 lb 9.3 oz (74.2 kg) 152 lb 1.9 oz (69 kg)  Weight from last attended session on 04/30/2022      Comment Cholesterol 99, HDL 33, A1c WNL, AST 50, ALT 61; Atorvastatin was stopped due to elevated due liver enzymes. No new labs available at this time; liver enzymes WNL      Expected Outcome Goals in action. [name] lives at home with his wife and children. He works from home. Both he and his wife do the grocery shopping and cooking. They follow a vegetarian lifestyle; he does enjoy eggs and dairy. They have switched from peanut oil to olive oil in cooking/frying. He also reports some boredom snacking especially in the evenings. Goals in action. [name] continues to attend the [employer] nutrition series. He has implemented many dietary changes including mindfulness of saturated fat intake and continues vegetarian diet. His wife remains very supportive of lifestyle changes. His liver enzymes are WNL and he was recently started on crestor. He is down 5.3# since starting with our program.  Nutrition Goals Re-Evaluation:  Nutrition Goals Re-Evaluation     [place] Name 04/02/22 1620 05/03/22 1556           Goals   Current Weight 163 lb 9.3 oz (74.2 kg) 152 lb 1.9 oz (69 kg)  Weight from last attended session on 04/30/2022      Comment Cholesterol 99, HDL 33, A1c WNL, AST 50, ALT 61; Atorvastatin was stopped due to elevated due liver enzymes. No new labs available at this time; liver enzymes WNL      Expected Outcome Goals in action. [name] lives at home with his wife and children. He works from home. Both he and his wife do the grocery shopping and cooking. They follow a vegetarian lifestyle; he does enjoy eggs and dairy. They have switched from peanut oil to olive oil in cooking/frying. He also reports some boredom snacking especially in the evenings. Goals in action. [name] continues to attend the  [employer] and nutrition series. He has implemented many dietary changes including mindfulness of saturated fat intake and continues vegetarian diet. His wife remains very supportive of lifestyle changes. His liver enzymes are WNL and he was recently started on crestor. He is down 5.3# since starting with our program.               Nutrition Goals Discharge (Final Nutrition Goals Re-Evaluation):  Nutrition Goals Re-Evaluation - 05/03/22 1556       Goals   Current Weight 152 lb 1.9 oz (69 kg)   Weight from last attended session on 04/30/2022   Comment No new labs available at this time; liver enzymes WNL    Expected Outcome Goals in action. [name] continues to attend the [employer] and nutrition series. He has implemented many dietary changes including mindfulness of saturated fat intake and continues vegetarian diet. His wife remains very supportive of lifestyle changes. His liver enzymes are WNL and he was recently started on crestor. He is down 5.3# since starting with our program.             Psychosocial: Target Goals: Acknowledge presence or absence of significant depression and/or stress, maximize coping skills, provide positive support system. Participant is able to verbalize types and ability to use techniques and skills needed for reducing stress and depression.  Initial Review & Psychosocial Screening:  Initial Psych Review & Screening - 03/25/22 1342       Initial Review   Current issues with Current Stress Concerns    Source of Stress Concerns Occupation    Comments [name] says he has a highly stressful job. [name] plans to work half days on the days he attends cardiac rehab      [place]? Yes   [place] L[place] has his wife and 2 children for support     Barriers   Psychosocial barriers to participate in program The patient should benefit from training in stress management and relaxation.      Screening Interventions    Interventions Encouraged to exercise    Expected Outcomes Long Term Goal: Stressors or current issues are controlled or eliminated.             Quality of Life Scores:  Quality of Life - 03/25/22 1459       Quality of Life   Select Quality of Life      Quality of Life Scores   Health/Function Pre 19.37 %    Socioeconomic Pre 22.71 %    Psych/Spiritual Pre 22.86 %  Family Pre 22.8 %    GLOBAL Pre 21.28 %            Scores of 19 and below usually indicate a poorer quality of life in these areas.  A difference of  2-3 points is a clinically meaningful difference.  A difference of 2-3 points in the total score of the Quality of Life Index has been associated with significant improvement in overall quality of life, self-image, physical symptoms, and general health in studies assessing change in quality of life.  PHQ-9: Review Flowsheet  More data exists      04/22/2022 04/02/2022 03/11/2022 04/16/2021 04/15/2020  Depression screen PHQ 2/9  Decreased Interest 0 0 0 0 1  Down, Depressed, Hopeless 0 0 0 0 0  PHQ - 2 Score 0 0 0 0 1  Altered sleeping 0 - - 0 -  Tired, decreased energy 0 - - 0 -  Change in appetite 0 - - 0 -  Feeling bad or failure about yourself  0 - - 0 -  Trouble concentrating 0 - - 0 -  Moving slowly or fidgety/restless 0 - - 0 -  Suicidal thoughts 0 - - 0 -  PHQ-9 Score 0 - - 0 -  Difficult doing work/chores Not difficult at all - - - -   Interpretation of Total Score  Total Score Depression Severity:  1-4 = Minimal depression, 5-9 = Mild depression, 10-14 = Moderate depression, 15-19 = Moderately severe depression, 20-27 = Severe depression   Psychosocial Evaluation and Intervention:   Psychosocial Re-Evaluation:  Psychosocial Re-Evaluation     Row Name 04/02/22 1654 05/05/22 1004           Psychosocial Re-Evaluation   Current issues with Current Stress Concerns Current Stress Concerns      Comments [name] did not voice any incrased  concerns or stressors on his first day of exercise [name] did not voice any incrased concerns or stressors during exercise at intensive cardiac rehab      Expected Outcomes [name] will have drecreased or controlled stress upon completion of intensive cardiac rehab [name] will have drecreased or controlled stress upon completion of intensive cardiac rehab      Interventions Stress management education;Relaxation education;Encouraged to attend Cardiac Rehabilitation for the exercise Stress management education;Relaxation education;Encouraged to attend Cardiac Rehabilitation for the exercise      Continue Psychosocial Services  Follow up required by staff Follow up required by staff        Initial Review   Source of Stress Concerns Occupation Occupation      Comments Will continue to monitor and offer support as needed Will continue to monitor and offer support as needed               Psychosocial Discharge (Final Psychosocial Re-Evaluation):  Psychosocial Re-Evaluation - 05/05/22 1004       Psychosocial Re-Evaluation   Current issues with Current Stress Concerns    Comments [name] did not voice any incrased concerns or stressors during exercise at intensive cardiac rehab    Expected Outcomes [name] will have drecreased or controlled stress upon completion of intensive cardiac rehab    Interventions Stress management education;Relaxation education;Encouraged to attend Cardiac Rehabilitation for the exercise    Continue Psychosocial Services  Follow up required by staff      Initial Review   Source of Stress Concerns Occupation    Comments Will continue to monitor and offer support as needed  Vocational Rehabilitation: Provide vocational rehab assistance to qualifying candidates.   Vocational Rehab Evaluation & Intervention:  Vocational Rehab - 03/25/22 1519       Initial Vocational Rehab Evaluation & Intervention   Assessment shows need for Vocational  Rehabilitation No   [name] works full time and does not need voational rehab at this time            Education: Education Goals: Education classes will be provided on a weekly basis, covering required topics. Participant will state understanding/return demonstration of topics presented.    Education     Row Name 04/02/22 1500     Education   Cardiac Education Topics Pritikin   Environmental consultant Psychosocial   Psychosocial Workshop New Thoughts, New Behaviors   Instruction Review Code 1- Verbalizes Understanding   Class Start Time 6659   Class Stop Time 1447   Class Time Calculation (min) 52 min    [place] Name 04/08/22 0700     Education   Cardiac Education Topics Pritikin   Financial trader   Weekly Topic Fast Evening Meals   Instruction Review Code 1- Verbalizes Understanding   Class Start Time 1350   Class Stop Time 1440   Class Time Calculation (min) 50 min    [place] Name 04/09/22 1600     Education   Cardiac Education Topics Pritikin   Tax inspector Psychosocial   Psychosocial How Our Thoughts Can Heal Our Hearts   Instruction Review Code 1- Verbalizes Understanding   Class Start Time 1358   Class Stop Time 1436   Class Time Calculation (min) 38 min    [place] Name 04/14/22 1500     Education   Cardiac Education Topics Pritikin   Financial trader   Weekly Topic Adding Flavor - Sodium-Free   Instruction Review Code 1- Verbalizes Understanding   Class Start Time 9357   Class Stop Time 1440   Class Time Calculation (min) 45 min    [place] Name 04/28/22 1500     Education   Cardiac Education Topics Pritikin   Financial trader   Weekly Topic Personalizing Your Pritikin Plate   Instruction Review Code 1- Verbalizes  Understanding   Class Start Time 0177   Class Stop Time 1435   Class Time Calculation (min) 40 min    Row Name 04/30/22 1500     Education   Cardiac Education Topics Pritikin   Charity fundraiser Exercise Physiologist   Select Psychosocial   Psychosocial Healthy Minds, Bodies, Hearts   Instruction Review Code 1- Verbalizes Understanding   Class Start Time 1409   Class Stop Time 1445   Class Time Calculation (min) 36 min            Core Videos: Exercise    Move It!  Clinical staff conducted group or individual video education with verbal and written material and guidebook.  Patient learns the recommended Pritikin exercise program. Exercise with the goal of living a long, healthy life. Some of the health benefits of exercise include controlled diabetes, healthier blood pressure levels, improved cholesterol levels, improved heart and lung capacity,  improved sleep, and better body composition. Everyone should speak with their doctor before starting or changing an exercise routine.  Biomechanical Limitations Clinical staff conducted group or individual video education with verbal and written material and guidebook.  Patient learns how biomechanical limitations can impact exercise and how we can mitigate and possibly overcome limitations to have an impactful and balanced exercise routine.  Body Composition Clinical staff conducted group or individual video education with verbal and written material and guidebook.  Patient learns that body composition (ratio of muscle mass to fat mass) is a key component to assessing overall fitness, rather than body weight alone. Increased fat mass, especially visceral belly fat, can put [place] at increased risk for metabolic syndrome, type 2 diabetes, heart disease, and even death. It is recommended to combine diet and exercise (cardiovascular and resistance training) to improve your body composition. Seek guidance from your  physician and exercise physiologist before implementing an exercise routine.  Exercise Action Plan Clinical staff conducted group or individual video education with verbal and written material and guidebook.  Patient learns the recommended strategies to achieve and enjoy long-term exercise adherence, including variety, self-motivation, self-efficacy, and positive decision making. Benefits of exercise include fitness, good health, weight management, more energy, better sleep, less stress, and overall well-being.  Medical   Heart Disease Risk Reduction Clinical staff conducted group or individual video education with verbal and written material and guidebook.  Patient learns our heart is our most vital organ as it circulates oxygen, nutrients, white blood cells, and hormones throughout the entire body, and carries waste away. Data supports a plant-based eating plan like the Pritikin Program for its effectiveness in slowing progression of and reversing heart disease. The video provides a number of recommendations to address heart disease.   Metabolic Syndrome and Belly Fat  Clinical staff conducted group or individual video education with verbal and written material and guidebook.  Patient learns what metabolic syndrome is, how it leads to heart disease, and how one can reverse it and keep it from coming back. You have metabolic syndrome if you have 3 of the following 5 criteria: abdominal obesity, high blood pressure, high triglycerides, low HDL cholesterol, and high blood sugar.  Hypertension and Heart Disease Clinical staff conducted group or individual video education with verbal and written material and guidebook.  Patient learns that high blood pressure, or hypertension, is very common in the [place]. Hypertension is largely due to excessive salt intake, but other important risk factors include being overweight, physical inactivity, drinking too much alcohol, smoking, and not eating enough  potassium from fruits and vegetables. High blood pressure is a leading risk factor for heart attack, stroke, congestive heart failure, dementia, kidney failure, and premature death. Long-term effects of excessive salt intake include stiffening of the arteries and thickening of heart muscle and organ damage. Recommendations include ways to reduce hypertension and the risk of heart disease.  Diseases of Our Time - Focusing on Diabetes Clinical staff conducted group or individual video education with verbal and written material and guidebook.  Patient learns why the best way to stop diseases of our time is prevention, through food and other lifestyle changes. Medicine (such as prescription pills and surgeries) is often only a Band-Aid on the problem, not a long-term solution. Most common diseases of our time include obesity, type 2 diabetes, hypertension, heart disease, and cancer. The Pritikin Program is recommended and has been proven to help reduce, reverse, and/or prevent the damaging effects of metabolic syndrome.  Nutrition   Overview of the Pritikin Eating Plan  Clinical staff conducted group or individual video education with verbal and written material and guidebook.  Patient learns about the [place] for disease risk reduction. The [place] emphasizes a wide variety of unrefined, minimally-processed carbohydrates, like fruits, vegetables, whole grains, and legumes. Go, Caution, and Stop food choices are explained. Plant-based and lean animal proteins are emphasized. Rationale provided for low sodium intake for blood pressure control, low added sugars for blood sugar stabilization, and low added fats and oils for coronary artery disease risk reduction and weight management.  Calorie Density  Clinical staff conducted group or individual video education with verbal and written material and guidebook.  Patient learns about calorie density and how it impacts the Pritikin Eating  Plan. Knowing the characteristics of the food you choose will help you decide whether those foods will lead to weight gain or weight loss, and whether you want to consume more or less of them. Weight loss is usually a side effect of the Pritikin Eating Plan because of its focus on low calorie-dense foods.  Label Reading  Clinical staff conducted group or individual video education with verbal and written material and guidebook.  Patient learns about the Pritikin recommended label reading guidelines and corresponding recommendations regarding calorie density, added sugars, sodium content, and whole grains.  Dining Out - Part 1  Clinical staff conducted group or individual video education with verbal and written material and guidebook.  Patient learns that restaurant meals can be sabotaging because they can be so high in calories, fat, sodium, and/or sugar. Patient learns recommended strategies on how to positively address this and avoid unhealthy pitfalls.  Facts on Fats  Clinical staff conducted group or individual video education with verbal and written material and guidebook.  Patient learns that lifestyle modifications can be just as effective, if not more so, as many medications for lowering your risk of heart disease. A Pritikin lifestyle can help to reduce your risk of inflammation and atherosclerosis (cholesterol build-up, or plaque, in the artery walls). Lifestyle interventions such as dietary choices and physical activity address the cause of atherosclerosis. A review of the types of fats and their impact on blood cholesterol levels, along with dietary recommendations to reduce fat intake is also included.  Nutrition Action Plan  Clinical staff conducted group or individual video education with verbal and written material and guidebook.  Patient learns how to incorporate Pritikin recommendations into their lifestyle. Recommendations include planning and keeping personal health goals in mind  as an important part of their success.  Healthy Mind-Set    Healthy Minds, Bodies, Hearts  Clinical staff conducted group or individual video education with verbal and written material and guidebook.  Patient learns how to identify when they are stressed. Video will discuss the impact of that stress, as well as the many benefits of stress management. Patient will also be introduced to stress management techniques. The way we think, act, and feel has an impact on our hearts.  How Our Thoughts Can Heal Our Hearts  Clinical staff conducted group or individual video education with verbal and written material and guidebook.  Patient learns that negative thoughts can cause depression and anxiety. This can result in negative lifestyle behavior and serious health problems. Cognitive behavioral therapy is an effective method to help control our thoughts in order to change and improve our emotional outlook.  Additional Videos:  Exercise    Improving Performance  Clinical staff conducted  group or individual video education with verbal and written material and guidebook.  Patient learns to use a non-linear approach by alternating intensity levels and lengths of time spent exercising to help burn more calories and lose more body fat. Cardiovascular exercise helps improve heart health, metabolism, hormonal balance, blood sugar control, and recovery from fatigue. Resistance training improves strength, endurance, balance, coordination, reaction time, metabolism, and muscle mass. Flexibility exercise improves circulation, posture, and balance. Seek guidance from your physician and exercise physiologist before implementing an exercise routine and learn your capabilities and proper form for all exercise.  Introduction to Yoga  Clinical staff conducted group or individual video education with verbal and written material and guidebook.  Patient learns about yoga, a discipline of the coming together of mind, breath,  and body. The benefits of yoga include improved flexibility, improved range of motion, better posture and core strength, increased lung function, weight loss, and positive self-image. Yoga's heart health benefits include lowered blood pressure, healthier heart rate, decreased cholesterol and triglyceride levels, improved immune function, and reduced stress. Seek guidance from your physician and exercise physiologist before implementing an exercise routine and learn your capabilities and proper form for all exercise.  Medical   Aging: Enhancing Your Quality of Life  Clinical staff conducted group or individual video education with verbal and written material and guidebook.  Patient learns key strategies and recommendations to stay in good physical health and enhance quality of life, such as prevention strategies, having an advocate, securing a [place], and keeping a list of medications and system for tracking them. It also discusses how to avoid risk for bone loss.  Biology of Weight Control  Clinical staff conducted group or individual video education with verbal and written material and guidebook.  Patient learns that weight gain occurs because we consume more calories than we burn (eating more, moving less). Even if your body weight is normal, you may have higher ratios of fat compared to muscle mass. Too much body fat puts you at increased risk for cardiovascular disease, heart attack, stroke, type 2 diabetes, and obesity-related cancers. In addition to exercise, following the [place] can help reduce your risk.  Decoding Lab Results  Clinical staff conducted group or individual video education with verbal and written material and guidebook.  Patient learns that lab test reflects one measurement whose values change over time and are influenced by many factors, including medication, stress, sleep, exercise, food, hydration, pre-existing medical conditions,  and more. It is recommended to use the knowledge from this video to become more involved with your lab results and evaluate your numbers to speak with your doctor.   Diseases of Our Time - Overview  Clinical staff conducted group or individual video education with verbal and written material and guidebook.  Patient learns that according to the CDC, 50% to 70% of chronic diseases (such as obesity, type 2 diabetes, elevated lipids, hypertension, and heart disease) are avoidable through lifestyle improvements including healthier food choices, listening to satiety cues, and increased physical activity.  Sleep Disorders Clinical staff conducted group or individual video education with verbal and written material and guidebook.  Patient learns how good quality and duration of sleep are important to overall health and well-being. Patient also learns about sleep disorders and how they impact health along with recommendations to address them, including discussing with a physician.  Nutrition  Dining Out - Part 2 Clinical staff conducted group or individual video education with  verbal and written material and guidebook.  Patient learns how to plan ahead and communicate in order to maximize their dining experience in a healthy and nutritious manner. Included are recommended food choices based on the type of restaurant the patient is visiting.   Fueling a Best boy conducted group or individual video education with verbal and written material and guidebook.  There is a strong connection between our food choices and our health. Diseases like obesity and type 2 diabetes are very prevalent and are in large-part due to lifestyle choices. The Pritikin Eating Plan provides plenty of food and hunger-curbing satisfaction. It is easy to follow, affordable, and helps reduce health risks.  Menu Workshop  Clinical staff conducted group or individual video education with verbal and written material  and guidebook.  Patient learns that restaurant meals can sabotage health goals because they are often packed with calories, fat, sodium, and sugar. Recommendations include strategies to plan ahead and to communicate with the manager, chef, or server to help order a healthier meal.  Planning Your Eating Strategy  Clinical staff conducted group or individual video education with verbal and written material and guidebook.  Patient learns about the [place] and its benefit of reducing the risk of disease. The [place] does not focus on calories. Instead, it emphasizes high-quality, nutrient-rich foods. By knowing the characteristics of the foods, we choose, we can determine their calorie density and make informed decisions.  Targeting Your Nutrition Priorities  Clinical staff conducted group or individual video education with verbal and written material and guidebook.  Patient learns that lifestyle habits have a tremendous impact on disease risk and progression. This video provides eating and physical activity recommendations based on your personal health goals, such as reducing LDL cholesterol, losing weight, preventing or controlling type 2 diabetes, and reducing high blood pressure.  Vitamins and Minerals  Clinical staff conducted group or individual video education with verbal and written material and guidebook.  Patient learns different ways to obtain key vitamins and minerals, including through a recommended healthy diet. It is important to discuss all supplements you take with your doctor.   Healthy Mind-Set    Smoking Cessation  Clinical staff conducted group or individual video education with verbal and written material and guidebook.  Patient learns that cigarette smoking and tobacco addiction pose a serious health risk which affects millions of people. Stopping smoking will significantly reduce the risk of heart disease, lung disease, and many forms of cancer.  Recommended strategies for quitting are covered, including working with your doctor to develop a successful plan.  Culinary   Becoming a Financial trader conducted group or individual video education with verbal and written material and guidebook.  Patient learns that cooking at home can be healthy, cost-effective, quick, and puts them in control. Keys to cooking healthy recipes will include looking at your recipe, assessing your equipment needs, planning ahead, making it simple, choosing cost-effective seasonal ingredients, and limiting the use of added fats, salts, and sugars.  Cooking - Breakfast and Snacks  Clinical staff conducted group or individual video education with verbal and written material and guidebook.  Patient learns how important breakfast is to satiety and nutrition through the entire day. Recommendations include key foods to eat during breakfast to help stabilize blood sugar levels and to prevent overeating at meals later in the day. Planning ahead is also a key component.  Cooking - Human resources officer conducted  group or individual video education with verbal and written material and guidebook.  Patient learns eating strategies to improve overall health, including an approach to cook more at home. Recommendations include thinking of animal protein as a side on your plate rather than center stage and focusing instead on lower calorie dense options like vegetables, fruits, whole grains, and plant-based proteins, such as beans. Making sauces in large quantities to freeze for later and leaving the skin on your vegetables are also recommended to maximize your experience.  Cooking - Healthy Salads and Dressing Clinical staff conducted group or individual video education with verbal and written material and guidebook.  Patient learns that vegetables, fruits, whole grains, and legumes are the foundations of the [place]. Recommendations include how  to incorporate each of these in flavorful and healthy salads, and how to create homemade salad dressings. Proper handling of ingredients is also covered. Cooking - Soups and [employer] - Soups and Desserts Clinical staff conducted group or individual video education with verbal and written material and guidebook.  Patient learns that Pritikin soups and desserts make for easy, nutritious, and delicious snacks and meal components that are low in sodium, fat, sugar, and calorie density, while high in vitamins, minerals, and filling fiber. Recommendations include simple and healthy ideas for soups and desserts.   Overview     The Pritikin Solution Program Overview Clinical staff conducted group or individual video education with verbal and written material and guidebook.  Patient learns that the results of the [place] Program have been documented in more than 100 articles published in peer-reviewed journals, and the benefits include reducing risk factors for (and, in some cases, even reversing) high cholesterol, high blood pressure, type 2 diabetes, obesity, and more! An overview of the three key pillars of the Pritikin Program will be covered: eating well, doing regular exercise, and having a healthy mind-set.  WORKSHOPS  Exercise: Exercise Basics: Building Your Action Plan Clinical staff led group instruction and group discussion with PowerPoint presentation and patient guidebook. To enhance the learning environment the use of posters, models and videos may be added. At the conclusion of this workshop, patients will comprehend the difference between physical activity and exercise, as well as the benefits of incorporating both, into their routine. Patients will understand the FITT (Frequency, Intensity, Time, and Type) principle and how to use it to build an exercise action plan. In addition, safety concerns and other considerations for exercise and cardiac rehab will be addressed by the  presenter. The purpose of this lesson is to promote a comprehensive and effective weekly exercise routine in order to improve patients' overall level of fitness.   Managing Heart Disease: Your Path to a Healthier Heart Clinical staff led group instruction and group discussion with PowerPoint presentation and patient guidebook. To enhance the learning environment the use of posters, models and videos may be added.At the conclusion of this workshop, patients will understand the anatomy and physiology of the heart. Additionally, they will understand how Pritikin's three pillars impact the risk factors, the progression, and the management of heart disease.  The purpose of this lesson is to provide a high-level overview of the heart, heart disease, and how the Pritikin lifestyle positively impacts risk factors.  Exercise Biomechanics Clinical staff led group instruction and group discussion with PowerPoint presentation and patient guidebook. To enhance the learning environment the use of posters, models and videos may be added. Patients will learn how the structural parts of their bodies function  and how these functions impact their daily activities, movement, and exercise. Patients will learn how to promote a neutral spine, learn how to manage pain, and identify ways to improve their physical movement in order to promote healthy living. The purpose of this lesson is to expose patients to common physical limitations that impact physical activity. Participants will learn practical ways to adapt and manage aches and pains, and to minimize their effect on regular exercise. Patients will learn how to maintain good posture while sitting, walking, and lifting.  Balance Training and Fall Prevention  Clinical staff led group instruction and group discussion with PowerPoint presentation and patient guidebook. To enhance the learning environment the use of posters, models and videos may be added. At the  conclusion of this workshop, patients will understand the importance of their sensorimotor skills (vision, proprioception, and the vestibular system) in maintaining their ability to balance as they age. Patients will apply a variety of balancing exercises that are appropriate for their current level of function. Patients will understand the common causes for poor balance, possible solutions to these problems, and ways to modify their physical environment in order to minimize their fall risk. The purpose of this lesson is to teach patients about the importance of maintaining balance as they age and ways to minimize their risk of falling.  WORKSHOPS   Nutrition:  Fueling a Scientist, research (physical sciences) led group instruction and group discussion with PowerPoint presentation and patient guidebook. To enhance the learning environment the use of posters, models and videos may be added. Patients will review the foundational principles of the [place] and understand what constitutes a serving size in each of the food groups. Patients will also learn Pritikin-friendly foods that are better choices when away from home and review make-ahead meal and snack options. Calorie density will be reviewed and applied to three nutrition priorities: weight maintenance, weight loss, and weight gain. The purpose of this lesson is to reinforce (in a group setting) the key concepts around what patients are recommended to eat and how to apply these guidelines when away from home by planning and selecting Pritikin-friendly options. Patients will understand how calorie density may be adjusted for different weight management goals.  Mindful Eating  Clinical staff led group instruction and group discussion with PowerPoint presentation and patient guidebook. To enhance the learning environment the use of posters, models and videos may be added. Patients will briefly review the concepts of the [place] and the  importance of low-calorie dense foods. The concept of mindful eating will be introduced as well as the importance of paying attention to internal hunger signals. Triggers for non-hunger eating and techniques for dealing with triggers will be explored. The purpose of this lesson is to provide patients with the opportunity to review the basic principles of the [place], discuss the value of eating mindfully and how to measure internal cues of hunger and fullness using the Hunger Scale. Patients will also discuss reasons for non-hunger eating and learn strategies to use for controlling emotional eating.  Targeting Your Nutrition Priorities Clinical staff led group instruction and group discussion with PowerPoint presentation and patient guidebook. To enhance the learning environment the use of posters, models and videos may be added. Patients will learn how to determine their genetic susceptibility to disease by reviewing their family history. Patients will gain insight into the importance of diet as part of an overall healthy lifestyle in mitigating the impact of genetics and other environmental  insults. The purpose of this lesson is to provide patients with the opportunity to assess their personal nutrition priorities by looking at their family history, their own health history and current risk factors. Patients will also be able to discuss ways of prioritizing and modifying the [place] for their highest risk areas  Menu  Clinical staff led group instruction and group discussion with PowerPoint presentation and patient guidebook. To enhance the learning environment the use of posters, models and videos may be added. Using menus brought in from [employer], or printed from [employer], patients will apply the [place] dining out guidelines that were presented in the [employer] video. Patients will also be able to practice these guidelines in a variety of  provided scenarios. The purpose of this lesson is to provide patients with the opportunity to practice hands-on learning of the [place] with actual menus and practice scenarios.  Label Reading Clinical staff led group instruction and group discussion with PowerPoint presentation and patient guidebook. To enhance the learning environment the use of posters, models and videos may be added. Patients will review and discuss the Pritikin label reading guidelines presented in Pritikin's Label Reading Educational series video. Using fool labels brought in from local grocery stores and markets, patients will apply the label reading guidelines and determine if the packaged food meet the Pritikin guidelines. The purpose of this lesson is to provide patients with the opportunity to review, discuss, and practice hands-on learning of the Pritikin Label Reading guidelines with actual packaged food labels. [place] [facility]hops are designed to teach patients ways to prepare quick, simple, and affordable recipes at home. The importance of nutrition's role in chronic disease risk reduction is reflected in its emphasis in the overall Pritikin program. By learning how to prepare essential core Pritikin Eating Plan recipes, patients will increase control over what they eat; be able to customize the flavor of foods without the use of added salt, sugar, or fat; and improve the quality of the food they consume. By learning a set of core recipes which are easily assembled, quickly prepared, and affordable, patients are more likely to prepare more healthy foods at home. These workshops focus on convenient breakfasts, simple entres, side dishes, and desserts which can be prepared with minimal effort and are consistent with nutrition recommendations for cardiovascular risk reduction. Cooking [employer] are taught by a Engineer, materials (RD) who has been trained by the  [employer]. The chef or RD has a clear understanding of the importance of minimizing - if not completely eliminating - added fat, sugar, and sodium in recipes. Throughout the series of [place] Workshop sessions, patients will learn about healthy ingredients and efficient methods of cooking to build confidence in their capability to prepare    Cooking School weekly topics:  Adding Flavor- Sodium-Free  Fast and Healthy Breakfasts  Powerhouse Plant-Based Proteins  Satisfying Salads and Dressings  Simple Sides and Sauces  International Cuisine-Spotlight on the [place] Zones  Delicious Desserts  Savory Soups  Efficiency Cooking - Meals in a Snap  Tasty Appetizers and Snacks  Comforting Weekend Breakfasts  One-Pot Wonders   Fast Evening Meals  Easy [place] (Psychosocial): New Thoughts, New Behaviors Clinical staff led group instruction and group discussion with PowerPoint presentation and patient guidebook. To enhance the learning environment the use of posters, models and videos may  be added. Patients will learn and practice techniques for developing effective health and lifestyle goals. Patients will be able to effectively apply the goal setting process learned to develop at least one new personal goal.  The purpose of this lesson is to expose patients to a new skill set of behavior modification techniques such as techniques setting SMART goals, overcoming barriers, and achieving new thoughts and new behaviors.  Managing Moods and Relationships Clinical staff led group instruction and group discussion with PowerPoint presentation and patient guidebook. To enhance the learning environment the use of posters, models and videos may be added. Patients will learn how emotional and chronic stress factors can impact their health and relationships. They will learn healthy ways to manage their moods and utilize  positive coping mechanisms. In addition, ICR patients will learn ways to improve communication skills. The purpose of this lesson is to expose patients to ways of understanding how one's mood and health are intimately connected. Developing a healthy outlook can help build positive relationships and connections with others. Patients will understand the importance of utilizing effective communication skills that include actively listening and being heard. They will learn and understand the importance of the "4 Cs" and especially Connections in fostering of a Healthy Mind-Set.  Healthy Sleep for a Healthy Heart Clinical staff led group instruction and group discussion with PowerPoint presentation and patient guidebook. To enhance the learning environment the use of posters, models and videos may be added. At the conclusion of this workshop, patients will be able to demonstrate knowledge of the importance of sleep to overall health, well-being, and quality of life. They will understand the symptoms of, and treatments for, common sleep disorders. Patients will also be able to identify daytime and nighttime behaviors which impact sleep, and they will be able to apply these tools to help manage sleep-related challenges. The purpose of this lesson is to provide patients with a general overview of sleep and outline the importance of quality sleep. Patients will learn about a few of the most common sleep disorders. Patients will also be introduced to the concept of "sleep hygiene," and discover ways to self-manage certain sleeping problems through simple daily behavior changes. Finally, the workshop will motivate patients by clarifying the links between quality sleep and their goals of heart-healthy living.   Recognizing and Reducing Stress Clinical staff led group instruction and group discussion with PowerPoint presentation and patient guidebook. To enhance the learning environment the use of posters, models and  videos may be added. At the conclusion of this workshop, patients will be able to understand the types of stress reactions, differentiate between acute and chronic stress, and recognize the impact that chronic stress has on their health. They will also be able to apply different coping mechanisms, such as reframing negative self-talk. Patients will have the opportunity to practice a variety of stress management techniques, such as deep abdominal breathing, progressive muscle relaxation, and/or guided imagery.  The purpose of this lesson is to educate patients on the role of stress in their lives and to provide healthy techniques for coping with it.  Learning Barriers/Preferences:  Learning Barriers/Preferences - 03/25/22 1500       Learning Barriers/Preferences   Learning Barriers Sight;Exercise Concerns   some dizziness, wears glasses   Learning Preferences Computer/Internet;Individual Instruction;Group Instruction;Pictoral;Written Material             Education Topics:  Knowledge Questionnaire Score:  Knowledge Questionnaire Score - 03/25/22 1501       Knowledge Questionnaire  Score   Pre Score 22/24             Core Components/Risk Factors/Patient Goals at Admission:  Personal Goals and Risk Factors at Admission - 03/25/22 1502       Core Components/Risk Factors/Patient Goals on Admission    Weight Management Weight Maintenance;Yes    Intervention Weight Management: Provide education and appropriate resources to help participant work on and attain dietary goals.;Weight Management: Develop a combined nutrition and exercise program designed to reach desired caloric intake, while maintaining appropriate intake of nutrient and fiber, sodium and fats, and appropriate energy expenditure required for the weight goal.    Expected Outcomes Weight Maintenance: Understanding of the daily nutrition guidelines, which includes 25-35% calories from fat, 7% or less cal from saturated fats,  less than 251m cholesterol, less than 1.5gm of sodium, & 5 or more servings of fruits and vegetables daily;Understanding recommendations for meals to include 15-35% energy as protein, 25-35% energy from fat, 35-60% energy from carbohydrates, less than 209mof dietary cholesterol, 20-35 gm of total fiber daily;Understanding of distribution of calorie intake throughout the day with the consumption of 4-5 meals/snacks    Lipids Yes    Intervention Provide education and support for participant on nutrition & aerobic/resistive exercise along with prescribed medications to achieve LDL <7028mHDL >30m54m  Expected Outcomes Short Term: Participant states understanding of desired cholesterol values and is compliant with medications prescribed. Participant is following exercise prescription and nutrition guidelines.;Long Term: Cholesterol controlled with medications as prescribed, with individualized exercise RX and with personalized nutrition plan. Value goals: LDL < 70mg59mL > 40 mg.    Stress Yes    Intervention Offer individual and/or small group education and counseling on adjustment to heart disease, stress management and health-related lifestyle change. Teach and support self-help strategies.;Refer participants experiencing significant psychosocial distress to appropriate mental health specialists for further evaluation and treatment. When possible, include family members and significant others in education/counseling sessions.    Expected Outcomes Short Term: Participant demonstrates changes in health-related behavior, relaxation and other stress management skills, ability to obtain effective social support, and compliance with psychotropic medications if prescribed.;Long Term: Emotional wellbeing is indicated by absence of clinically significant psychosocial distress or social isolation.    Personal Goal Other Yes    Personal Goal Short: get back on his feet Long: inc. strength    Intervention Will  continue to monitor pt and progress workloads as tolerated without sign or symptom    Expected Outcomes Pt will achieve his goals and gain strength.             Core Components/Risk Factors/Patient Goals Review:   Goals and Risk Factor Review     Row Name 04/02/22 1656 05/05/22 1005           Core Components/Risk Factors/Patient Goals Review   Personal Goals Review Weight Management/Obesity;Lipids;Stress Weight Management/Obesity;Lipids;Stress      Review [name] started cardiac rehab on 04/02/22 and did well with exercise, vital signs stable [name] has been doing well with exercise. Systolic BP's has been in the 90's at times. Asymptomatic. [name] has lost 2.4 kg since starting cardiac rehab. Will continue to monitor BP      Expected Outcomes [name] will continue to participate in intensive cardiac rehab for exercise, nutrition and lifestyle modifications [name] will continue to participate in intensive cardiac rehab for exercise, nutrition and lifestyle modifications               Core  Components/Risk Factors/Patient Goals at Discharge (Final Review):   Goals and Risk Factor Review - 05/05/22 1005       Core Components/Risk Factors/Patient Goals Review   Personal Goals Review Weight Management/Obesity;Lipids;Stress    Review Cresenciano Lick has been doing well with exercise. Systolic BP's has been in the 90's at times. Asymptomatic. [name] has lost 2.4 kg since starting cardiac rehab. Will continue to monitor BP    Expected Outcomes [name] will continue to participate in intensive cardiac rehab for exercise, nutrition and lifestyle modifications             ITP Comments:  ITP Comments     Row Name 03/25/22 1339 04/05/22 0842 05/05/22 1003       ITP Comments Dr Fransico Him MD, Medical Director, Introduction to Pritikin Education Program/ Intensive Cardiac Rehab. Initial Orientation Packet Reviewed with the patient 30 Day ITP Review. [name] started intensive cardiac rehab on  04/02/22 and did well with exercise. 30 Day ITP Review. [name] has good attendance and participation in intensive cardiac rehab.              Comments: See ITP comments.Harrell Gave RN BSN

## 2022-05-05 NOTE — Progress Notes (Signed)
Post exercise BP 89/91 heart rate 97. Patient asymptomatic given H20. Recheck BP 86/60. Second bottle of water given to the patient. Recheck standing BP 89/65 then 98/72. Remain's asymptomatic. Medications reviewed taking as prescribed. . Today's vital signs forwarded to Dr Swaz[place]and for review. No complaints upon exit from cardiac rehab.Will continue to monitor the patient throughout  the program.Cella Cappello Harlon Flor, RN,BSN 05/05/2022 4:18 PM

## 2022-05-06 ENCOUNTER — Telehealth (HOSPITAL_COMMUNITY): Payer: Self-pay | Admitting: *Deleted

## 2022-05-06 NOTE — Telephone Encounter (Signed)
-----   Message from Peter M Swaziland, MD sent at 05/05/2022  4:54 PM EST ----- Continue to monitor. Let me know if he becomes symptomatic  Peter Swaziland MD, [facility]   ----- Message ----- From: Cammy Copa, RN Sent: 05/05/2022   4:14 PM EST To: Peter M Swaziland, MD  Please see blood pressures from today. Systolic post exercise BP's were in the 80's asymptomatic. Any concerns? Taking entresto spirlactalone as prescribed.   Thanks for your assistance,  Byrd Hesselbach

## 2022-05-06 NOTE — Telephone Encounter (Signed)
Patient returned a call and was given sleep study results and recommendations. ?

## 2022-05-07 ENCOUNTER — Encounter (HOSPITAL_COMMUNITY)
Admission: RE | Admit: 2022-05-07 | Discharge: 2022-05-07 | Disposition: A | Discharge status: Home / Self Care | Payer: No Typology Code available for payment source | Source: Ambulatory Visit | Attending: Cardiology | Admitting: Cardiology

## 2022-05-07 DIAGNOSIS — Z955 Presence of coronary angioplasty implant and graft: Secondary | ICD-10-CM

## 2022-05-07 DIAGNOSIS — Z5189 Encounter for other specified aftercare: Secondary | ICD-10-CM | POA: Diagnosis not present

## 2022-05-07 DIAGNOSIS — I2102 ST elevation (STEMI) myocardial infarction involving left anterior descending coronary artery: Secondary | ICD-10-CM

## 2022-05-12 ENCOUNTER — Encounter (HOSPITAL_COMMUNITY)
Admission: RE | Admit: 2022-05-12 | Discharge: 2022-05-12 | Disposition: A | Discharge status: Home / Self Care | Payer: No Typology Code available for payment source | Source: Ambulatory Visit | Attending: Cardiology | Admitting: Cardiology

## 2022-05-12 DIAGNOSIS — Z955 Presence of coronary angioplasty implant and graft: Secondary | ICD-10-CM

## 2022-05-12 DIAGNOSIS — Z5189 Encounter for other specified aftercare: Secondary | ICD-10-CM | POA: Diagnosis not present

## 2022-05-12 DIAGNOSIS — I2102 ST elevation (STEMI) myocardial infarction involving left anterior descending coronary artery: Secondary | ICD-10-CM

## 2022-05-14 ENCOUNTER — Encounter (HOSPITAL_COMMUNITY)
Admission: RE | Admit: 2022-05-14 | Discharge: 2022-05-14 | Disposition: A | Discharge status: Home / Self Care | Payer: No Typology Code available for payment source | Source: Ambulatory Visit | Attending: Cardiology | Admitting: Cardiology

## 2022-05-14 ENCOUNTER — Other Ambulatory Visit (INDEPENDENT_AMBULATORY_CARE_PROVIDER_SITE_OTHER): Payer: No Typology Code available for payment source

## 2022-05-14 DIAGNOSIS — Z955 Presence of coronary angioplasty implant and graft: Secondary | ICD-10-CM

## 2022-05-14 DIAGNOSIS — E785 Hyperlipidemia, unspecified: Secondary | ICD-10-CM | POA: Diagnosis not present

## 2022-05-14 DIAGNOSIS — R739 Hyperglycemia, unspecified: Secondary | ICD-10-CM | POA: Diagnosis not present

## 2022-05-14 DIAGNOSIS — R81 Glycosuria: Secondary | ICD-10-CM

## 2022-05-14 DIAGNOSIS — R351 Nocturia: Secondary | ICD-10-CM

## 2022-05-14 DIAGNOSIS — I2102 ST elevation (STEMI) myocardial infarction involving left anterior descending coronary artery: Secondary | ICD-10-CM

## 2022-05-14 DIAGNOSIS — Z5189 Encounter for other specified aftercare: Secondary | ICD-10-CM | POA: Diagnosis not present

## 2022-05-14 LAB — URINALYSIS
Bilirubin Urine: NEGATIVE
Hgb urine dipstick: NEGATIVE
Ketones, ur: NEGATIVE
Leukocytes,Ua: NEGATIVE
Nitrite: NEGATIVE
Specific Gravity, Urine: 1.02 (ref 1.000–1.030)
Total Protein, Urine: NEGATIVE
Urine Glucose: >=1000 — AB
Urobilinogen, UA: 0.2 (ref 0.0–1.0)
pH: 6 (ref 5.0–8.0)

## 2022-05-14 LAB — COMPREHENSIVE METABOLIC PANEL
ALT: 16 U/L (ref 0–53)
AST: 16 U/L (ref 0–37)
Albumin: 4.5 g/dL (ref 3.5–5.2)
Alkaline Phosphatase: 89 U/L (ref 39–117)
BUN: 13 mg/dL (ref 6–23)
CO2: 31 mEq/L (ref 19–32)
Calcium: 9.1 mg/dL (ref 8.4–10.5)
Chloride: 101 mEq/L (ref 96–112)
Creatinine, Ser: 0.89 mg/dL (ref 0.40–1.50)
GFR: 95.21 mL/min (ref >60.00)
Glucose, Bld: 87 mg/dL (ref 70–99)
Potassium: 4.8 mEq/L (ref 3.5–5.1)
Sodium: 135 mEq/L (ref 135–145)
Total Bilirubin: 1.9 mg/dL — ABNORMAL HIGH (ref 0.2–1.2)
Total Protein: 7 g/dL (ref 6.0–8.3)

## 2022-05-14 LAB — PSA: PSA: 0.58 ng/mL (ref 0.10–4.00)

## 2022-05-14 LAB — HEMOGLOBIN A1C: Hgb A1c MFr Bld: 5.6 % (ref 4.6–6.5)

## 2022-05-17 ENCOUNTER — Encounter: Payer: Self-pay | Admitting: Family Medicine

## 2022-05-19 ENCOUNTER — Encounter (HOSPITAL_COMMUNITY)
Admission: RE | Admit: 2022-05-19 | Discharge: 2022-05-19 | Disposition: A | Discharge status: Home / Self Care | Payer: No Typology Code available for payment source | Source: Ambulatory Visit | Attending: Cardiology | Admitting: Cardiology

## 2022-05-19 DIAGNOSIS — Z955 Presence of coronary angioplasty implant and graft: Secondary | ICD-10-CM

## 2022-05-19 DIAGNOSIS — I2102 ST elevation (STEMI) myocardial infarction involving left anterior descending coronary artery: Secondary | ICD-10-CM

## 2022-05-19 DIAGNOSIS — Z5189 Encounter for other specified aftercare: Secondary | ICD-10-CM | POA: Diagnosis not present

## 2022-05-21 ENCOUNTER — Encounter (HOSPITAL_COMMUNITY): Payer: No Typology Code available for payment source

## 2022-05-25 NOTE — Progress Notes (Unsigned)
Cardiology Office Note:    Date:  05/27/2022   ID:  Michael Gibbs, DOB August 16, 1965, MRN [name]  PCP:  Michael Lukes, MD   [facility] Cardiologist:  Michael Rocha [place], MD     Referring MD: Michael Lukes, MD   Chief Complaint  Patient presents with   Follow-up    1 month.   Coronary Artery Disease    History of Present Illness:    Michael Gibbs is a 56 y.o. male with a hx of CAD. He was admitted in July 2023 with STEMI.  LHC showed occluded proximal LAD, underwent successful PCI with DES to LAD.  Echocardiogram showed EF 30 to 35%.  He was started on DAPT with aspirin and Brilinta, as well as high intensity statin and beta-blocker and Farxiga.  He was not initially started on ACE/ARB/ARNI or MRA due to low BP.  Repeat echocardiogram 03/29/2022 showed EF remains 30 to 35%.  He is currently on losartan 25 mg daily, Farxiga 10 mg daily, spironolactone 12.5 mg daily, aspirin and Brilinta.  He did develop significant myalgias on high dose lipitor. Also had some bilirubin elevation. These symptoms resolved off lipitor. He is tolerating his other medication well. Denies any dizziness, dyspnea or palpitations. He is participating in [place]. This is going well. No significant chest pain or dyspnea.  He did have a sleep study showing very mild sleep apnea. Conservative therapy recommended.     Past Medical History:  Diagnosis Date   Allergic state 04/25/2007   Seasonal allergies-well controlled with Allegra and Flonase     CAD (coronary artery disease)    a. 12/2021: s/p STEMI with DES to proximal LAD   Dermatitis 09/22/2011   Headache(784.0)    Headache(784.0) 09/22/2011   HFrEF (heart failure with reduced ejection fraction) ([place])    a. EF 30-35% in 12/2021 in the setting of anterior STEMI   Hyperlipidemia, mixed 08/11/2014   Preventative health care 04/02/2011   Sleep apnea    Sleep apnea     Past Surgical History:  Procedure  Laterality Date   CARDIAC CATHETERIZATION     CORONARY/GRAFT ACUTE MI REVASCULARIZATION N/A 01/20/2022   Procedure: Coronary/Graft Acute MI Revascularization;  Surgeon: Gibbs, Michael Mara M, MD;  Location: [facility];  Service: Cardiovascular;  Laterality: N/A;   LEFT HEART CATH AND CORONARY ANGIOGRAPHY N/A 01/20/2022   Procedure: LEFT HEART CATH AND CORONARY ANGIOGRAPHY;  Surgeon: Gibbs, Michael Arey M, MD;  Location: [facility];  Service: Cardiovascular;  Laterality: N/A;    Current Medications: Current Meds  Medication Sig   aspirin EC 81 MG tablet Take 1 tablet (81 mg total) by mouth daily. Swallow whole.   dapagliflozin propanediol (FARXIGA) 10 MG TABS tablet Take 1 tablet (10 mg total) by mouth daily.   nitroGLYCERIN (NITROSTAT) 0.4 MG SL tablet Place 1 tablet (0.4 mg total) under the tongue every 5 (five) minutes x 3 doses as needed for chest pain.   rosuvastatin (CRESTOR) 5 MG tablet Take 1 tablet (5 mg total) by mouth daily.   sacubitril-valsartan (ENTRESTO) 24-26 MG Take 1 tablet by mouth 2 (two) times daily.   spironolactone (ALDACTONE) 25 MG tablet Take 1/2 tablet ( 12.5 mg ) daily   ticagrelor (BRILINTA) 90 MG TABS tablet Take 1 tablet (90 mg total) by mouth 2 (two) times daily.   triamcinolone cream (KENALOG) 0.1 % Apply 1 Application topically 2 (two) times daily.     Allergies:   Lipitor [  atorvastatin]   Social History   Socioeconomic History   Marital status: Married    Spouse name: Not on file   Number of children: 2   Years of education: 16   Highest education level: Master's degree (e.g., MA, MS, MEng, MEd, MSW, MBA)  Occupational History   Not on file  Tobacco Use   Smoking status: Never   Smokeless tobacco: Never  Vaping Use   Vaping Use: Not on file  Substance and Sexual Activity   Alcohol use: Yes    Comment: rare beer   Drug use: No   Sexual activity: Yes    Comment: lives with wife,  works in Engineer, technical sales, vegetarian, wears a seat baelt  Other Topics  Concern   Not on file  Social History Narrative   Patient is from [place] but lives in [place], has been in the [place] since 1996, lives with wife and 2 children-ages 62 and 67, wife works from home, her office is in [place]. Remainder family lives in [place]. Patient works in Engineer, technical sales. Enjoys tennis. Vegetarian.   Social Determinants of Health   Financial Resource Strain: Not on file  Food Insecurity: Not on file  Transportation Needs: Not on file  Physical Activity: Not on file  Stress: Not on file  Social Connections: Not on file     Family History: The patient's family history includes Cancer in his paternal grandmother; Depression in his brother; Diabetes in his mother; GER disease in his paternal grandmother; Heart disease in his mother; Other in his father; Ulcers in his father.  ROS:   Please see the history of present illness.     All other systems reviewed and are negative.  EKGs/Labs/Other Studies Reviewed:    The following studies were reviewed today: Cardiac cath 01/20/22:  Coronary/Graft Acute MI Revascularization  LEFT HEART CATH AND CORONARY ANGIOGRAPHY   Conclusion      Prox LAD lesion is 100% stenosed.   Mid Cx to Dist Cx lesion is 20% stenosed with 20% stenosed side branch in LPAV.   A drug-eluting stent was successfully placed using a SYNERGY XD [id number].   Post intervention, there is a 0% residual stenosis.   LV end diastolic pressure is moderately elevated.   Single vessel occlusive CAD involving the proximal LAD Moderately elevated LVEDP 30 mm Hg Successful PCI of the proximal LAD with DES x 1.   Plan: DAPT for one year.   Diagnostic Dominance: Left  Intervention   Echo 03/29/22: IMPRESSIONS     1. Septal/apical akinesis inferior apical akinesis Mid/baseal inferior  wall hypokinesis . Left ventricular ejection fraction, by estimation, is  30 to 35%. The left ventricle has moderately decreased function. The left  ventricle demonstrates  regional  wall motion abnormalities (see scoring diagram/findings for description).  The left ventricular internal cavity size was moderately dilated. There is  mild asymmetric left ventricular hypertrophy of the basal and septal  segments. Left ventricular diastolic   parameters were normal.   2. Right ventricular systolic function is normal. The right ventricular  size is normal.   3. The mitral valve is abnormal. Mild mitral valve regurgitation. No  evidence of mitral stenosis.   4. The aortic valve is tricuspid. There is mild calcification of the  aortic valve. Aortic valve regurgitation is not visualized. Aortic valve  sclerosis is present, with no evidence of aortic valve stenosis.   5. The inferior vena cava is normal in size with greater than 50%  respiratory variability,  suggesting right atrial pressure of 3 mmHg.    EKG:  EKG is not ordered today.    Recent Labs: 01/21/2022: TSH 1.993 01/22/2022: B Natriuretic Peptide 225.6; Magnesium 2.0 04/12/2022: Hemoglobin 14.3; Platelets 233 05/14/2022: ALT 16; BUN 13; Creatinine, Ser 0.89; Potassium 4.8; Sodium 135  Recent Lipid Panel    Component Value Date/Time   CHOL 99 (L) 02/22/2022 0931   TRIG 82 02/22/2022 0931   HDL 33 (L) 02/22/2022 0931   CHOLHDL 3.0 02/22/2022 0931   CHOLHDL 4.7 01/20/2022 2245   VLDL 27 01/20/2022 2245   LDLCALC 49 02/22/2022 0931   LDLCALC 132 (H) 04/15/2020 1155   LDLDIRECT 110 (H) 04/25/2007 1845     Risk Assessment/Calculations:           STOP-Bang Score:  3       Physical Exam:    VS:  BP (!) 94/58 (BP Location: Left Arm, Patient Position: Sitting, Cuff Size: Normal)   Pulse 68   Ht 5\' 9"  (1.753 m)   Wt 154 lb (69.9 kg)   BMI 22.74 kg/m     Wt Readings from Last 3 Encounters:  05/27/22 154 lb (69.9 kg)  04/22/22 155 lb (70.3 kg)  04/21/22 155 lb 14.4 oz (70.7 kg)     GEN:  Well nourished, well developed in no acute distress HEENT: Normal NECK: No JVD; No carotid  bruits LYMPHATICS: No lymphadenopathy CARDIAC: RRR, no murmurs, rubs, gallops RESPIRATORY:  Clear to auscultation without rales, wheezing or rhonchi  ABDOMEN: Soft, non-tender, non-distended MUSCULOSKELETAL:  No edema; No deformity  SKIN: Warm and dry NEUROLOGIC:  Alert and oriented x 3 PSYCHIATRIC:  Normal affect   ASSESSMENT:    1. Coronary artery disease of native artery of native heart with stable angina pectoris (HCC)   2. Ischemic cardiomyopathy   3. Chronic systolic CHF (congestive heart failure) (HCC)   4. Hyperlipidemia, unspecified hyperlipidemia type     PLAN:    In order of problems listed above:  CAD s/p acute anterior STEMI in July with emergent stenting of the LAD. Troponin > 24,000. Ecg with persistent Q waves. EF 30-35%. On DAPT for one year. Not on beta blocker due to low resting HR. Continue with  cardiac Rehab.  Chronic systolic CHF secondary to ischemic CM. Still trying to optimize medical therapy. Now on Entresto, aldactone and August. Tolerating well but unable to titrate further due to soft BP. We need to reassess LV function now. Will arrange for cardiac MRI.  If EF is still < 35% will need to consider for ICD Hypercholesterolemia. Did not tolerate high dose statin. Now  on Crestor 5 mg daily. Will repeat lipid panel. Prior lipoprotein(a) was 153 so if he is not at goal would consider adding a PCSK 9 inhibitor or Leqvio.  Follow up in 3 months      Signed, Amedee Cerrone [place], MD  05/27/2022 5:00 PM    [facility]

## 2022-05-26 ENCOUNTER — Encounter (HOSPITAL_COMMUNITY)
Admission: RE | Admit: 2022-05-26 | Discharge: 2022-05-26 | Disposition: A | Discharge status: Home / Self Care | Payer: No Typology Code available for payment source | Source: Ambulatory Visit | Attending: Cardiology | Admitting: Cardiology

## 2022-05-26 DIAGNOSIS — Z955 Presence of coronary angioplasty implant and graft: Secondary | ICD-10-CM

## 2022-05-26 DIAGNOSIS — Z5189 Encounter for other specified aftercare: Secondary | ICD-10-CM | POA: Diagnosis not present

## 2022-05-26 DIAGNOSIS — I2102 ST elevation (STEMI) myocardial infarction involving left anterior descending coronary artery: Secondary | ICD-10-CM

## 2022-05-27 ENCOUNTER — Ambulatory Visit: Payer: No Typology Code available for payment source | Admitting: Cardiology

## 2022-05-27 ENCOUNTER — Ambulatory Visit: Payer: No Typology Code available for payment source | Attending: Cardiology | Admitting: Cardiology

## 2022-05-27 ENCOUNTER — Encounter: Payer: Self-pay | Admitting: Cardiology

## 2022-05-27 VITALS — BP 94/58 | HR 68 | Ht 69.0 in | Wt 154.0 lb

## 2022-05-27 DIAGNOSIS — I5022 Chronic systolic (congestive) heart failure: Secondary | ICD-10-CM

## 2022-05-27 DIAGNOSIS — E785 Hyperlipidemia, unspecified: Secondary | ICD-10-CM

## 2022-05-27 DIAGNOSIS — I255 Ischemic cardiomyopathy: Secondary | ICD-10-CM

## 2022-05-27 DIAGNOSIS — I25118 Atherosclerotic heart disease of native coronary artery with other forms of angina pectoris: Secondary | ICD-10-CM

## 2022-05-27 NOTE — Patient Instructions (Addendum)
Medication Instructions:  Continue same medications *If you need a refill on your cardiac medications before your next appointment, please call your pharmacy*   Lab Work: Lipid Panel    Testing/Procedures: Cardiac MRI       Follow-Up: At [facility], you and your health needs are our priority.  As part of our continuing mission to provide you with exceptional heart care, we have created designated Provider Care Teams.  These Care Teams include your primary Cardiologist (physician) and Advanced Practice Providers (APPs -  Physician Assistants and Nurse Practitioners) who all work together to provide you with the care you need, when you need it.  We recommend signing up for the patient portal called "MyChart".  Sign up information is provided on this After Visit Summary.  MyChart is used to connect with patients for Virtual Visits (Telemedicine).  Patients are able to view lab/test results, encounter notes, upcoming appointments, etc.  Non-urgent messages can be sent to your provider as well.   To learn more about what you can do with MyChart, go to [email or web address].    Your next appointment:  3 months    The format for your next appointment: Office    Provider: Dr.Jordan       Your physician has requested that you have a cardiac MRI. Cardiac MRI uses a computer to create images of your heart as its beating, producing both still and moving pictures of your heart and major blood vessels. For further information please visit [email or web address].     Important Information About Sugar

## 2022-05-27 NOTE — Addendum Note (Signed)
Addended by: Neoma Laming on: 05/27/2022 05:16 PM   Modules accepted: Orders

## 2022-05-28 ENCOUNTER — Encounter (HOSPITAL_COMMUNITY)
Admission: RE | Admit: 2022-05-28 | Discharge: 2022-05-28 | Disposition: A | Discharge status: Home / Self Care | Payer: No Typology Code available for payment source | Source: Ambulatory Visit | Attending: Cardiology | Admitting: Cardiology

## 2022-05-28 DIAGNOSIS — Z955 Presence of coronary angioplasty implant and graft: Secondary | ICD-10-CM | POA: Diagnosis not present

## 2022-05-28 DIAGNOSIS — I252 Old myocardial infarction: Secondary | ICD-10-CM | POA: Diagnosis not present

## 2022-05-28 DIAGNOSIS — Z5189 Encounter for other specified aftercare: Secondary | ICD-10-CM | POA: Insufficient documentation

## 2022-05-28 DIAGNOSIS — I2102 ST elevation (STEMI) myocardial infarction involving left anterior descending coronary artery: Secondary | ICD-10-CM

## 2022-05-29 LAB — BASIC METABOLIC PANEL
BUN/Creatinine Ratio: 11 (ref 9–20)
BUN: 10 mg/dL (ref 6–24)
CO2: 25 mmol/L (ref 20–29)
Calcium: 9.5 mg/dL (ref 8.7–10.2)
Chloride: 102 mmol/L (ref 96–106)
Creatinine, Ser: 0.89 mg/dL (ref 0.76–1.27)
Glucose: 89 mg/dL (ref 70–99)
Potassium: 5.1 mmol/L (ref 3.5–5.2)
Sodium: 140 mmol/L (ref 134–144)
eGFR: 100 mL/min/{1.73_m2} (ref >59)

## 2022-05-29 LAB — CBC WITH DIFFERENTIAL/PLATELET
Basophils Absolute: 0.1 10*3/uL (ref 0.0–0.2)
Basos: 1 %
EOS (ABSOLUTE): 0.2 10*3/uL (ref 0.0–0.4)
Eos: 3 %
Hematocrit: 44.4 % (ref 37.5–51.0)
Hemoglobin: 14.8 g/dL (ref 13.0–17.7)
Immature Grans (Abs): 0 10*3/uL (ref 0.0–0.1)
Immature Granulocytes: 0 %
Lymphocytes Absolute: 2.5 10*3/uL (ref 0.7–3.1)
Lymphs: 34 %
MCH: 28.8 pg (ref 26.6–33.0)
MCHC: 33.3 g/dL (ref 31.5–35.7)
MCV: 86 fL (ref 79–97)
Monocytes Absolute: 0.5 10*3/uL (ref 0.1–0.9)
Monocytes: 7 %
Neutrophils Absolute: 4 10*3/uL (ref 1.4–7.0)
Neutrophils: 55 %
Platelets: 260 10*3/uL (ref 150–450)
RBC: 5.14 x10E6/uL (ref 4.14–5.80)
RDW: 12.6 % (ref 11.6–15.4)
WBC: 7.3 10*3/uL (ref 3.4–10.8)

## 2022-05-29 LAB — LIPID PANEL
Chol/HDL Ratio: 3 ratio (ref 0.0–5.0)
Cholesterol, Total: 133 mg/dL (ref 100–199)
HDL: 45 mg/dL (ref >39)
LDL Chol Calc (NIH): 70 mg/dL (ref 0–99)
Triglycerides: 98 mg/dL (ref 0–149)
VLDL Cholesterol Cal: 18 mg/dL (ref 5–40)

## 2022-06-02 ENCOUNTER — Other Ambulatory Visit: Payer: Self-pay

## 2022-06-02 ENCOUNTER — Encounter (HOSPITAL_COMMUNITY)
Admission: RE | Admit: 2022-06-02 | Discharge: 2022-06-02 | Disposition: A | Discharge status: Home / Self Care | Payer: No Typology Code available for payment source | Source: Ambulatory Visit | Attending: Cardiology | Admitting: Cardiology

## 2022-06-02 ENCOUNTER — Telehealth: Payer: Self-pay | Admitting: Cardiology

## 2022-06-02 DIAGNOSIS — E785 Hyperlipidemia, unspecified: Secondary | ICD-10-CM

## 2022-06-02 DIAGNOSIS — Z955 Presence of coronary angioplasty implant and graft: Secondary | ICD-10-CM

## 2022-06-02 DIAGNOSIS — I2102 ST elevation (STEMI) myocardial infarction involving left anterior descending coronary artery: Secondary | ICD-10-CM

## 2022-06-02 DIAGNOSIS — Z5189 Encounter for other specified aftercare: Secondary | ICD-10-CM | POA: Diagnosis not present

## 2022-06-02 DIAGNOSIS — I25118 Atherosclerotic heart disease of native coronary artery with other forms of angina pectoris: Secondary | ICD-10-CM

## 2022-06-02 DIAGNOSIS — R899 Unspecified abnormal finding in specimens from other organs, systems and tissues: Secondary | ICD-10-CM

## 2022-06-02 NOTE — Telephone Encounter (Signed)
Spoke with patient and gave him lab results per Dr. Elvis Coil notes. Explained lipid clinic to patient. Order placed for lipid clinic.   :The following abnormalities are noted:  cholesterol has gone up since hospitalization and probably represents his true steady state. All other values are normal, stable or within acceptable limits. Medication changes / Follow up labs / Other changes or recommendations:   I think since he is high risk I would target LDL < 55. He also has elevated LP(a) so may benefit from one or the injectables. I would refer him to lipid clinic to look into this more."

## 2022-06-02 NOTE — Telephone Encounter (Signed)
Patient is returning call to discuss lab results. 

## 2022-06-02 NOTE — Progress Notes (Signed)
Cardiac Individual Treatment Plan  Patient Details  Name: Michael Gibbs MRN: [id number] Date of Birth: 01/27/1966 Referring Provider:   Flowsheet Row INTENSIVE CARDIAC REHAB ORIENT from 03/25/2022 in [facility] for Heart, Vascular, & [place]  Referring Provider Dr. Peter Martinique MD       Initial Encounter Date:  [place] from 03/25/2022 in [facility] for Heart, Vascular, & Lung Health  Date 03/25/22       Visit Diagnosis: 01/20/22 STEMI  01/20/22 S/P DES Prox LAD  Patient's Home Medications on Admission:  Current Outpatient Medications:    aspirin EC 81 MG tablet, Take 1 tablet (81 mg total) by mouth daily. Swallow whole., Disp: 30 tablet, Rfl: 12   dapagliflozin propanediol (FARXIGA) 10 MG TABS tablet, Take 1 tablet (10 mg total) by mouth daily., Disp: 30 tablet, Rfl: 5   nitroGLYCERIN (NITROSTAT) 0.4 MG SL tablet, Place 1 tablet (0.4 mg total) under the tongue every 5 (five) minutes x 3 doses as needed for chest pain., Disp: 25 tablet, Rfl: 2   rosuvastatin (CRESTOR) 5 MG tablet, Take 1 tablet (5 mg total) by mouth daily., Disp: 90 tablet, Rfl: 3   sacubitril-valsartan (ENTRESTO) 24-26 MG, Take 1 tablet by mouth 2 (two) times daily., Disp: 60 tablet, Rfl: 11   spironolactone (ALDACTONE) 25 MG tablet, Take 1/2 tablet ( 12.5 mg ) daily, Disp: 45 tablet, Rfl: 3   ticagrelor (BRILINTA) 90 MG TABS tablet, Take 1 tablet (90 mg total) by mouth 2 (two) times daily., Disp: 60 tablet, Rfl: 11   triamcinolone cream (KENALOG) 0.1 %, Apply 1 Application topically 2 (two) times daily., Disp: 30 g, Rfl: 1  Past Medical History: Past Medical History:  Diagnosis Date   Allergic state 04/25/2007   Seasonal allergies-well controlled with Allegra and Flonase     CAD (coronary artery disease)    a. 12/2021: s/p STEMI with DES to proximal LAD   Dermatitis 09/22/2011   Headache(784.0)     Headache(784.0) 09/22/2011   HFrEF (heart failure with reduced ejection fraction) ([place])    a. EF 30-35% in 12/2021 in the setting of anterior STEMI   Hyperlipidemia, mixed 08/11/2014   Preventative health care 04/02/2011   Sleep apnea    Sleep apnea     Tobacco Use: Social History   Tobacco Use  Smoking Status Never  Smokeless Tobacco Never    Labs: Review Flowsheet  More data exists      Latest Ref Rng & Units 01/20/2022 02/22/2022 03/05/2022 05/14/2022 05/28/2022  Labs for ITP Cardiac and Pulmonary Rehab  Cholestrol 100 - 199 mg/dL 218  99  - - 133   LDL (calc) 0 - 99 mg/dL 145  49  - - 70   HDL-C >39 mg/dL 46  33  - - 45   Trlycerides 0 - 149 mg/dL 134  82  - - 98   Hemoglobin A1c 4.6 - 6.5 % 5.2  - 5.6  5.6  -  TCO2 22 - 32 mmol/L 20  - - - -    Capillary Blood Glucose: No results found for: "GLUCAP"   Exercise Target Goals: Exercise Program Goal: Individual exercise prescription set using results from initial 6 min walk test and THRR while considering  patient's activity barriers and safety.   Exercise Prescription Goal: Initial exercise prescription builds to 30-45 minutes a day of aerobic activity, 2-3 days per week.  Home exercise guidelines will be given  to patient during program as part of exercise prescription that the participant will acknowledge.  Activity Barriers & Risk Stratification:  Activity Barriers & Cardiac Risk Stratification - 03/25/22 1443       Activity Barriers & Cardiac Risk Stratification   Activity Barriers Back Problems;Neck/Spine Problems    Cardiac Risk Stratification High   under 5 mets on 6MWT            6 Minute Walk:  6 Minute Walk     Row Name 03/25/22 1439         6 Minute Walk   Phase Initial     Distance 1800 feet     Walk Time 6 minutes     # of Rest Breaks 0     MPH 3.41     METS 4.66     RPE 9     Perceived Dyspnea  0     VO2 Peak 16.31     Symptoms No     Resting HR 72 bpm     Resting BP 98/60      Resting Oxygen Saturation  100 %     Exercise Oxygen Saturation  during 6 min walk 100 %     Max Ex. HR 107 bpm     Max Ex. BP 110/72     2 Minute Post BP 110/68              Oxygen Initial Assessment:   Oxygen Re-Evaluation:   Oxygen Discharge (Final Oxygen Re-Evaluation):   Initial Exercise Prescription:  Initial Exercise Prescription - 03/25/22 1400       Date of Initial Exercise RX and Referring Provider   Date 03/25/22    Referring Provider Dr. Peter Martinique MD    Expected Discharge Date 05/28/22      Bike   Level 2.5    Minutes 15    METs 2.2      NuStep   Level 3    SPM 70    Minutes 15    METs 2.2      Prescription Details   Frequency (times per week) 3    Duration Progress to 30 minutes of continuous aerobic without signs/symptoms of physical distress      Intensity   THRR 40-80% of Max Heartrate 65-131    Ratings of Perceived Exertion 11-13    Perceived Dyspnea 0-4      Progression   Progression Continue progressive overload as per policy without signs/symptoms or physical distress.      Resistance Training   Training Prescription Yes    Weight 4    Reps 10-15             Perform Capillary Blood Glucose checks as needed.  Exercise Prescription Changes:   Exercise Prescription Changes     Row Name 04/02/22 1623 05/05/22 1623 05/19/22 1612         Response to Exercise   Blood Pressure (Admit) 94/72 94/79 108/54     Blood Pressure (Exercise) 114/64 98/64 132/56     Blood Pressure (Exit) 110/56 98/72 110/56     Heart Rate (Admit) 77 bpm 88 bpm 73 bpm     Heart Rate (Exercise) 116 bpm 136 bpm 125 bpm     Heart Rate (Exit) 86 bpm 87 bpm 80 bpm     Rating of Perceived Exertion (Exercise) _0 Perceived Dyspnea (Exercise) 0 0 0     Symptoms 0 Low post BP  of 86/60 asymptomatic, recoved to 98/72 after H2O --     Comments Pt first day CRP2 program Reviewed MET's goals and home ExRx Reviewed MET's     Duration Progress to 30  minutes of  aerobic without signs/symptoms of physical distress Progress to 30 minutes of  aerobic without signs/symptoms of physical distress Progress to 30 minutes of  aerobic without signs/symptoms of physical distress     Intensity THRR unchanged THRR unchanged THRR unchanged       Progression   Progression Continue to progress workloads to maintain intensity without signs/symptoms of physical distress. Continue to progress workloads to maintain intensity without signs/symptoms of physical distress. Continue to progress workloads to maintain intensity without signs/symptoms of physical distress.     Average METs 3.85 3.75 3.9       Resistance Training   Training Prescription Yes No No     Weight 4 -- --     Reps 10-15 -- --     Time 10 Minutes -- --       Bike   Level 2.5 2.5 2.5     Minutes _0 METs 5.3 3.8 4.3       NuStep   Level _1 SPM 85 85 85     Minutes _2 METs 2.4 3.7 3.5       Home Exercise Plan   Plans to continue exercise at -- Home (comment) Home (comment)     Frequency -- Add 4 additional days to program exercise sessions. Add 4 additional days to program exercise sessions.     Initial Home Exercises Provided -- 05/05/22 05/05/22              Exercise Comments:   Exercise Comments     Row Name 04/02/22 1630 05/05/22 0828 05/05/22 1634 05/19/22 1616     Exercise Comments Pt first day in the CRP2 program. Pt tolerated Exercise well with an average MET level of 3.85. Pt is learning his THRR, RPE and ExRx. Will review eduation with pt if he attends today. Pt was seen in ER for chest pain recently, waited for a few sessions to insure no recurring angina syptoms before prescribing home exercise guidlines. with an average MET level of 3.75. Pt is already meeting exercise guidlines and walks 2 miles in about 30 mins most days. He is also interested in yoga and plans to join classes soon. Pt was feeling really good about his goals prior to  his ER visit, he has been feeling weaker, but is now back on track. Reviewed MET's with pt today. Pt tolerated Exercise well with an average MET level of 3.9. Pt is doing well with his ExRx and is progressing well in the program             Exercise Goals and Review:   Exercise Goals     Row Name 03/25/22 1446             Exercise Goals   Increase Physical Activity Yes       Intervention Provide advice, education, support and counseling about physical activity/exercise needs.;Develop an individualized exercise prescription for aerobic and resistive training based on initial evaluation findings, risk stratification, comorbidities and participant's personal goals.       Expected Outcomes Short Term: Attend rehab on a regular basis to increase amount of physical activity.;Long Term: Add in home exercise to make exercise  part of routine and to increase amount of physical activity.;Long Term: Exercising regularly at least 3-5 days a week.       Increase Strength and Stamina Yes       Intervention Provide advice, education, support and counseling about physical activity/exercise needs.;Develop an individualized exercise prescription for aerobic and resistive training based on initial evaluation findings, risk stratification, comorbidities and participant's personal goals.       Expected Outcomes Short Term: Increase workloads from initial exercise prescription for resistance, speed, and METs.;Short Term: Perform resistance training exercises routinely during rehab and add in resistance training at home;Long Term: Improve cardiorespiratory fitness, muscular endurance and strength as measured by increased METs and functional capacity (6MWT)       Able to understand and use rate of perceived exertion (RPE) scale Yes       Intervention Provide education and explanation on how to use RPE scale       Expected Outcomes Short Term: Able to use RPE daily in rehab to express subjective intensity  level;Long Term:  Able to use RPE to guide intensity level when exercising independently       Knowledge and understanding of Target Heart Rate Range (THRR) Yes       Intervention Provide education and explanation of THRR including how the numbers were predicted and where they are located for reference       Expected Outcomes Short Term: Able to state/look up THRR;Long Term: Able to use THRR to govern intensity when exercising independently;Short Term: Able to use daily as guideline for intensity in rehab       Understanding of Exercise Prescription Yes       Intervention Provide education, explanation, and written materials on patient's individual exercise prescription       Expected Outcomes Short Term: Able to explain program exercise prescription;Long Term: Able to explain home exercise prescription to exercise independently                Exercise Goals Re-Evaluation :  Exercise Goals Re-Evaluation     Row Name 04/02/22 1628 05/05/22 1628 05/19/22 1615         Exercise Goal Re-Evaluation   Exercise Goals Review Increase Physical Activity;Increase Strength and Stamina;Able to understand and use rate of perceived exertion (RPE) scale;Knowledge and understanding of Target Heart Rate Range (THRR);Understanding of Exercise Prescription Increase Physical Activity;Increase Strength and Stamina;Able to understand and use rate of perceived exertion (RPE) scale;Knowledge and understanding of Target Heart Rate Range (THRR);Understanding of Exercise Prescription Increase Physical Activity;Increase Strength and Stamina;Able to understand and use rate of perceived exertion (RPE) scale;Knowledge and understanding of Target Heart Rate Range (THRR);Understanding of Exercise Prescription     Comments Pt first day in the CRP2 program. Pt tolerated Exercise well with an average MET level of 3.85. Pt is learning his THRR, RPE and ExRx. Reviewed MET's, goals and home ExRx. Pt tolerated Exercise well with an  average MET level of 3.75. Pt is already meeting exercise guidlines and walks 2 miles in about 30 mins most days. He is also interested in yoga and plans to join classes soon. Pt was feeling really good about his goals prior to his ER visit, he has been feeling weaker, but is now back on track. Reviewed MET's with pt today. Pt tolerated Exercise well with an average MET level of 3.9. Pt is doing well with his ExRx and is progressing well in the program     Expected Outcomes Will continue to monitor pt  and progress workloads as tolerated without sign or symptom Will continue to monitor pt and progress workloads as tolerated without sign or symptom. Pt will continue to exercise on his own and gain strength Will continue to monitor pt and progress workloads as tolerated without sign or symptom. Pt will continue to exercise on his own and gain strength              Discharge Exercise Prescription (Final Exercise Prescription Changes):  Exercise Prescription Changes - 05/19/22 1612       Response to Exercise   Blood Pressure (Admit) 108/54    Blood Pressure (Exercise) 132/56    Blood Pressure (Exit) 110/56    Heart Rate (Admit) 73 bpm    Heart Rate (Exercise) 125 bpm    Heart Rate (Exit) 80 bpm    Rating of Perceived Exertion (Exercise) 12    Perceived Dyspnea (Exercise) 0    Comments Reviewed MET's    Duration Progress to 30 minutes of  aerobic without signs/symptoms of physical distress    Intensity THRR unchanged      Progression   Progression Continue to progress workloads to maintain intensity without signs/symptoms of physical distress.    Average METs 3.9      Resistance Training   Training Prescription No      Bike   Level 2.5    Minutes 15    METs 4.3      NuStep   Level 5    SPM 85    Minutes 15    METs 3.5      Home Exercise Plan   Plans to continue exercise at Home (comment)    Frequency Add 4 additional days to program exercise sessions.    Initial Home  Exercises Provided 05/05/22             Nutrition:  Target Goals: Understanding of nutrition guidelines, daily intake of sodium <1575m, cholesterol <2070m calories 30% from fat and 7% or less from saturated fats, daily to have 5 or more servings of fruits and vegetables.  Biometrics:  Pre Biometrics - 03/25/22 1438       Pre Biometrics   Waist Circumference 38.5 inches    Hip Circumference 39 inches    Waist to Hip Ratio 0.99 %    Triceps Skinfold 11 mm    % Body Fat 23.6 %    Grip Strength 12.5 kg    Flexibility 38 in    Single Leg Stand 30 seconds              Nutrition Therapy Plan and Nutrition Goals:  Nutrition Therapy & Goals - 05/31/22 1631       Nutrition Therapy   Diet Heart Healthy Diet      Personal Nutrition Goals   Nutrition Goal Patient to identify strategies for managing cardiovascular risk by attending the weekly [employer] and nutrition courses    Personal Goal #2 Patient to limit sodium intake to <15008mer day    Personal Goal #3 Patient to identify and limit daily intake of saturated fat, trans fat, sodium, and refined carbohydrates    Comments Goals in action. Michael Gibbs continues to attend the [employer] nutrition series. He has implemented many dietary changes including mindfulness of saturated fat intake and continues vegetarian diet. His wife remains very supportive of lifestyle changes. He is down about 3.5# since starting with our program.      Intervention Plan   Intervention Prescribe, educate and counsel regarding  individualized specific dietary modifications aiming towards targeted core components such as weight, hypertension, lipid management, diabetes, heart failure and other comorbidities.;Nutrition handout(s) given to patient.    Expected Outcomes Short Term Goal: Understand basic principles of dietary content, such as calories, fat, sodium, cholesterol and nutrients.;Long Term Goal: Adherence to prescribed nutrition plan.              Nutrition Assessments:  Nutrition Assessments - 04/05/22 1213       Rate Your Plate Scores   Pre Score 63            MEDIFICTS Score Key: ?70 Need to make dietary changes  40-70 Heart Healthy Diet ? 40 Therapeutic Level Cholesterol Diet   Flowsheet Row INTENSIVE CARDIAC REHAB from 04/02/2022 in [facility] for Heart, Vascular, & Lung Health  Picture Your Plate Total Score on Admission 63      Picture Your Plate Scores: <14 Unhealthy dietary pattern with much room for improvement. 41-50 Dietary pattern unlikely to meet recommendations for good health and room for improvement. 51-60 More healthful dietary pattern, with some room for improvement.  >60 Healthy dietary pattern, although there may be some specific behaviors that could be improved.    Nutrition Goals Re-Evaluation:  Nutrition Goals Re-Evaluation     [place] Name 04/02/22 1620 05/03/22 1556 05/31/22 1631         Goals   Current Weight 163 lb 9.3 oz (74.2 kg) 152 lb 1.9 oz (69 kg)  Weight from last attended session on 04/30/2022 153 lb 14.1 oz (69.8 kg)     Comment Cholesterol 99, HDL 33, A1c WNL, AST 50, ALT 61; Atorvastatin was stopped due to elevated due liver enzymes. No new labs available at this time; liver enzymes WNL lipids WNL, LDL 70; however, recommended to achieve LDL <50, lipoproteinA 153.8, liver enzymes WNL     Expected Outcome Goals in action. Michael Gibbs lives at home with his wife and children. He works from home. Both he and his wife do the grocery shopping and cooking. They follow a vegetarian lifestyle; he does enjoy eggs and dairy. They have switched from peanut oil to olive oil in cooking/frying. He also reports some boredom snacking especially in the evenings. Goals in action. Michael Gibbs continues to attend the [employer] and nutrition series. He has implemented many dietary changes including mindfulness of saturated fat intake and continues vegetarian diet.  His wife remains very supportive of lifestyle changes. His liver enzymes are WNL and he was recently started on crestor. He is down 5.3# since starting with our program. Goals in action. Michael Gibbs continues to attend the [employer] and nutrition series. He has implemented many dietary changes including mindfulness of saturated fat intake and continues vegetarian diet. His wife remains very supportive of lifestyle changes. He is down about 3.5# since starting with our program. It was recommended to achieve LDL <50; he will follow-up with the lipid clinic regarding benefit of PCSK9 or Leqvio to aid with further improved LDL control.              Nutrition Goals Re-Evaluation:  Nutrition Goals Re-Evaluation     [place] Name 04/02/22 1620 05/03/22 1556 05/31/22 1631         Goals   Current Weight 163 lb 9.3 oz (74.2 kg) 152 lb 1.9 oz (69 kg)  Weight from last attended session on 04/30/2022 153 lb 14.1 oz (69.8 kg)     Comment Cholesterol 99, HDL 33, A1c WNL, AST  50, ALT 61; Atorvastatin was stopped due to elevated due liver enzymes. No new labs available at this time; liver enzymes WNL lipids WNL, LDL 70; however, recommended to achieve LDL <50, lipoproteinA 153.8, liver enzymes WNL     Expected Outcome Goals in action. Michael Gibbs lives at home with his wife and children. He works from home. Both he and his wife do the grocery shopping and cooking. They follow a vegetarian lifestyle; he does enjoy eggs and dairy. They have switched from peanut oil to olive oil in cooking/frying. He also reports some boredom snacking especially in the evenings. Goals in action. Michael Gibbs continues to attend the [employer] and nutrition series. He has implemented many dietary changes including mindfulness of saturated fat intake and continues vegetarian diet. His wife remains very supportive of lifestyle changes. His liver enzymes are WNL and he was recently started on crestor. He is down 5.3# since starting with our  program. Goals in action. Michael Gibbs continues to attend the [employer] and nutrition series. He has implemented many dietary changes including mindfulness of saturated fat intake and continues vegetarian diet. His wife remains very supportive of lifestyle changes. He is down about 3.5# since starting with our program. It was recommended to achieve LDL <50; he will follow-up with the lipid clinic regarding benefit of PCSK9 or Leqvio to aid with further improved LDL control.              Nutrition Goals Discharge (Final Nutrition Goals Re-Evaluation):  Nutrition Goals Re-Evaluation - 05/31/22 1631       Goals   Current Weight 153 lb 14.1 oz (69.8 kg)    Comment lipids WNL, LDL 70; however, recommended to achieve LDL <50, lipoproteinA 153.8, liver enzymes WNL    Expected Outcome Goals in action. Michael Gibbs continues to attend the [employer] and nutrition series. He has implemented many dietary changes including mindfulness of saturated fat intake and continues vegetarian diet. His wife remains very supportive of lifestyle changes. He is down about 3.5# since starting with our program. It was recommended to achieve LDL <50; he will follow-up with the lipid clinic regarding benefit of PCSK9 or Leqvio to aid with further improved LDL control.             Psychosocial: Target Goals: Acknowledge presence or absence of significant depression and/or stress, maximize coping skills, provide positive support system. Participant is able to verbalize types and ability to use techniques and skills needed for reducing stress and depression.  Initial Review & Psychosocial Screening:  Initial Psych Review & Screening - 03/25/22 1342       Initial Review   Current issues with Current Stress Concerns    Source of Stress Concerns Occupation    Comments Michael Gibbs says he has a highly stressful job. Michael Gibbs plans to work half days on the days he attends cardiac rehab      [place]? Yes   [place] L[place] has his wife and 2 children for support     Barriers   Psychosocial barriers to participate in program The patient should benefit from training in stress management and relaxation.      Screening Interventions   Interventions Encouraged to exercise    Expected Outcomes Long Term Goal: Stressors or current issues are controlled or eliminated.             Quality of Life Scores:  Quality of Life - 03/25/22 1459       Quality of  Life   Select Quality of Life      Quality of Life Scores   Health/Function Pre 19.37 %    Socioeconomic Pre 22.71 %    Psych/Spiritual Pre 22.86 %    Family Pre 22.8 %    GLOBAL Pre 21.28 %            Scores of 19 and below usually indicate a poorer quality of life in these areas.  A difference of  2-3 points is a clinically meaningful difference.  A difference of 2-3 points in the total score of the Quality of Life Index has been associated with significant improvement in overall quality of life, self-image, physical symptoms, and general health in studies assessing change in quality of life.  PHQ-9: Review Flowsheet  More data exists      04/22/2022 04/02/2022 03/11/2022 04/16/2021 04/15/2020  Depression screen PHQ 2/9  Decreased Interest 0 0 0 0 1  Down, Depressed, Hopeless 0 0 0 0 0  PHQ - 2 Score 0 0 0 0 1  Altered sleeping 0 - - 0 -  Tired, decreased energy 0 - - 0 -  Change in appetite 0 - - 0 -  Feeling bad or failure about yourself  0 - - 0 -  Trouble concentrating 0 - - 0 -  Moving slowly or fidgety/restless 0 - - 0 -  Suicidal thoughts 0 - - 0 -  PHQ-9 Score 0 - - 0 -  Difficult doing work/chores Not difficult at all - - - -   Interpretation of Total Score  Total Score Depression Severity:  1-4 = Minimal depression, 5-9 = Mild depression, 10-14 = Moderate depression, 15-19 = Moderately severe depression, 20-27 = Severe depression   Psychosocial Evaluation and Intervention:   Psychosocial  Re-Evaluation:  Psychosocial Re-Evaluation     Row Name 04/02/22 1654 05/05/22 1004 06/02/22 0937         Psychosocial Re-Evaluation   Current issues with Current Stress Concerns Current Stress Concerns Current Stress Concerns     Comments Michael Gibbs did not voice any incrased concerns or stressors on his first day of exercise Michael Gibbs did not voice any incrased concerns or stressors during exercise at intensive cardiac rehab Michael Gibbs continues not to voice any concerns or stressors during exercise at intensive cardiac rehab. Michael Gibbs will complete the program.     Expected Outcomes Michael Gibbs will have drecreased or controlled stress upon completion of intensive cardiac rehab Michael Gibbs will have drecreased or controlled stress upon completion of intensive cardiac rehab Michael Gibbs will have drecreased or controlled stress upon completion of intensive cardiac rehab     Interventions Stress management education;Relaxation education;Encouraged to attend Cardiac Rehabilitation for the exercise Stress management education;Relaxation education;Encouraged to attend Cardiac Rehabilitation for the exercise Stress management education;Relaxation education;Encouraged to attend Cardiac Rehabilitation for the exercise     Continue Psychosocial Services  Follow up required by staff Follow up required by staff No Follow up required       Initial Review   Source of Stress Concerns Occupation Occupation Occupation     Comments Will continue to monitor and offer support as needed Will continue to monitor and offer support as needed Will continue to monitor and offer support as needed              Psychosocial Discharge (Final Psychosocial Re-Evaluation):  Psychosocial Re-Evaluation - 06/02/22 0937       Psychosocial Re-Evaluation   Current issues with Current Stress Concerns    Comments  Michael Gibbs continues not to voice any concerns or stressors during exercise at intensive cardiac rehab. Michael Gibbs will complete the program.     Expected Outcomes Michael Gibbs will have drecreased or controlled stress upon completion of intensive cardiac rehab    Interventions Stress management education;Relaxation education;Encouraged to attend Cardiac Rehabilitation for the exercise    Continue Psychosocial Services  No Follow up required      Initial Review   Source of Stress Concerns Occupation    Comments Will continue to monitor and offer support as needed             Vocational Rehabilitation: Provide vocational rehab assistance to qualifying candidates.   Vocational Rehab Evaluation & Intervention:  Vocational Rehab - 03/25/22 1519       Initial Vocational Rehab Evaluation & Intervention   Assessment shows need for Vocational Rehabilitation No   Michael Gibbs works full time and does not need voational rehab at this time            Education: Education Goals: Education classes will be provided on a weekly basis, covering required topics. Participant will state understanding/return demonstration of topics presented.    Education     Row Name 04/02/22 1500     Education   Cardiac Education Topics Pritikin   Environmental consultant Psychosocial   Psychosocial Workshop New Thoughts, New Behaviors   Instruction Review Code 1- Verbalizes Understanding   Class Start Time 1355   Class Stop Time 1447   Class Time Calculation (min) 52 min    [place] Name 04/08/22 0700     Education   Cardiac Education Topics Pritikin   Financial trader   Weekly Topic Fast Evening Meals   Instruction Review Code 1- Verbalizes Understanding   Class Start Time 1350   Class Stop Time 1440   Class Time Calculation (min) 50 min    [place] Name 04/09/22 1600     Education   Cardiac Education Topics Pritikin   Tax inspector Psychosocial   Psychosocial How Our Thoughts Can Heal Our Hearts    Instruction Review Code 1- Verbalizes Understanding   Class Start Time 1358   Class Stop Time 1436   Class Time Calculation (min) 38 min    [place] Name 04/14/22 1500     Education   Cardiac Education Topics Pritikin   Financial trader   Weekly Topic Adding Flavor - Sodium-Free   Instruction Review Code 1- Verbalizes Understanding   Class Start Time 2947   Class Stop Time 1440   Class Time Calculation (min) 45 min    [place] Name 04/28/22 1500     Education   Cardiac Education Topics Pritikin   Financial trader   Weekly Topic Personalizing Your Pritikin Plate   Instruction Review Code 1- Verbalizes Understanding   Class Start Time 6546   Class Stop Time 1435   Class Time Calculation (min) 40 min    [place] Name 04/30/22 1500     Education   Cardiac Education Topics Pritikin   Academic librarian Psychosocial   Psychosocial Healthy Minds, Bodies, Hearts  Instruction Review Code 1- Verbalizes Understanding   Class Start Time 1409   Class Stop Time 1445   Class Time Calculation (min) 36 min    [place] Name 05/07/22 1500     Education   Cardiac Education Topics Pritikin   Select Core Videos     Core Videos   Educator Dietitian   Select Nutrition   Nutrition Other  Label Reading   Instruction Review Code 1- Verbalizes Understanding   Class Start Time 1400   Class Stop Time 1438   Class Time Calculation (min) 38 min    [place] Name 05/12/22 1600     Education   Cardiac Education Topics [employer]   Educator Dietitian   Weekly Topic Delicious Desserts   Instruction Review Code 1- Verbalizes Understanding   Class Start Time 1345   Class Stop Time 1432   Class Time Calculation (min) 47 min    [place] Name 05/14/22 1500     Education   Cardiac Education Topics Pritikin   Publishing copy Nutrition   Nutrition Calorie Density   Instruction Review Code 1- Verbalizes Understanding   Class Start Time 1355   Class Stop Time 1430   Class Time Calculation (min) 35 min    [place] Name 05/19/22 1500     Education   Cardiac Education Topics Pritikin   Financial trader   Weekly Topic Efficiency Cooking - Meals in a Snap   Instruction Review Code 1- Verbalizes Understanding   Class Start Time 6834   Class Stop Time 1452   Class Time Calculation (min) 57 min    [place] Name 05/26/22 1500     Education   Cardiac Education Topics Pritikin   Financial trader   Weekly Topic Fast and Healthy Breakfasts   Instruction Review Code 1- Verbalizes Understanding   Class Start Time 1400   Class Stop Time 1450   Class Time Calculation (min) 50 min    [place] Name 05/28/22 1400     Education   Cardiac Education Topics Pritikin   Counselling psychologist Education   General Education Hypertension and Heart Disease   Instruction Review Code 1- Verbalizes Understanding   Class Start Time 1358   Class Stop Time 1436   Class Time Calculation (min) 38 min            Core Videos: Exercise    Move It!  Clinical staff conducted group or individual video education with verbal and written material and guidebook.  Patient learns the recommended Pritikin exercise program. Exercise with the goal of living a long, healthy life. Some of the health benefits of exercise include controlled diabetes, healthier blood pressure levels, improved cholesterol levels, improved heart and lung capacity, improved sleep, and better body composition. Everyone should speak with their doctor before starting or changing an exercise routine.  Biomechanical Limitations Clinical staff conducted group or individual video  education with verbal and written material and guidebook.  Patient learns how biomechanical limitations can impact exercise and how we can mitigate and possibly overcome limitations to have an impactful and balanced exercise routine.  Body Composition Clinical staff conducted group or individual video  education with verbal and written material and guidebook.  Patient learns that body composition (ratio of muscle mass to fat mass) is a key component to assessing overall fitness, rather than body weight alone. Increased fat mass, especially visceral belly fat, can put [place] at increased risk for metabolic syndrome, type 2 diabetes, heart disease, and even death. It is recommended to combine diet and exercise (cardiovascular and resistance training) to improve your body composition. Seek guidance from your physician and exercise physiologist before implementing an exercise routine.  Exercise Action Plan Clinical staff conducted group or individual video education with verbal and written material and guidebook.  Patient learns the recommended strategies to achieve and enjoy long-term exercise adherence, including variety, self-motivation, self-efficacy, and positive decision making. Benefits of exercise include fitness, good health, weight management, more energy, better sleep, less stress, and overall well-being.  Medical   Heart Disease Risk Reduction Clinical staff conducted group or individual video education with verbal and written material and guidebook.  Patient learns our heart is our most vital organ as it circulates oxygen, nutrients, white blood cells, and hormones throughout the entire body, and carries waste away. Data supports a plant-based eating plan like the Pritikin Program for its effectiveness in slowing progression of and reversing heart disease. The video provides a number of recommendations to address heart disease.   Metabolic Syndrome and Belly Fat  Clinical staff conducted group  or individual video education with verbal and written material and guidebook.  Patient learns what metabolic syndrome is, how it leads to heart disease, and how one can reverse it and keep it from coming back. You have metabolic syndrome if you have 3 of the following 5 criteria: abdominal obesity, high blood pressure, high triglycerides, low HDL cholesterol, and high blood sugar.  Hypertension and Heart Disease Clinical staff conducted group or individual video education with verbal and written material and guidebook.  Patient learns that high blood pressure, or hypertension, is very common in the [place]. Hypertension is largely due to excessive salt intake, but other important risk factors include being overweight, physical inactivity, drinking too much alcohol, smoking, and not eating enough potassium from fruits and vegetables. High blood pressure is a leading risk factor for heart attack, stroke, congestive heart failure, dementia, kidney failure, and premature death. Long-term effects of excessive salt intake include stiffening of the arteries and thickening of heart muscle and organ damage. Recommendations include ways to reduce hypertension and the risk of heart disease.  Diseases of Our Time - Focusing on Diabetes Clinical staff conducted group or individual video education with verbal and written material and guidebook.  Patient learns why the best way to stop diseases of our time is prevention, through food and other lifestyle changes. Medicine (such as prescription pills and surgeries) is often only a Band-Aid on the problem, not a long-term solution. Most common diseases of our time include obesity, type 2 diabetes, hypertension, heart disease, and cancer. The Pritikin Program is recommended and has been proven to help reduce, reverse, and/or prevent the damaging effects of metabolic syndrome.  Nutrition   Overview of the Pritikin Eating Plan  Clinical staff conducted group or  individual video education with verbal and written material and guidebook.  Patient learns about the [place] for disease risk reduction. The [place] emphasizes a wide variety of unrefined, minimally-processed carbohydrates, like fruits, vegetables, whole grains, and legumes. Go, Caution, and Stop food choices are explained. Plant-based and lean animal proteins are emphasized. Rationale  provided for low sodium intake for blood pressure control, low added sugars for blood sugar stabilization, and low added fats and oils for coronary artery disease risk reduction and weight management.  Calorie Density  Clinical staff conducted group or individual video education with verbal and written material and guidebook.  Patient learns about calorie density and how it impacts the Pritikin Eating Plan. Knowing the characteristics of the food you choose will help you decide whether those foods will lead to weight gain or weight loss, and whether you want to consume more or less of them. Weight loss is usually a side effect of the Pritikin Eating Plan because of its focus on low calorie-dense foods.  Label Reading  Clinical staff conducted group or individual video education with verbal and written material and guidebook.  Patient learns about the Pritikin recommended label reading guidelines and corresponding recommendations regarding calorie density, added sugars, sodium content, and whole grains.  Dining Out - Part 1  Clinical staff conducted group or individual video education with verbal and written material and guidebook.  Patient learns that restaurant meals can be sabotaging because they can be so high in calories, fat, sodium, and/or sugar. Patient learns recommended strategies on how to positively address this and avoid unhealthy pitfalls.  Facts on Fats  Clinical staff conducted group or individual video education with verbal and written material and guidebook.  Patient learns  that lifestyle modifications can be just as effective, if not more so, as many medications for lowering your risk of heart disease. A Pritikin lifestyle can help to reduce your risk of inflammation and atherosclerosis (cholesterol build-up, or plaque, in the artery walls). Lifestyle interventions such as dietary choices and physical activity address the cause of atherosclerosis. A review of the types of fats and their impact on blood cholesterol levels, along with dietary recommendations to reduce fat intake is also included.  Nutrition Action Plan  Clinical staff conducted group or individual video education with verbal and written material and guidebook.  Patient learns how to incorporate Pritikin recommendations into their lifestyle. Recommendations include planning and keeping personal health goals in mind as an important part of their success.  Healthy Mind-Set    Healthy Minds, Bodies, Hearts  Clinical staff conducted group or individual video education with verbal and written material and guidebook.  Patient learns how to identify when they are stressed. Video will discuss the impact of that stress, as well as the many benefits of stress management. Patient will also be introduced to stress management techniques. The way we think, act, and feel has an impact on our hearts.  How Our Thoughts Can Heal Our Hearts  Clinical staff conducted group or individual video education with verbal and written material and guidebook.  Patient learns that negative thoughts can cause depression and anxiety. This can result in negative lifestyle behavior and serious health problems. Cognitive behavioral therapy is an effective method to help control our thoughts in order to change and improve our emotional outlook.  Additional Videos:  Exercise    Improving Performance  Clinical staff conducted group or individual video education with verbal and written material and guidebook.  Patient learns to use a  non-linear approach by alternating intensity levels and lengths of time spent exercising to help burn more calories and lose more body fat. Cardiovascular exercise helps improve heart health, metabolism, hormonal balance, blood sugar control, and recovery from fatigue. Resistance training improves strength, endurance, balance, coordination, reaction time, metabolism, and muscle mass. Flexibility exercise improves  circulation, posture, and balance. Seek guidance from your physician and exercise physiologist before implementing an exercise routine and learn your capabilities and proper form for all exercise.  Introduction to Yoga  Clinical staff conducted group or individual video education with verbal and written material and guidebook.  Patient learns about yoga, a discipline of the coming together of mind, breath, and body. The benefits of yoga include improved flexibility, improved range of motion, better posture and core strength, increased lung function, weight loss, and positive self-image. Yoga's heart health benefits include lowered blood pressure, healthier heart rate, decreased cholesterol and triglyceride levels, improved immune function, and reduced stress. Seek guidance from your physician and exercise physiologist before implementing an exercise routine and learn your capabilities and proper form for all exercise.  Medical   Aging: Enhancing Your Quality of Life  Clinical staff conducted group or individual video education with verbal and written material and guidebook.  Patient learns key strategies and recommendations to stay in good physical health and enhance quality of life, such as prevention strategies, having an advocate, securing a [place], and keeping a list of medications and system for tracking them. It also discusses how to avoid risk for bone loss.  Biology of Weight Control  Clinical staff conducted group or individual video education with  verbal and written material and guidebook.  Patient learns that weight gain occurs because we consume more calories than we burn (eating more, moving less). Even if your body weight is normal, you may have higher ratios of fat compared to muscle mass. Too much body fat puts you at increased risk for cardiovascular disease, heart attack, stroke, type 2 diabetes, and obesity-related cancers. In addition to exercise, following the [place] can help reduce your risk.  Decoding Lab Results  Clinical staff conducted group or individual video education with verbal and written material and guidebook.  Patient learns that lab test reflects one measurement whose values change over time and are influenced by many factors, including medication, stress, sleep, exercise, food, hydration, pre-existing medical conditions, and more. It is recommended to use the knowledge from this video to become more involved with your lab results and evaluate your numbers to speak with your doctor.   Diseases of Our Time - Overview  Clinical staff conducted group or individual video education with verbal and written material and guidebook.  Patient learns that according to the CDC, 50% to 70% of chronic diseases (such as obesity, type 2 diabetes, elevated lipids, hypertension, and heart disease) are avoidable through lifestyle improvements including healthier food choices, listening to satiety cues, and increased physical activity.  Sleep Disorders Clinical staff conducted group or individual video education with verbal and written material and guidebook.  Patient learns how good quality and duration of sleep are important to overall health and well-being. Patient also learns about sleep disorders and how they impact health along with recommendations to address them, including discussing with a physician.  Nutrition  Dining Out - Part 2 Clinical staff conducted group or individual video education with verbal and  written material and guidebook.  Patient learns how to plan ahead and communicate in order to maximize their dining experience in a healthy and nutritious manner. Included are recommended food choices based on the type of restaurant the patient is visiting.   Fueling a Best boy conducted group or individual video education with verbal and written material and guidebook.  There is a strong connection between  our food choices and our health. Diseases like obesity and type 2 diabetes are very prevalent and are in large-part due to lifestyle choices. The Pritikin Eating Plan provides plenty of food and hunger-curbing satisfaction. It is easy to follow, affordable, and helps reduce health risks.  Menu Workshop  Clinical staff conducted group or individual video education with verbal and written material and guidebook.  Patient learns that restaurant meals can sabotage health goals because they are often packed with calories, fat, sodium, and sugar. Recommendations include strategies to plan ahead and to communicate with the manager, chef, or server to help order a healthier meal.  Planning Your Eating Strategy  Clinical staff conducted group or individual video education with verbal and written material and guidebook.  Patient learns about the [place] and its benefit of reducing the risk of disease. The [place] does not focus on calories. Instead, it emphasizes high-quality, nutrient-rich foods. By knowing the characteristics of the foods, we choose, we can determine their calorie density and make informed decisions.  Targeting Your Nutrition Priorities  Clinical staff conducted group or individual video education with verbal and written material and guidebook.  Patient learns that lifestyle habits have a tremendous impact on disease risk and progression. This video provides eating and physical activity recommendations based on your personal health goals,  such as reducing LDL cholesterol, losing weight, preventing or controlling type 2 diabetes, and reducing high blood pressure.  Vitamins and Minerals  Clinical staff conducted group or individual video education with verbal and written material and guidebook.  Patient learns different ways to obtain key vitamins and minerals, including through a recommended healthy diet. It is important to discuss all supplements you take with your doctor.   Healthy Mind-Set    Smoking Cessation  Clinical staff conducted group or individual video education with verbal and written material and guidebook.  Patient learns that cigarette smoking and tobacco addiction pose a serious health risk which affects millions of people. Stopping smoking will significantly reduce the risk of heart disease, lung disease, and many forms of cancer. Recommended strategies for quitting are covered, including working with your doctor to develop a successful plan.  Culinary   Becoming a Financial trader conducted group or individual video education with verbal and written material and guidebook.  Patient learns that cooking at home can be healthy, cost-effective, quick, and puts them in control. Keys to cooking healthy recipes will include looking at your recipe, assessing your equipment needs, planning ahead, making it simple, choosing cost-effective seasonal ingredients, and limiting the use of added fats, salts, and sugars.  Cooking - Breakfast and Snacks  Clinical staff conducted group or individual video education with verbal and written material and guidebook.  Patient learns how important breakfast is to satiety and nutrition through the entire day. Recommendations include key foods to eat during breakfast to help stabilize blood sugar levels and to prevent overeating at meals later in the day. Planning ahead is also a key component.  Cooking - Human resources officer conducted group or individual video  education with verbal and written material and guidebook.  Patient learns eating strategies to improve overall health, including an approach to cook more at home. Recommendations include thinking of animal protein as a side on your plate rather than center stage and focusing instead on lower calorie dense options like vegetables, fruits, whole grains, and plant-based proteins, such as beans. Making sauces in large quantities to freeze for  later and leaving the skin on your vegetables are also recommended to maximize your experience.  Cooking - Healthy Salads and Dressing Clinical staff conducted group or individual video education with verbal and written material and guidebook.  Patient learns that vegetables, fruits, whole grains, and legumes are the foundations of the [place]. Recommendations include how to incorporate each of these in flavorful and healthy salads, and how to create homemade salad dressings. Proper handling of ingredients is also covered. Cooking - Soups and [employer] - Soups and Desserts Clinical staff conducted group or individual video education with verbal and written material and guidebook.  Patient learns that Pritikin soups and desserts make for easy, nutritious, and delicious snacks and meal components that are low in sodium, fat, sugar, and calorie density, while high in vitamins, minerals, and filling fiber. Recommendations include simple and healthy ideas for soups and desserts.   Overview     The Pritikin Solution Program Overview Clinical staff conducted group or individual video education with verbal and written material and guidebook.  Patient learns that the results of the [place] Program have been documented in more than 100 articles published in peer-reviewed journals, and the benefits include reducing risk factors for (and, in some cases, even reversing) high cholesterol, high blood pressure, type 2 diabetes, obesity, and more! An overview of  the three key pillars of the Pritikin Program will be covered: eating well, doing regular exercise, and having a healthy mind-set.  WORKSHOPS  Exercise: Exercise Basics: Building Your Action Plan Clinical staff led group instruction and group discussion with PowerPoint presentation and patient guidebook. To enhance the learning environment the use of posters, models and videos may be added. At the conclusion of this workshop, patients will comprehend the difference between physical activity and exercise, as well as the benefits of incorporating both, into their routine. Patients will understand the FITT (Frequency, Intensity, Time, and Type) principle and how to use it to build an exercise action plan. In addition, safety concerns and other considerations for exercise and cardiac rehab will be addressed by the presenter. The purpose of this lesson is to promote a comprehensive and effective weekly exercise routine in order to improve patients' overall level of fitness.   Managing Heart Disease: Your Path to a Healthier Heart Clinical staff led group instruction and group discussion with PowerPoint presentation and patient guidebook. To enhance the learning environment the use of posters, models and videos may be added.At the conclusion of this workshop, patients will understand the anatomy and physiology of the heart. Additionally, they will understand how Pritikin's three pillars impact the risk factors, the progression, and the management of heart disease.  The purpose of this lesson is to provide a high-level overview of the heart, heart disease, and how the Pritikin lifestyle positively impacts risk factors.  Exercise Biomechanics Clinical staff led group instruction and group discussion with PowerPoint presentation and patient guidebook. To enhance the learning environment the use of posters, models and videos may be added. Patients will learn how the structural parts of their bodies  function and how these functions impact their daily activities, movement, and exercise. Patients will learn how to promote a neutral spine, learn how to manage pain, and identify ways to improve their physical movement in order to promote healthy living. The purpose of this lesson is to expose patients to common physical limitations that impact physical activity. Participants will learn practical ways to adapt and manage aches and pains, and to minimize their  effect on regular exercise. Patients will learn how to maintain good posture while sitting, walking, and lifting.  Balance Training and Fall Prevention  Clinical staff led group instruction and group discussion with PowerPoint presentation and patient guidebook. To enhance the learning environment the use of posters, models and videos may be added. At the conclusion of this workshop, patients will understand the importance of their sensorimotor skills (vision, proprioception, and the vestibular system) in maintaining their ability to balance as they age. Patients will apply a variety of balancing exercises that are appropriate for their current level of function. Patients will understand the common causes for poor balance, possible solutions to these problems, and ways to modify their physical environment in order to minimize their fall risk. The purpose of this lesson is to teach patients about the importance of maintaining balance as they age and ways to minimize their risk of falling.  WORKSHOPS   Nutrition:  Fueling a Scientist, research (physical sciences) led group instruction and group discussion with PowerPoint presentation and patient guidebook. To enhance the learning environment the use of posters, models and videos may be added. Patients will review the foundational principles of the [place] and understand what constitutes a serving size in each of the food groups. Patients will also learn Pritikin-friendly foods that are better  choices when away from home and review make-ahead meal and snack options. Calorie density will be reviewed and applied to three nutrition priorities: weight maintenance, weight loss, and weight gain. The purpose of this lesson is to reinforce (in a group setting) the key concepts around what patients are recommended to eat and how to apply these guidelines when away from home by planning and selecting Pritikin-friendly options. Patients will understand how calorie density may be adjusted for different weight management goals.  Mindful Eating  Clinical staff led group instruction and group discussion with PowerPoint presentation and patient guidebook. To enhance the learning environment the use of posters, models and videos may be added. Patients will briefly review the concepts of the [place] and the importance of low-calorie dense foods. The concept of mindful eating will be introduced as well as the importance of paying attention to internal hunger signals. Triggers for non-hunger eating and techniques for dealing with triggers will be explored. The purpose of this lesson is to provide patients with the opportunity to review the basic principles of the [place], discuss the value of eating mindfully and how to measure internal cues of hunger and fullness using the Hunger Scale. Patients will also discuss reasons for non-hunger eating and learn strategies to use for controlling emotional eating.  Targeting Your Nutrition Priorities Clinical staff led group instruction and group discussion with PowerPoint presentation and patient guidebook. To enhance the learning environment the use of posters, models and videos may be added. Patients will learn how to determine their genetic susceptibility to disease by reviewing their family history. Patients will gain insight into the importance of diet as part of an overall healthy lifestyle in mitigating the impact of genetics and other  environmental insults. The purpose of this lesson is to provide patients with the opportunity to assess their personal nutrition priorities by looking at their family history, their own health history and current risk factors. Patients will also be able to discuss ways of prioritizing and modifying the [place] for their highest risk areas  Menu  Clinical staff led group instruction and group discussion with PowerPoint presentation and patient guidebook. To  enhance the learning environment the use of posters, models and videos may be added. Using menus brought in from [employer], or printed from [employer], patients will apply the [place] dining out guidelines that were presented in the [employer] video. Patients will also be able to practice these guidelines in a variety of provided scenarios. The purpose of this lesson is to provide patients with the opportunity to practice hands-on learning of the [place] with actual menus and practice scenarios.  Label Reading Clinical staff led group instruction and group discussion with PowerPoint presentation and patient guidebook. To enhance the learning environment the use of posters, models and videos may be added. Patients will review and discuss the Pritikin label reading guidelines presented in Pritikin's Label Reading Educational series video. Using fool labels brought in from local grocery stores and markets, patients will apply the label reading guidelines and determine if the packaged food meet the Pritikin guidelines. The purpose of this lesson is to provide patients with the opportunity to review, discuss, and practice hands-on learning of the Pritikin Label Reading guidelines with actual packaged food labels. [place] Workshops are designed to teach patients ways to prepare quick, simple, and affordable recipes at home. The importance of nutrition's role in  chronic disease risk reduction is reflected in its emphasis in the overall Pritikin program. By learning how to prepare essential core Pritikin Eating Plan recipes, patients will increase control over what they eat; be able to customize the flavor of foods without the use of added salt, sugar, or fat; and improve the quality of the food they consume. By learning a set of core recipes which are easily assembled, quickly prepared, and affordable, patients are more likely to prepare more healthy foods at home. These workshops focus on convenient breakfasts, simple entres, side dishes, and desserts which can be prepared with minimal effort and are consistent with nutrition recommendations for cardiovascular risk reduction. Cooking [employer] are taught by a Engineer, materials (RD) who has been trained by the [employer]. The chef or RD has a clear understanding of the importance of minimizing - if not completely eliminating - added fat, sugar, and sodium in recipes. Throughout the series of [place] Workshop sessions, patients will learn about healthy ingredients and efficient methods of cooking to build confidence in their capability to prepare    Cooking School weekly topics:  Adding Flavor- Sodium-Free  Fast and Healthy Breakfasts  Powerhouse Plant-Based Proteins  Satisfying Salads and Dressings  Simple Sides and Sauces  International Cuisine-Spotlight on the [place] Zones  Delicious Desserts  Savory Soups  Efficiency Cooking - Meals in a Snap  Tasty Appetizers and Snacks  Comforting Weekend Breakfasts  One-Pot Wonders   Fast Evening Meals  Easy [place] (Psychosocial): New Thoughts, New Behaviors Clinical staff led group instruction and group discussion with PowerPoint presentation and patient guidebook. To enhance the learning environment the use of posters, models and videos may be added.  Patients will learn and practice techniques for developing effective health and lifestyle goals. Patients will be able to effectively apply the goal setting process learned to develop at least one new personal goal.  The purpose of this lesson is to expose patients to a new skill set of behavior modification techniques such as techniques setting SMART goals, overcoming barriers, and achieving new thoughts and new behaviors.  Managing Moods  and Relationships Clinical staff led group instruction and group discussion with PowerPoint presentation and patient guidebook. To enhance the learning environment the use of posters, models and videos may be added. Patients will learn how emotional and chronic stress factors can impact their health and relationships. They will learn healthy ways to manage their moods and utilize positive coping mechanisms. In addition, ICR patients will learn ways to improve communication skills. The purpose of this lesson is to expose patients to ways of understanding how one's mood and health are intimately connected. Developing a healthy outlook can help build positive relationships and connections with others. Patients will understand the importance of utilizing effective communication skills that include actively listening and being heard. They will learn and understand the importance of the "4 Cs" and especially Connections in fostering of a Healthy Mind-Set.  Healthy Sleep for a Healthy Heart Clinical staff led group instruction and group discussion with PowerPoint presentation and patient guidebook. To enhance the learning environment the use of posters, models and videos may be added. At the conclusion of this workshop, patients will be able to demonstrate knowledge of the importance of sleep to overall health, well-being, and quality of life. They will understand the symptoms of, and treatments for, common sleep disorders. Patients will also be able to identify daytime and  nighttime behaviors which impact sleep, and they will be able to apply these tools to help manage sleep-related challenges. The purpose of this lesson is to provide patients with a general overview of sleep and outline the importance of quality sleep. Patients will learn about a few of the most common sleep disorders. Patients will also be introduced to the concept of "sleep hygiene," and discover ways to self-manage certain sleeping problems through simple daily behavior changes. Finally, the workshop will motivate patients by clarifying the links between quality sleep and their goals of heart-healthy living.   Recognizing and Reducing Stress Clinical staff led group instruction and group discussion with PowerPoint presentation and patient guidebook. To enhance the learning environment the use of posters, models and videos may be added. At the conclusion of this workshop, patients will be able to understand the types of stress reactions, differentiate between acute and chronic stress, and recognize the impact that chronic stress has on their health. They will also be able to apply different coping mechanisms, such as reframing negative self-talk. Patients will have the opportunity to practice a variety of stress management techniques, such as deep abdominal breathing, progressive muscle relaxation, and/or guided imagery.  The purpose of this lesson is to educate patients on the role of stress in their lives and to provide healthy techniques for coping with it.  Learning Barriers/Preferences:  Learning Barriers/Preferences - 03/25/22 1500       Learning Barriers/Preferences   Learning Barriers Sight;Exercise Concerns   some dizziness, wears glasses   Learning Preferences Computer/Internet;Individual Instruction;Group Instruction;Pictoral;Written Material             Education Topics:  Knowledge Questionnaire Score:  Knowledge Questionnaire Score - 03/25/22 1501       Knowledge Questionnaire  Score   Pre Score 22/24             Core Components/Risk Factors/Patient Goals at Admission:  Personal Goals and Risk Factors at Admission - 03/25/22 1502       Core Components/Risk Factors/Patient Goals on Admission    Weight Management Weight Maintenance;Yes    Intervention Weight Management: Provide education and appropriate resources to help participant work on and attain  dietary goals.;Weight Management: Develop a combined nutrition and exercise program designed to reach desired caloric intake, while maintaining appropriate intake of nutrient and fiber, sodium and fats, and appropriate energy expenditure required for the weight goal.    Expected Outcomes Weight Maintenance: Understanding of the daily nutrition guidelines, which includes 25-35% calories from fat, 7% or less cal from saturated fats, less than 219m cholesterol, less than 1.5gm of sodium, & 5 or more servings of fruits and vegetables daily;Understanding recommendations for meals to include 15-35% energy as protein, 25-35% energy from fat, 35-60% energy from carbohydrates, less than 2049mof dietary cholesterol, 20-35 gm of total fiber daily;Understanding of distribution of calorie intake throughout the day with the consumption of 4-5 meals/snacks    Lipids Yes    Intervention Provide education and support for participant on nutrition & aerobic/resistive exercise along with prescribed medications to achieve LDL <703mHDL >72m22m  Expected Outcomes Short Term: Participant states understanding of desired cholesterol values and is compliant with medications prescribed. Participant is following exercise prescription and nutrition guidelines.;Long Term: Cholesterol controlled with medications as prescribed, with individualized exercise RX and with personalized nutrition plan. Value goals: LDL < 70mg73mL > 40 mg.    Stress Yes    Intervention Offer individual and/or small group education and counseling on adjustment to heart  disease, stress management and health-related lifestyle change. Teach and support self-help strategies.;Refer participants experiencing significant psychosocial distress to appropriate mental health specialists for further evaluation and treatment. When possible, include family members and significant others in education/counseling sessions.    Expected Outcomes Short Term: Participant demonstrates changes in health-related behavior, relaxation and other stress management skills, ability to obtain effective social support, and compliance with psychotropic medications if prescribed.;Long Term: Emotional wellbeing is indicated by absence of clinically significant psychosocial distress or social isolation.    Personal Goal Other Yes    Personal Goal Short: get back on his feet Long: inc. strength    Intervention Will continue to monitor pt and progress workloads as tolerated without sign or symptom    Expected Outcomes Pt will achieve his goals and gain strength.             Core Components/Risk Factors/Patient Goals Review:   Goals and Risk Factor Review     Row Name 04/02/22 1656 05/05/22 1005 06/02/22 0941         Core Components/Risk Factors/Patient Goals Review   Personal Goals Review Weight Management/Obesity;Lipids;Stress Weight Management/Obesity;Lipids;Stress Weight Management/Obesity;Lipids;Stress     Review Michael Gibbs started cardiac rehab on 04/02/22 and did well with exercise, vital signs stable Michael Gibbs has been doing well with exercise. Systolic BP's has been in the 90's at times. Asymptomatic. Michael Gibbs has lost 2.4 kg since starting cardiac rehab. Will continue to monitor BP Michael Gibbs continues to do well with exercise at intensive cardiac rehab. Michael Gibbs will complete intensvie cardiac rehab on 06/09/22     Expected Outcomes Michael Gibbs will continue to participate in intensive cardiac rehab for exercise, nutrition and lifestyle modifications Michael Gibbs will continue to participate in intensive  cardiac rehab for exercise, nutrition and lifestyle modifications Michael Gibbs will continue to participate in intensive cardiac rehab for exercise, nutrition and lifestyle modifications              Core Components/Risk Factors/Patient Goals at Discharge (Final Review):   Goals and Risk Factor Review - 06/02/22 0941       Core Components/Risk Factors/Patient Goals Review   Personal Goals Review Weight Management/Obesity;Lipids;Stress    Review  Michael Gibbs continues to do well with exercise at intensive cardiac rehab. Michael Gibbs will complete intensvie cardiac rehab on 06/09/22    Expected Outcomes Michael Gibbs will continue to participate in intensive cardiac rehab for exercise, nutrition and lifestyle modifications             ITP Comments:  ITP Comments     Row Name 03/25/22 1339 04/05/22 0842 05/05/22 1003       ITP Comments Dr Fransico Him MD, Medical Director, Introduction to Pritikin Education Program/ Intensive Cardiac Rehab. Initial Orientation Packet Reviewed with the patient 30 Day ITP Review. Michael Gibbs started intensive cardiac rehab on 04/02/22 and did well with exercise. 30 Day ITP Review. Michael Gibbs has good attendance and participation in intensive cardiac rehab.              Comments: See ITP comments.Harrell Gave RN BSN

## 2022-06-04 ENCOUNTER — Encounter: Payer: Self-pay | Admitting: Cardiology

## 2022-06-04 ENCOUNTER — Encounter (HOSPITAL_COMMUNITY)
Admission: RE | Admit: 2022-06-04 | Discharge: 2022-06-04 | Disposition: A | Discharge status: Home / Self Care | Payer: No Typology Code available for payment source | Source: Ambulatory Visit | Attending: Cardiology | Admitting: Cardiology

## 2022-06-04 DIAGNOSIS — I2102 ST elevation (STEMI) myocardial infarction involving left anterior descending coronary artery: Secondary | ICD-10-CM

## 2022-06-04 DIAGNOSIS — Z5189 Encounter for other specified aftercare: Secondary | ICD-10-CM | POA: Diagnosis not present

## 2022-06-04 DIAGNOSIS — Z955 Presence of coronary angioplasty implant and graft: Secondary | ICD-10-CM

## 2022-06-07 NOTE — Telephone Encounter (Signed)
Patient stated he had blood in his stool over the weekend ( bright red) and a clot produced with a cough. He had a "smear of blood yesterday" and no signs of bleeding at all today. He is taking ASA 81mg  daily and brilinta 90mg  twice daily.

## 2022-06-09 ENCOUNTER — Encounter (HOSPITAL_COMMUNITY)
Admission: RE | Admit: 2022-06-09 | Discharge: 2022-06-09 | Disposition: A | Discharge status: Home / Self Care | Payer: No Typology Code available for payment source | Source: Ambulatory Visit | Attending: Cardiology | Admitting: Cardiology

## 2022-06-09 VITALS — Ht 68.75 in | Wt 154.3 lb

## 2022-06-09 DIAGNOSIS — Z955 Presence of coronary angioplasty implant and graft: Secondary | ICD-10-CM

## 2022-06-09 DIAGNOSIS — I2102 ST elevation (STEMI) myocardial infarction involving left anterior descending coronary artery: Secondary | ICD-10-CM

## 2022-06-09 DIAGNOSIS — Z5189 Encounter for other specified aftercare: Secondary | ICD-10-CM | POA: Diagnosis not present

## 2022-06-10 ENCOUNTER — Ambulatory Visit: Payer: No Typology Code available for payment source | Admitting: Physician Assistant

## 2022-06-10 ENCOUNTER — Encounter: Payer: Self-pay | Admitting: Physician Assistant

## 2022-06-10 VITALS — BP 108/62 | Temp 82.0°F | Ht 69.0 in | Wt 154.1 lb

## 2022-06-10 DIAGNOSIS — Z7901 Long term (current) use of anticoagulants: Secondary | ICD-10-CM | POA: Diagnosis not present

## 2022-06-10 DIAGNOSIS — K602 Anal fissure, unspecified: Secondary | ICD-10-CM | POA: Diagnosis not present

## 2022-06-10 DIAGNOSIS — I5020 Unspecified systolic (congestive) heart failure: Secondary | ICD-10-CM | POA: Diagnosis not present

## 2022-06-10 DIAGNOSIS — K625 Hemorrhage of anus and rectum: Secondary | ICD-10-CM | POA: Diagnosis not present

## 2022-06-10 MED ORDER — DILTIAZEM GEL 2 %: 1.0000 | Freq: Three times a day (TID) | CUTANEOUS | 0 refills | Status: DC

## 2022-06-10 NOTE — Patient Instructions (Signed)
Continue colace or dulcolax daily     We have sent a prescription for Diltiazem 2% gel to [facility] for you. Using your index finger, you should apply a small amount of medication inside the rectum up to your first knuckle/joint 3 times daily x 6-8 weeks.  [facility]'s information is below: Address: [address], [place], [place] [address]  [phone]  *Please DO NOT go directly from our office to pick up this medication! Give the pharmacy 1 day to process the prescription as this is compounded and takes time to make.   If you are age 57 or older, your body mass index should be between 23-30. Your Body mass index is 22.76 kg/m. If this is out of the aforementioned range listed, please consider follow up with your Primary Care Provider  If you are age 24 or younger, your body mass index should be between 19-25. Your Body mass index is 22.76 kg/m. If this is out of the aformentioned range listed, please consider follow up with your Primary Care Provider.   ________________________________________________________  The [facility] providers would like to encourage you to use [facility] to communicate with providers for non-urgent requests or questions.  Due to long hold times on the telephone, sending your provider a message by [facility] may be a faster and more efficient way to get a response.  Please allow 48 business hours for a response.  Please remember that this is for non-urgent requests.       How to Take a [employer] A sitz bath is a warm water bath that may be used to care for your rectum, genital area, or the area between your rectum and genitals (perineum). In a sitz bath, the water only comes up to your hips and covers your buttocks. A sitz bath may be done in a bathtub or with a portable sitz bath that fits over the toilet. Your health care provider may recommend a sitz bath to help: Relieve pain and discomfort after delivering a baby. Relieve pain and  itching from hemorrhoids or anal fissures. Relieve pain after certain surgeries. Relax muscles that are sore or tight. How to take a sitz bath Take 2-4 sitz baths a day, or as many as told by your health care provider. Bathtub sitz bath To take a sitz bath in a bathtub: Partially fill a bathtub with warm water. The water should be deep enough to cover your hips and buttocks when you are sitting in the bathtub. Follow your health care provider's instructions if you are told to put medicine in the water. Sit in the water. Open the bathtub drain a little, and leave it open during your bath. Turn on the warm water again, enough to replace the water that is draining out. Keep the water running throughout your bath. This helps keep the water at the right level and temperature. Soak in the water for 15-20 minutes, or as long as told by your health care provider. When you are done, be careful when you stand up. You may feel dizzy. After the sitz bath, pat yourself dry. Do not rub your skin to dry it.  Over-the-toilet sitz bath To take a sitz bath with an over-the-toilet basin: Follow the manufacturer's instructions. Fill the basin with warm water. Follow your health care provider's instructions if you were told to put medicine in the water. Sit on the seat. Make sure the water covers your buttocks and perineum. Soak in the water for 15-20  minutes, or as long as told by your health care provider. After the sitz bath, pat yourself dry. Do not rub your skin to dry it. Clean and dry the basin between uses. Discard the basin if it cracks, or according to the manufacturer's instructions.  Contact a health care provider if: Your pain or itching gets worse. Stop doing sitz baths if your symptoms get worse. You have new symptoms. Stop doing sitz baths until you talk with your health care provider. Summary A sitz bath is a warm water bath in which the water only comes up to your hips and covers your  buttocks. Your health care provider may recommend a sitz bath to help relieve pain and discomfort after delivering a baby, relieve pain and itching from hemorrhoids or anal fissures, relieve pain after certain surgeries, or help to relax muscles that are sore or tight. Take 2-4 sitz baths a day, or as many as told by your health care provider. Soak in the water for 15-20 minutes. Stop doing sitz baths if your symptoms get worse. This information is not intended to replace advice given to you by your health care provider. Make sure you discuss any questions you have with your health care provider. Document Revised: 09/15/2021 Document Reviewed: 09/15/2021 Elsevier Patient Education  2023 [employer] Inc.  Thank you for entrusting me with your care and choosing [facility].  Hyacinth Meeker PA-C

## 2022-06-10 NOTE — Progress Notes (Signed)
Chief Complaint: Discuss colonoscopy on chronic anticoagulation  HPI:    Michael Gibbs is a 56 year old male with a past medical history of CAD status post STEMI with DES to proximal LAD 12/2021 on Brilinta, who was referred to me by Bradd Canary, MD for a consideration of a colonoscopy on chronic anticoagulation.    04/21/2020 Cologuard was negative.    03/11/2022 fecal occult negative.    05/27/2022 patient followed with Dr. Swaziland his cardiologist in regards to his history of CAD.  He had been admitted in July 2023 with a STEMI.  He underwent successful PCI with DES to LAD and echo showed EF of 30-35%.  He was started on dual antiplatelet therapy at that time.  Repeat echo 03/29/2022 showed EF remaining around 30 to 35%.    06/04/2022 patient contacted his PCP in regards to seeing some bright red blood in his stool.    Today, patient presents to clinic and tells me that he was planning on having a colonoscopy this year because he has never had one, but then he had all of his heart difficulty.  He has had screening for colorectal cancer with a Cologuard which he believes was last year (documented in 2021 as above). Tells me that about a week ago he had some bright red blood in the toilet and then some on the toilet paper for a few days after.  Tells me he has continued to notice occasional smears of bright red blood since then.  Does tell me that when this started he passed a harder than normal stool.  Denies any overt rectal pain.  Tells me occasionally he will place Keflex in his rectum because it feels irritated,  as prescribed by his PCP.  Describes that really prior to being on all these medications for his heart he had completely normal bowel movements and no GI complaints.    Denies fever, chills, weight loss, nausea, vomiting or abdominal pain.  Past Medical History:  Diagnosis Date   Allergic state 04/25/2007   Seasonal allergies-well controlled with Allegra and Flonase     CAD (coronary  artery disease)    a. 12/2021: s/p STEMI with DES to proximal LAD   Dermatitis 09/22/2011   Headache(784.0)    Headache(784.0) 09/22/2011   HFrEF (heart failure with reduced ejection fraction) (HCC)    a. EF 30-35% in 12/2021 in the setting of anterior STEMI   Hyperlipidemia, mixed 08/11/2014   Preventative health care 04/02/2011   Sleep apnea    Sleep apnea     Past Surgical History:  Procedure Laterality Date   CARDIAC CATHETERIZATION     CORONARY/GRAFT ACUTE MI REVASCULARIZATION N/A 01/20/2022   Procedure: Coronary/Graft Acute MI Revascularization;  Surgeon: Swaziland, Peter M, MD;  Location: [facility] INVASIVE CV LAB;  Service: Cardiovascular;  Laterality: N/A;   LEFT HEART CATH AND CORONARY ANGIOGRAPHY N/A 01/20/2022   Procedure: LEFT HEART CATH AND CORONARY ANGIOGRAPHY;  Surgeon: Swaziland, Peter M, MD;  Location: [facility] INVASIVE CV LAB;  Service: Cardiovascular;  Laterality: N/A;    Current Outpatient Medications  Medication Sig Dispense Refill   aspirin EC 81 MG tablet Take 1 tablet (81 mg total) by mouth daily. Swallow whole. 30 tablet 12   dapagliflozin propanediol (FARXIGA) 10 MG TABS tablet Take 1 tablet (10 mg total) by mouth daily. 30 tablet 5   nitroGLYCERIN (NITROSTAT) 0.4 MG SL tablet Place 1 tablet (0.4 mg total) under the tongue every 5 (five) minutes x 3 doses as needed  for chest pain. 25 tablet 2   rosuvastatin (CRESTOR) 5 MG tablet Take 1 tablet (5 mg total) by mouth daily. 90 tablet 3   sacubitril-valsartan (ENTRESTO) 24-26 MG Take 1 tablet by mouth 2 (two) times daily. 60 tablet 11   spironolactone (ALDACTONE) 25 MG tablet Take 1/2 tablet ( 12.5 mg ) daily 45 tablet 3   ticagrelor (BRILINTA) 90 MG TABS tablet Take 1 tablet (90 mg total) by mouth 2 (two) times daily. 60 tablet 11   triamcinolone cream (KENALOG) 0.1 % Apply 1 Application topically 2 (two) times daily. 30 g 1   No current facility-administered medications for this visit.    Allergies as of 06/10/2022 - Review  Complete 05/27/2022  Allergen Reaction Noted   Lipitor [atorvastatin] Other (See Comments) 04/22/2022    Family History  Problem Relation Age of Onset   Heart disease Mother    Diabetes Mother        diet controlled DM   Other Father        bowel obstruction with resection, hyponatremia   Ulcers Father    Depression Brother    Cancer Paternal Grandmother        esophageal cancer   GER disease Paternal Grandmother     Social History   Socioeconomic History   Marital status: Married    Spouse name: Not on file   Number of children: 2   Years of education: 16   Highest education level: Master's degree (e.g., MA, MS, MEng, MEd, MSW, MBA)  Occupational History   Not on file  Tobacco Use   Smoking status: Never   Smokeless tobacco: Never  Vaping Use   Vaping Use: Not on file  Substance and Sexual Activity   Alcohol use: Yes    Comment: rare beer   Drug use: No   Sexual activity: Yes    Comment: lives with wife,  works in Engineer, technical sales, vegetarian, wears a seat baelt  Other Topics Concern   Not on file  Social History Narrative   Patient is from [place] but lives in [place], has been in the [place] since 1996, lives with wife and 2 children-ages 76 and 8, wife works from home, her office is in [place]. Remainder family lives in [place]. Patient works in Engineer, technical sales. Enjoys tennis. Vegetarian.   Social Determinants of Health   Financial Resource Strain: Not on file  Food Insecurity: Not on file  Transportation Needs: Not on file  Physical Activity: Not on file  Stress: Not on file  Social Connections: Not on file  Intimate Partner Violence: Not on file    Review of Systems:    Constitutional: No weight loss, fever or chills Skin: No rash  Cardiovascular: No chest pain Respiratory: No SOB Gastrointestinal: See HPI and otherwise negative Genitourinary: No dysuria  Neurological: No headache, dizziness or syncope Musculoskeletal: No new muscle or joint pain Hematologic: No  bruising Psychiatric: No history of depression or anxiety   Physical Exam:  Vital signs: BP 108/62   Temp (!) 82 F (27.8 C)   Ht 5\' 9"  (1.753 m)   Wt 154 lb 2 oz (69.9 kg)   BMI 22.76 kg/m   Constitutional:   Pleasant male appears to be in NAD, Well developed, Well nourished, alert and cooperative Head:  Normocephalic and atraumatic. Eyes:   PEERL, EOMI. No icterus. Conjunctiva pink. Ears:  Normal auditory acuity. Neck:  Supple Throat: Oral cavity and pharynx without inflammation, swelling or lesion.  Respiratory: Respirations even  and unlabored. Lungs clear to auscultation bilaterally.   No wheezes, crackles, or rhonchi.  Cardiovascular: Normal S1, S2. No MRG. Regular rate and rhythm. No peripheral edema, cyanosis or pallor.  Gastrointestinal:  Soft, nondistended, nontender. No rebound or guarding. Normal bowel sounds. No appreciable masses or hepatomegaly. Rectal: External: Excoriations and some dermatitis around the rectum, posterior fissure; Internal: tenderness to palpation posteriorly, no mass Msk:  Symmetrical without gross deformities. Without edema, no deformity or joint abnormality.  Neurologic:  Alert and  oriented x4;  grossly normal neurologically.  Skin:   Dry and intact without significant lesions or rashes. Psychiatric: Demonstrates good judgement and reason without abnormal affect or behaviors.  RELEVANT LABS AND IMAGING: CBC    Component Value Date/Time   WBC 7.3 05/28/2022 0838   WBC 5.8 04/12/2022 1012   RBC 5.14 05/28/2022 0838   RBC 4.93 04/12/2022 1012   HGB 14.8 05/28/2022 0838   HCT 44.4 05/28/2022 0838   PLT 260 05/28/2022 0838   MCV 86 05/28/2022 0838   MCH 28.8 05/28/2022 0838   MCH 29.0 04/12/2022 1012   MCHC 33.3 05/28/2022 0838   MCHC 32.6 04/12/2022 1012   RDW 12.6 05/28/2022 0838   LYMPHSABS 2.5 05/28/2022 0838   MONOABS 0.5 03/11/2022 1027   EOSABS 0.2 05/28/2022 0838   BASOSABS 0.1 05/28/2022 0838    CMP     Component Value  Date/Time   NA 140 05/28/2022 0838   K 5.1 05/28/2022 0838   CL 102 05/28/2022 0838   CO2 25 05/28/2022 0838   GLUCOSE 89 05/28/2022 0838   GLUCOSE 87 05/14/2022 0850   BUN 10 05/28/2022 0838   CREATININE 0.89 05/28/2022 0838   CREATININE 0.88 04/15/2020 1155   CALCIUM 9.5 05/28/2022 0838   PROT 7.0 05/14/2022 0850   PROT 7.1 04/14/2022 1520   ALBUMIN 4.5 05/14/2022 0850   ALBUMIN 4.8 04/14/2022 1520   AST 16 05/14/2022 0850   ALT 16 05/14/2022 0850   ALKPHOS 89 05/14/2022 0850   BILITOT 1.9 (H) 05/14/2022 0850   BILITOT 1.4 (H) 04/14/2022 1520   GFRNONAA >60 04/12/2022 1012    Assessment: 1.  Rectal bleeding: About a week to week and a half ago, at first in the toilet and then on the toilet paper for a few days and still seeing some smears, up-to-date with colon cancer screening with Cologuard negative in 2021; most likely anal fissure as seen at time of exam 2.  Anal fissure: Seen at time of exam and tender, likely the source of bleeding 3.  Recent MI status post DES in July: On Brilinta and Aspirin 4.  Cologuard negative in 2021  Plan: 1.  Prescribed Diltiazem ointment to be applied 3 times daily to fissure for the next 6 to 8 weeks 2.  Recommend sits baths for 15 to 20 minutes, 2-3 times a day 3.  Recommend the patient keep his stool soft solid.  He can use stool softeners if needed. 4.  Discussed with patient that he is technically up-to-date with his colon cancer screening with negative Cologuard in 2021.  He would be due for repeat screening of some sort next year. 5.  Also discussed that at this point with his decreased heart function after recent heart attack we would have to do a colonoscopy at the hospital and he is at increased risk given recent cardiac event.  I recommend that we re-discuss the colonoscopy next summer, hopefully by then his ejection fraction will have improved as well.  6.  Patient assigned to Dr. Bryan Lemma today.  He will follow in clinic next summer  or sooner if necessary.  Explained that if he has any increase in bleeding or other symptoms he should call and let us know.  Ellouise Newer, PA-C [facility] 06/10/2022, 3:10 PM  Cc: Mosie Lukes, MD

## 2022-06-11 ENCOUNTER — Encounter (HOSPITAL_COMMUNITY)
Admission: RE | Admit: 2022-06-11 | Discharge: 2022-06-11 | Disposition: A | Discharge status: Home / Self Care | Payer: No Typology Code available for payment source | Source: Ambulatory Visit | Attending: Cardiology | Admitting: Cardiology

## 2022-06-11 DIAGNOSIS — I2102 ST elevation (STEMI) myocardial infarction involving left anterior descending coronary artery: Secondary | ICD-10-CM

## 2022-06-11 DIAGNOSIS — Z5189 Encounter for other specified aftercare: Secondary | ICD-10-CM | POA: Diagnosis not present

## 2022-06-11 DIAGNOSIS — Z955 Presence of coronary angioplasty implant and graft: Secondary | ICD-10-CM

## 2022-06-11 NOTE — Progress Notes (Signed)
Discharge Progress Report  Patient Details  Name: Michael Gibbs MRN: [id number] Date of Birth: [birth year] Referring Provider:   Flowsheet Row INTENSIVE CARDIAC REHAB ORIENT from 03/25/2022 in [facility]  Referring Provider Dr. Peter Martinique MD        Number of Visits: 29  Reason for Discharge:  Patient reached a stable level of exercise. Patient independent in their exercise. Patient has met program and personal goals.  Smoking History:  Social History   Tobacco Use  Smoking Status Never  Smokeless Tobacco Never    Diagnosis:  01/20/22 STEMI  01/20/22 S/P DES Prox LAD  ADL UCSD:   Initial Exercise Prescription:  Initial Exercise Prescription - 03/25/22 1400       Date of Initial Exercise RX and Referring Provider   Date 03/25/22    Referring Provider Dr. Peter Martinique MD    Expected Discharge Date 05/28/22      Bike   Level 2.5    Minutes 15    METs 2.2      NuStep   Level 3    SPM 70    Minutes 15    METs 2.2      Prescription Details   Frequency (times per week) 3    Duration Progress to 30 minutes of continuous aerobic without signs/symptoms of physical distress      Intensity   THRR 40-80% of Max Heartrate 65-131    Ratings of Perceived Exertion 11-13    Perceived Dyspnea 0-4      Progression   Progression Continue progressive overload as per policy without signs/symptoms or physical distress.      Resistance Training   Training Prescription Yes    Weight 4    Reps 10-15             Discharge Exercise Prescription (Final Exercise Prescription Changes):  Exercise Prescription Changes - 06/11/22 1619       Response to Exercise   Blood Pressure (Admit) 96/70    Blood Pressure (Exercise) 124/62    Blood Pressure (Exit) 94/62    Heart Rate (Admit) 63 bpm    Heart Rate (Exercise) 118 bpm    Heart Rate (Exit) 72 bpm    Rating of Perceived Exertion (Exercise) 10     Perceived Dyspnea (Exercise) 0    Comments Pt fraduated teh CRP2 program    Duration Progress to 30 minutes of  aerobic without signs/symptoms of physical distress    Intensity THRR unchanged      Progression   Progression Continue to progress workloads to maintain intensity without signs/symptoms of physical distress.    Average METs 4.1      Resistance Training   Training Prescription Yes    Weight 5 lbs wts    Reps 10-15    Time 10 Minutes      Bike   Level 2.5    Watts 78    Minutes 15    METs 4.3      NuStep   Level 6    SPM 137    Minutes 15    METs 3.9      Home Exercise Plan   Plans to continue exercise at Home (comment)    Frequency Add 4 additional days to program exercise sessions.    Initial Home Exercises Provided 05/05/22             Functional Capacity:  6 Minute Walk  Michael Gibbs Name 03/25/22 1439 06/09/22 1622       6 Minute Walk   Phase Initial Discharge    Distance 1800 feet 1870 feet    Distance % Change -- 3.89 %    Distance Feet Change -- 70 ft    Walk Time 6 minutes 6 minutes    # of Rest Breaks 0 0    MPH 3.41 3.54    METS 4.66 4.94    RPE 9 11    Perceived Dyspnea  0 0    VO2 Peak 16.31 17.3    Symptoms No No    Resting HR 72 bpm 72 bpm    Resting BP 98/60 92/64    Resting Oxygen Saturation  100 % 72 %    Exercise Oxygen Saturation  during 6 min walk 100 % --    Max Ex. HR 107 bpm 121 bpm    Max Ex. BP 110/72 112/62    2 Minute Post BP 110/68 --             Psychological, QOL, Others - Outcomes: PHQ 2/9:    06/11/2022    3:20 PM 04/22/2022    8:28 AM 04/02/2022    4:52 PM 03/11/2022    9:42 AM 04/16/2021    9:15 AM  Depression screen PHQ 2/9  Decreased Interest 0 0 0 0 0  Down, Depressed, Hopeless 0 0 0 0 0  PHQ - 2 Score 0 0 0 0 0  Altered sleeping  0   0  Tired, decreased energy  0   0  Change in appetite  0   0  Feeling bad or failure about yourself   0   0  Trouble concentrating  0   0  Moving slowly or  fidgety/restless  0   0  Suicidal thoughts  0   0  PHQ-9 Score  0   0  Difficult doing work/chores  Not difficult at all       Quality of Life:  Quality of Life - 06/11/22 1633       Quality of Life   Select Quality of Life      Quality of Life Scores   Health/Function Post 22.21 %    Socioeconomic Post 22.31 %    Psych/Spiritual Post 22.5 %    Family Post 23.6 %    GLOBAL Post 22.52 %             Personal Goals: Goals established at orientation with interventions provided to work toward goal.  Personal Goals and Risk Factors at Admission - 03/25/22 1502       Core Components/Risk Factors/Patient Goals on Admission    Weight Management Weight Maintenance;Yes    Intervention Weight Management: Provide education and appropriate resources to help participant work on and attain dietary goals.;Weight Management: Develop a combined nutrition and exercise program designed to reach desired caloric intake, while maintaining appropriate intake of nutrient and fiber, sodium and fats, and appropriate energy expenditure required for the weight goal.    Expected Outcomes Weight Maintenance: Understanding of the daily nutrition guidelines, which includes 25-35% calories from fat, 7% or less cal from saturated fats, less than 290m cholesterol, less than 1.5gm of sodium, & 5 or more servings of fruits and vegetables daily;Understanding recommendations for meals to include 15-35% energy as protein, 25-35% energy from fat, 35-60% energy from carbohydrates, less than 2080mof dietary cholesterol, 20-35 gm of total fiber daily;Understanding of distribution of calorie intake  throughout the day with the consumption of 4-5 meals/snacks    Lipids Yes    Intervention Provide education and support for participant on nutrition & aerobic/resistive exercise along with prescribed medications to achieve LDL <44m, HDL >458m    Expected Outcomes Short Term: Participant states understanding of desired  cholesterol values and is compliant with medications prescribed. Participant is following exercise prescription and nutrition guidelines.;Long Term: Cholesterol controlled with medications as prescribed, with individualized exercise RX and with personalized nutrition plan. Value goals: LDL < 7021mHDL > 40 mg.    Stress Yes    Intervention Offer individual and/or small group education and counseling on adjustment to heart disease, stress management and health-related lifestyle change. Teach and support self-help strategies.;Refer participants experiencing significant psychosocial distress to appropriate mental health specialists for further evaluation and treatment. When possible, include family members and significant others in education/counseling sessions.    Expected Outcomes Short Term: Participant demonstrates changes in health-related behavior, relaxation and other stress management skills, ability to obtain effective social support, and compliance with psychotropic medications if prescribed.;Long Term: Emotional wellbeing is indicated by absence of clinically significant psychosocial distress or social isolation.    Personal Goal Other Yes    Personal Goal Short: get back on his feet Long: inc. strength    Intervention Will continue to monitor pt and progress workloads as tolerated without sign or symptom    Expected Outcomes Pt will achieve his goals and gain strength.              Personal Goals Discharge:  Goals and Risk Factor Review     Row Name 04/02/22 1656 05/05/22 1005 06/02/22 0941         Core Components/Risk Factors/Patient Goals Review   Personal Goals Review Weight Management/Obesity;Lipids;Stress Weight Management/Obesity;Lipids;Stress Weight Management/Obesity;Lipids;Stress     Review Michael Gibbs started cardiac rehab on 04/02/22 and did well with exercise, vital signs stable Michael Gibbs has been doing well with exercise. Systolic BP's has been in the 90's at times.  Asymptomatic. Michael Gibbs has lost 2.4 kg since starting cardiac rehab. Will continue to monitor BP Michael Gibbs continues to do well with exercise at intensive cardiac rehab. Michael Gibbs will complete intensvie cardiac rehab on 06/09/22     Expected Outcomes Michael Gibbs will continue to participate in intensive cardiac rehab for exercise, nutrition and lifestyle modifications Michael Gibbs will continue to participate in intensive cardiac rehab for exercise, nutrition and lifestyle modifications Michael Gibbs will continue to participate in intensive cardiac rehab for exercise, nutrition and lifestyle modifications              Exercise Goals and Review:  Exercise Goals     Row Name 03/25/22 1446             Exercise Goals   Increase Physical Activity Yes       Intervention Provide advice, education, support and counseling about physical activity/exercise needs.;Develop an individualized exercise prescription for aerobic and resistive training based on initial evaluation findings, risk stratification, comorbidities and participant's personal goals.       Expected Outcomes Short Term: Attend rehab on a regular basis to increase amount of physical activity.;Long Term: Add in home exercise to make exercise part of routine and to increase amount of physical activity.;Long Term: Exercising regularly at least 3-5 days a week.       Increase Strength and Stamina Yes       Intervention Provide advice, education, support and counseling about physical activity/exercise needs.;Develop an individualized exercise prescription for aerobic and  resistive training based on initial evaluation findings, risk stratification, comorbidities and participant's personal goals.       Expected Outcomes Short Term: Increase workloads from initial exercise prescription for resistance, speed, and METs.;Short Term: Perform resistance training exercises routinely during rehab and add in resistance training at home;Long Term: Improve cardiorespiratory  fitness, muscular endurance and strength as measured by increased METs and functional capacity (6MWT)       Able to understand and use rate of perceived exertion (RPE) scale Yes       Intervention Provide education and explanation on how to use RPE scale       Expected Outcomes Short Term: Able to use RPE daily in rehab to express subjective intensity level;Long Term:  Able to use RPE to guide intensity level when exercising independently       Knowledge and understanding of Target Heart Rate Range (THRR) Yes       Intervention Provide education and explanation of THRR including how the numbers were predicted and where they are located for reference       Expected Outcomes Short Term: Able to state/look up THRR;Long Term: Able to use THRR to govern intensity when exercising independently;Short Term: Able to use daily as guideline for intensity in rehab       Understanding of Exercise Prescription Yes       Intervention Provide education, explanation, and written materials on patient's individual exercise prescription       Expected Outcomes Short Term: Able to explain program exercise prescription;Long Term: Able to explain home exercise prescription to exercise independently                Exercise Goals Re-Evaluation:  Exercise Goals Re-Evaluation     Row Name 04/02/22 1628 05/05/22 1628 05/19/22 1615 06/11/22 1624       Exercise Goal Re-Evaluation   Exercise Goals Review Increase Physical Activity;Increase Strength and Stamina;Able to understand and use rate of perceived exertion (RPE) scale;Knowledge and understanding of Target Heart Rate Range (THRR);Understanding of Exercise Prescription Increase Physical Activity;Increase Strength and Stamina;Able to understand and use rate of perceived exertion (RPE) scale;Knowledge and understanding of Target Heart Rate Range (THRR);Understanding of Exercise Prescription Increase Physical Activity;Increase Strength and Stamina;Able to understand and  use rate of perceived exertion (RPE) scale;Knowledge and understanding of Target Heart Rate Range (THRR);Understanding of Exercise Prescription Increase Physical Activity;Increase Strength and Stamina;Able to understand and use rate of perceived exertion (RPE) scale;Knowledge and understanding of Target Heart Rate Range (THRR);Understanding of Exercise Prescription    Comments Pt first day in the CRP2 program. Pt tolerated Exercise well with an average MET level of 3.85. Pt is learning his THRR, RPE and ExRx. Reviewed MET's, goals and home ExRx. Pt tolerated Exercise well with an average MET level of 3.75. Pt is already meeting exercise guidlines and walks 2 miles in about 30 mins most days. He is also interested in yoga and plans to join classes soon. Pt was feeling really good about his goals prior to his ER visit, he has been feeling weaker, but is now back on track. Reviewed MET's with pt today. Pt tolerated Exercise well with an average MET level of 3.9. Pt is doing well with his ExRx and is progressing well in the program Pt graduated the [employer] program today. Pt tolerated Exercise well with an average MET level of 4.1. Pt will continue to exercise on his own by walking 5-6 days for 30-45 mins per session. Pt is planning on starting  a Yoga class now that he has graduated. Pt feels good about the progress he's made and has enjoyed his time in the program    Expected Outcomes Will continue to monitor pt and progress workloads as tolerated without sign or symptom Will continue to monitor pt and progress workloads as tolerated without sign or symptom. Pt will continue to exercise on his own and gain strength Will continue to monitor pt and progress workloads as tolerated without sign or symptom. Pt will continue to exercise on his own and gain strength Pt will continue to exericse on his own and gain strength             Nutrition & Weight - Outcomes:  Pre Biometrics - 03/25/22 1438       Pre  Biometrics   Waist Circumference 38.5 inches    Hip Circumference 39 inches    Waist to Hip Ratio 0.99 %    Triceps Skinfold 11 mm    % Body Fat 23.6 %    Grip Strength 12.5 kg    Flexibility 38 in    Single Leg Stand 30 seconds             Post Biometrics - 06/09/22 1628        Post  Biometrics   Height 5' 8.75" (1.746 m)    Weight 70 kg    Waist Circumference 34.75 inches    Hip Circumference 37.5 inches    Waist to Hip Ratio 0.93 %    BMI (Calculated) 22.96    Triceps Skinfold 11 mm    % Body Fat 21.7 %    Grip Strength 40 kg    Flexibility 12 in    Single Leg Stand 30 seconds             Nutrition:  Nutrition Therapy & Goals - 05/31/22 1631       Nutrition Therapy   Diet Heart Healthy Diet      Personal Nutrition Goals   Nutrition Goal Patient to identify strategies for managing cardiovascular risk by attending the weekly [employer] and nutrition courses    Personal Goal #2 Patient to limit sodium intake to <1556m per day    Personal Goal #3 Patient to identify and limit daily intake of saturated fat, trans fat, sodium, and refined carbohydrates    Comments Goals in action. Michael Gibbs continues to attend the [employer] nutrition series. He has implemented many dietary changes including mindfulness of saturated fat intake and continues vegetarian diet. His wife remains very supportive of lifestyle changes. He is down about 3.5# since starting with our program.      Intervention Plan   Intervention Prescribe, educate and counsel regarding individualized specific dietary modifications aiming towards targeted core components such as weight, hypertension, lipid management, diabetes, heart failure and other comorbidities.;Nutrition handout(s) given to patient.    Expected Outcomes Short Term Goal: Understand basic principles of dietary content, such as calories, fat, sodium, cholesterol and nutrients.;Long Term Goal: Adherence to prescribed nutrition plan.              Nutrition Discharge:  Nutrition Assessments - 06/15/22 0925       Rate Your Plate Scores   Post Score 75             Education Questionnaire Score:  Knowledge Questionnaire Score - 06/11/22 1630       Knowledge Questionnaire Score   Post Score 23/24  Goals reviewed with patient; copy given to patient.Pt graduates from  Intensive/Traditional cardiac rehab program on 06/11/22  with completion of  29  exercise and education sessions. Pt maintained good attendance and progressed nicely during their participation in rehab as evidenced by increased MET level.   Medication list reconciled. Repeat  PHQ score- 0 .  Pt has made significant lifestyle changes and should be commended for their success.  Michael Gibbs achieved their goals during cardiac rehab.   Pt plans to continue exercise at by walking 30-45 minutes a day and wants to incorporate Yoga into his fitness plan the first of the year. Michael Gibbs increased his distance on his post exercise walk test by 70 feet. We are proud of Michael Gibbs's progress! Harrell Gave RN BSN

## 2022-06-11 NOTE — Progress Notes (Signed)
Agree with the assessment and plan as outlined by Jennifer Lemmon, PA-C. ? ?Karlton Maya, DO, FACG ? ?

## 2022-06-25 ENCOUNTER — Telehealth: Payer: Self-pay | Admitting: Pharmacist

## 2022-06-25 ENCOUNTER — Encounter: Payer: Self-pay | Admitting: Pharmacist

## 2022-06-25 ENCOUNTER — Ambulatory Visit: Payer: No Typology Code available for payment source | Attending: Cardiovascular Disease | Admitting: Pharmacist

## 2022-06-25 DIAGNOSIS — I2510 Atherosclerotic heart disease of native coronary artery without angina pectoris: Secondary | ICD-10-CM

## 2022-06-25 DIAGNOSIS — I2102 ST elevation (STEMI) myocardial infarction involving left anterior descending coronary artery: Secondary | ICD-10-CM

## 2022-06-25 NOTE — Telephone Encounter (Signed)
PA for Repatha submitted.  Key: [id number]

## 2022-06-25 NOTE — Progress Notes (Signed)
[name]                 DOB: November 26, 1965                    MRN: [id number]     HPI: Michael Gibbs is a 56 y.o. male patient referred to lipid clinic by Dr [place]. PMH is significant for STEMI, CAD, CHF, HLD, and OSA. Patient is statin intolerant.  Patient presents today in good spirits. Was previously on atorvastatin 80mg  daily but discontinued due to myalgias. Pain mostly in his back. Switched to rosuvastatin 5mg  which he is tolerating but still has pain.  Went to ER in July 2023 for chest pain and diagnosed with STEMI. LAD 100% stenosed. LVEF 30-35%.  Eats a Vegetarian diet. Family lives in and he is unsure of their cardiac history. His Lpa however was elevated at 153.8  Current Medications:  Rosuvastatin 5mg  daily (does not want to take)  Intolerances:  Atorvastatin  Risk Factors:  CAD CHF Hx of STEMI  LDL goal: <55  Labs:  TC 99, Trigs 98, HDL 45, LDL 70 (05/28/22) Lpa 153.8 (01/21/22)  Past Medical History:  Diagnosis Date   Allergic state 04/25/2007   Seasonal allergies-well controlled with Allegra and Flonase     CAD (coronary artery disease)    a. 12/2021: s/p STEMI with DES to proximal LAD   Dermatitis 09/22/2011   Headache(784.0)    Headache(784.0) 09/22/2011   HFrEF (heart failure with reduced ejection fraction) (HCC)    a. EF 30-35% in 12/2021 in the setting of anterior STEMI   Hyperlipidemia, mixed 08/11/2014   Preventative health care 04/02/2011   Sleep apnea    Sleep apnea     Current Outpatient Medications on File Prior to Visit  Medication Sig Dispense Refill   aspirin EC 81 MG tablet Take 1 tablet (81 mg total) by mouth daily. Swallow whole. 30 tablet 12   dapagliflozin propanediol (FARXIGA) 10 MG TABS tablet Take 1 tablet (10 mg total) by mouth daily. 30 tablet 5   diltiazem 2 % GEL Apply 1 Application topically 3 (three) times daily. 6-8 weeks (Patient not taking: Reported on 06/11/2022) 30 g 0    nitroGLYCERIN (NITROSTAT) 0.4 MG SL tablet Place 1 tablet (0.4 mg total) under the tongue every 5 (five) minutes x 3 doses as needed for chest pain. 25 tablet 2   rosuvastatin (CRESTOR) 5 MG tablet Take 1 tablet (5 mg total) by mouth daily. 90 tablet 3   sacubitril-valsartan (ENTRESTO) 24-26 MG Take 1 tablet by mouth 2 (two) times daily. 60 tablet 11   spironolactone (ALDACTONE) 25 MG tablet Take 1/2 tablet ( 12.5 mg ) daily 45 tablet 3   ticagrelor (BRILINTA) 90 MG TABS tablet Take 1 tablet (90 mg total) by mouth 2 (two) times daily. 60 tablet 11   triamcinolone cream (KENALOG) 0.1 % Apply 1 Application topically 2 (two) times daily. 30 g 1   No current facility-administered medications on file prior to visit.    Allergies  Allergen Reactions   Lipitor [Atorvastatin] Other (See Comments)    Assessment/Plan:  1. Hyperlipidemia - Patient's LDL 70 which is above goal of <55. Likely genetic component to his CAD. Is motivated to reduce further CV risk but does not like the way statins have been affecting him.  Recommend PCSK9i.  Using demo pen, educated patient on mechanism of action, storage, site selection, administration, and possible adverse effects. Will complete  PA and contact patient when approved. Showed patient how to download copay card. Patient plans to discontinue rosuvastatin when he starts Repatha or Praluent. Will recheck lipid panel in 2-3 months.  Start Repatha/Praluent 140mg  q 2 weeks Recheck lipid panel in 2-3 months  , PharmD, BCACP, CDCES, CPP [address], [address] [place], [place], [place] Phone: [phone], Fax: [phone]

## 2022-06-25 NOTE — Patient Instructions (Addendum)
It was nice meeting you today  We would like your LDL (bad cholesterol) to be less than 55  We would like to start a new medication called Repatha or Praluent.  Both are injections you would take once every 2 weeks  I will complete the prior authorization for you and contact you when it is approved  You can discontinue your rosuvastatin once you start the new medication  We would like to recheck your lab work in 2-3 months after you start  Please call or message with any questions  Laural Golden, PharmD, BCACP, CDCES, CPP [address], [address] [place], [place], [address] Phone: [phone], Fax: [phone]

## 2022-06-29 NOTE — Telephone Encounter (Signed)
Clinical questions were faxed over. Filled out and faxed back

## 2022-07-01 ENCOUNTER — Encounter: Payer: Self-pay | Admitting: Pharmacist

## 2022-07-01 DIAGNOSIS — I2102 ST elevation (STEMI) myocardial infarction involving left anterior descending coronary artery: Secondary | ICD-10-CM

## 2022-07-01 DIAGNOSIS — E785 Hyperlipidemia, unspecified: Secondary | ICD-10-CM

## 2022-07-01 DIAGNOSIS — I2510 Atherosclerotic heart disease of native coronary artery without angina pectoris: Secondary | ICD-10-CM

## 2022-07-02 ENCOUNTER — Other Ambulatory Visit (HOSPITAL_COMMUNITY): Payer: Self-pay

## 2022-07-02 MED ORDER — REPATHA SURECLICK 140 MG/ML ~~LOC~~ SOAJ: 1.0000 mL | SUBCUTANEOUS | 11 refills | Status: DC

## 2022-07-02 NOTE — Addendum Note (Signed)
Addended by: Rollen Sox on: 07/02/2022 02:14 PM   Modules accepted: Orders

## 2022-07-13 ENCOUNTER — Encounter: Payer: Self-pay | Admitting: Cardiology

## 2022-07-15 NOTE — Telephone Encounter (Signed)
Spoke to patient Dr.Jordan's advice given. 

## 2022-07-24 ENCOUNTER — Other Ambulatory Visit: Payer: Self-pay | Admitting: Student

## 2022-07-29 ENCOUNTER — Encounter: Payer: Self-pay | Admitting: Family Medicine

## 2022-07-29 ENCOUNTER — Ambulatory Visit: Payer: No Typology Code available for payment source | Admitting: Family Medicine

## 2022-07-29 VITALS — BP 89/55 | HR 77 | Temp 98.0°F | Resp 16 | Ht 69.0 in | Wt 153.2 lb

## 2022-07-29 DIAGNOSIS — R17 Unspecified jaundice: Secondary | ICD-10-CM | POA: Diagnosis not present

## 2022-07-29 DIAGNOSIS — E875 Hyperkalemia: Secondary | ICD-10-CM

## 2022-07-29 DIAGNOSIS — R209 Unspecified disturbances of skin sensation: Secondary | ICD-10-CM | POA: Diagnosis not present

## 2022-07-29 LAB — COMPREHENSIVE METABOLIC PANEL
ALT: 12 U/L (ref 0–53)
AST: 13 U/L (ref 0–37)
Albumin: 4.6 g/dL (ref 3.5–5.2)
Alkaline Phosphatase: 85 U/L (ref 39–117)
BUN: 11 mg/dL (ref 6–23)
CO2: 32 mEq/L (ref 19–32)
Calcium: 9.4 mg/dL (ref 8.4–10.5)
Chloride: 102 mEq/L (ref 96–112)
Creatinine, Ser: 0.87 mg/dL (ref 0.40–1.50)
GFR: 95.72 mL/min (ref >60.00)
Glucose, Bld: 87 mg/dL (ref 70–99)
Potassium: 5.6 mEq/L — ABNORMAL HIGH (ref 3.5–5.1)
Sodium: 138 mEq/L (ref 135–145)
Total Bilirubin: 1.8 mg/dL — ABNORMAL HIGH (ref 0.2–1.2)
Total Protein: 7.1 g/dL (ref 6.0–8.3)

## 2022-07-29 LAB — CBC
HCT: 44.5 % (ref 39.0–52.0)
Hemoglobin: 15.2 g/dL (ref 13.0–17.0)
MCHC: 34.2 g/dL (ref 30.0–36.0)
MCV: 85.8 fl (ref 78.0–100.0)
Platelets: 267 10*3/uL (ref 150.0–400.0)
RBC: 5.19 Mil/uL (ref 4.22–5.81)
RDW: 13.5 % (ref 11.5–15.5)
WBC: 6.5 10*3/uL (ref 4.0–10.5)

## 2022-07-29 NOTE — Progress Notes (Signed)
Established Patient Office Visit  Subjective   [name], male    DOB: [birth year]  Age: 57 y.o. MRN: [id number]  Chief Complaint  Patient presents with   Follow-up    HPI  Patient reports that for the past few months he has noticed some coldness to his hands and feet. States it has been even more noticeable the past few weeks. Symptoms only occur when he is out for a walk, regardless of if the weather is very cold or not. Reports that he typically walks a 2-mile loop each day. He will notice that after walking for a few minutes, his hands and feet will get cold and the longer he walks, the worse it gets. Sometimes enough that he will notice pain and mild swelling to his feet. States he has never noticed any color changes to skin or numbness/tingling. Once he takes a break, symptoms resolve. He feels like severity of symptoms is directly related to time/distance exercising. States he has had baseline low blood pressures since recent MI, but is otherwise asymptomatic - Patient denies any chest pain, palpitations, dyspnea, wheezing, edema, recurrent headaches, vision changes.   Additionally he reports he has been passing more gas lately, but has not had any abdominal pain or diarrhea. He will have occasional constipation, so he has tried increasing his fiber intake via fruits/vegetables.        ROS All review of systems negative except what is listed in the HPI    Objective:     BP (!) 89/55   Pulse 77   Temp 98 F (36.7 C)   Resp 16   Ht 5\' 9"  (1.753 m)   Wt 153 lb 3.2 oz (69.5 kg)   SpO2 100%   BMI 22.62 kg/m    Physical Exam Vitals reviewed.  Constitutional:      Appearance: Normal appearance.  Cardiovascular:     Rate and Rhythm: Normal rate and regular rhythm.     Pulses: Normal pulses.     Heart sounds: Normal heart sounds.  Pulmonary:     Effort: Pulmonary effort is normal.     Breath sounds: Normal breath sounds.  Musculoskeletal:      Comments: Declined foot exam, states no symptoms currently  Skin:    General: Skin is warm and dry.     Capillary Refill: Capillary refill takes less than 2 seconds.  Neurological:     Mental Status: He is alert and oriented to person, place, and time.  Psychiatric:        Mood and Affect: Mood normal.        Behavior: Behavior normal.        Thought Content: Thought content normal.        Judgment: Judgment normal.      No results found for any visits on 07/29/22.    The ASCVD Risk score (Arnett DK, et al., 2019) failed to calculate for the following reasons:   The patient has a prior MI or stroke diagnosis    Assessment & Plan:   Problem List Items Addressed This Visit   None Visit Diagnoses     Bilateral cold feet    -  Primary No skin discoloration typically seen with Raynaud's. Patient is worried about circulation given recent MI. ABIs ordered. Recommend he ensure he is dressing in layers and keeping extremities warm when out in the cold. Blood pressure is soft, but this has been his recent baseline and he is otherwise  asymptomatic.    Relevant Orders   VAS [place] ABI WITH/WO TBI   CBC   Elevated bilirubin     Elevated a few months ago. No abdominal pain. Repeat labs today.    Relevant Orders   Comp Met (CMET)   CBC       Return in about 3 months (around 10/27/2022) for routine follow-up (PCP).    Terrilyn Saver, NP

## 2022-07-30 ENCOUNTER — Encounter: Payer: Self-pay | Admitting: Cardiology

## 2022-07-30 MED ORDER — LOKELMA 10 G PO PACK: 10.0000 g | PACK | Freq: Once | ORAL | 0 refills | Status: AC

## 2022-07-30 NOTE — Progress Notes (Signed)
Your potassium is up. Several of your cardiac meds can cause high potassium. I am going to give you one dose of Lokelma to try to get this back to normal range. We will need to recheck on Monday - please schedule a lab appointment.

## 2022-07-30 NOTE — Addendum Note (Signed)
Addended by: Terrilyn Saver on: 07/30/2022 09:48 AM   Modules accepted: Orders

## 2022-08-03 NOTE — Addendum Note (Signed)
Addended by: Kelle Darting A on: 08/03/2022 03:07 PM   Modules accepted: Orders

## 2022-08-05 ENCOUNTER — Other Ambulatory Visit (INDEPENDENT_AMBULATORY_CARE_PROVIDER_SITE_OTHER): Payer: No Typology Code available for payment source

## 2022-08-05 DIAGNOSIS — E875 Hyperkalemia: Secondary | ICD-10-CM | POA: Diagnosis not present

## 2022-08-05 LAB — POTASSIUM: Potassium: 4.9 mEq/L (ref 3.5–5.1)

## 2022-08-13 ENCOUNTER — Ambulatory Visit (HOSPITAL_COMMUNITY)
Admission: RE | Admit: 2022-08-13 | Discharge: 2022-08-13 | Disposition: A | Discharge status: Home / Self Care | Payer: No Typology Code available for payment source | Source: Ambulatory Visit | Attending: Family Medicine | Admitting: Family Medicine

## 2022-08-13 DIAGNOSIS — R209 Unspecified disturbances of skin sensation: Secondary | ICD-10-CM

## 2022-08-13 LAB — VAS US ABI WITH/WO TBI
Left ABI: 1.2
Right ABI: 1.3

## 2022-08-17 ENCOUNTER — Encounter: Payer: Self-pay | Admitting: Cardiology

## 2022-08-17 NOTE — Progress Notes (Signed)
Cardiology Office Note:    Date:  08/24/2022   ID:  [name], [name] 1966/06/14, MRN [name]  PCP:  Mosie Lukes, MD   [facility] Cardiologist:  Belkys Henault Martinique, MD     Referring MD: Mosie Lukes, MD   Chief Complaint  Patient presents with   Coronary Artery Disease   Congestive Heart Failure    History of Present Illness:    Michael Gibbs is a 57 y.o. male with a hx of CAD. He was admitted in July 2023 with STEMI.  LHC showed occluded proximal LAD, underwent successful PCI with DES to LAD.  Echocardiogram showed EF 30 to 35%.  He was started on DAPT with aspirin and Brilinta, as well as high intensity statin and beta-blocker and Farxiga.  He was not initially started on ACE/ARB/ARNI or MRA due to low BP.  Repeat echocardiogram 03/29/2022 showed EF remained 30 to 35%.  He  was eventually started on losartan 25 mg daily, Farxiga 10 mg daily, spironolactone 12.5 mg daily, aspirin and Brilinta.  He did develop significant myalgias on high dose lipitor. Also had some bilirubin elevation. These symptoms resolved off lipitor. He was seen by pharm D and started on Repatha. In Early Feb his labs showed elevated potassium level of 5.6. He was given a single dose of lokelma and aldactone was stopped. Repeat potassium was 4.9.  He did have a sleep study showing very mild sleep apnea. Conservative therapy recommended.   On follow up today he is doing well. He does note there are times when his hands turn real cold. They do not change color. He also notes more GI gas although there has been no change in diet. No dyspnea or chest pain. Reports entresto is costing him $600/month.     Past Medical History:  Diagnosis Date   Allergic state 04/25/2007   Seasonal allergies-well controlled with Allegra and Flonase     CAD (coronary artery disease)    a. 12/2021: s/p STEMI with DES to proximal LAD   Dermatitis 09/22/2011   Headache(784.0)     Headache(784.0) 09/22/2011   HFrEF (heart failure with reduced ejection fraction) ([facility])    a. EF 30-35% in 12/2021 in the setting of anterior STEMI   Hyperlipidemia, mixed 08/11/2014   Preventative health care 04/02/2011   Sleep apnea    Sleep apnea     Past Surgical History:  Procedure Laterality Date   CARDIAC CATHETERIZATION     CORONARY/GRAFT ACUTE MI REVASCULARIZATION N/A 01/20/2022   Procedure: Coronary/Graft Acute MI Revascularization;  Surgeon: Martinique, Edward Guthmiller M, MD;  Location: [facility];  Service: Cardiovascular;  Laterality: N/A;   LEFT HEART CATH AND CORONARY ANGIOGRAPHY N/A 01/20/2022   Procedure: LEFT HEART CATH AND CORONARY ANGIOGRAPHY;  Surgeon: Martinique, Eddith Mentor M, MD;  Location: [facility];  Service: Cardiovascular;  Laterality: N/A;    Current Medications: Current Meds  Medication Sig   aspirin EC 81 MG tablet Take 1 tablet (81 mg total) by mouth daily. Swallow whole.   dapagliflozin propanediol (FARXIGA) 10 MG TABS tablet Take 1 tablet by mouth once daily   diltiazem 2 % GEL Apply 1 Application topically 3 (three) times daily. 6-8 weeks   Evolocumab (REPATHA SURECLICK) [id number] MG/ML SOAJ Inject 140 mg into the skin every 14 (fourteen) days.   nitroGLYCERIN (NITROSTAT) 0.4 MG SL tablet Place 1 tablet (0.4 mg total) under the tongue every 5 (five) minutes x 3 doses as needed for  chest pain.   sacubitril-valsartan (ENTRESTO) 24-26 MG Take 1 tablet by mouth 2 (two) times daily.   ticagrelor (BRILINTA) 90 MG TABS tablet Take 1 tablet (90 mg total) by mouth 2 (two) times daily.   triamcinolone cream (KENALOG) 0.1 % Apply 1 Application topically 2 (two) times daily.     Allergies:   Lipitor [atorvastatin]   Social History   Socioeconomic History   Marital status: Married    Spouse name: Not on file   Number of children: 2   Years of education: 16   Highest education level: Master's degree (e.g., MA, MS, MEng, MEd, MSW, MBA)  Occupational History   Not on  file  Tobacco Use   Smoking status: Never   Smokeless tobacco: Never  Vaping Use   Vaping Use: Not on file  Substance and Sexual Activity   Alcohol use: Yes    Comment: rare beer   Drug use: No   Sexual activity: Yes    Comment: lives with wife,  works in Engineer, technical sales, vegetarian, wears a seat baelt  Other Topics Concern   Not on file  Social History Narrative   Patient is from [place] but lives in [place], has been in the [place] since 1996, lives with wife and 2 children-ages 70 and 81, wife works from home, her office is in [place]. Remainder family lives in [place]. Patient works in Engineer, technical sales. Enjoys tennis. Vegetarian.   Social Determinants of Health   Financial Resource Strain: Not on file  Food Insecurity: Not on file  Transportation Needs: Not on file  Physical Activity: Not on file  Stress: Not on file  Social Connections: Not on file     Family History: The patient's family history includes Cancer in his paternal grandmother; Depression in his brother; Diabetes in his mother; GER disease in his paternal grandmother; Heart disease in his mother; Other in his father; Ulcers in his father.  ROS:   Please see the history of present illness.     All other systems reviewed and are negative.  EKGs/Labs/Other Studies Reviewed:    The following studies were reviewed today: Cardiac cath 01/20/22:  Coronary/Graft Acute MI Revascularization  LEFT HEART CATH AND CORONARY ANGIOGRAPHY   Conclusion      Prox LAD lesion is 100% stenosed.   Mid Cx to Dist Cx lesion is 20% stenosed with 20% stenosed side branch in LPAV.   A drug-eluting stent was successfully placed using a SYNERGY XD [id number].   Post intervention, there is a 0% residual stenosis.   LV end diastolic pressure is moderately elevated.   Single vessel occlusive CAD involving the proximal LAD Moderately elevated LVEDP 30 mm Hg Successful PCI of the proximal LAD with DES x 1.   Plan: DAPT for one year.    Diagnostic Dominance: Left  Intervention   Echo 03/29/22: IMPRESSIONS     1. Septal/apical akinesis inferior apical akinesis Mid/baseal inferior  wall hypokinesis . Left ventricular ejection fraction, by estimation, is  30 to 35%. The left ventricle has moderately decreased function. The left  ventricle demonstrates regional  wall motion abnormalities (see scoring diagram/findings for description).  The left ventricular internal cavity size was moderately dilated. There is  mild asymmetric left ventricular hypertrophy of the basal and septal  segments. Left ventricular diastolic   parameters were normal.   2. Right ventricular systolic function is normal. The right ventricular  size is normal.   3. The mitral valve is abnormal. Mild mitral valve regurgitation.  No  evidence of mitral stenosis.   4. The aortic valve is tricuspid. There is mild calcification of the  aortic valve. Aortic valve regurgitation is not visualized. Aortic valve  sclerosis is present, with no evidence of aortic valve stenosis.   5. The inferior vena cava is normal in size with greater than 50%  respiratory variability, suggesting right atrial pressure of 3 mmHg.    EKG:  EKG is not ordered today.    Recent Labs: 01/21/2022: TSH 1.993 01/22/2022: B Natriuretic Peptide 225.6; Magnesium 2.0 07/29/2022: ALT 12; BUN 11; Creatinine, Ser 0.87; Hemoglobin 15.2; Platelets 267.0; Sodium 138 08/05/2022: Potassium 4.9  Recent Lipid Panel    Component Value Date/Time   CHOL 133 05/28/2022 0838   TRIG 98 05/28/2022 0838   HDL 45 05/28/2022 0838   CHOLHDL 3.0 05/28/2022 0838   CHOLHDL 4.7 01/20/2022 2245   VLDL 27 01/20/2022 2245   LDLCALC 70 05/28/2022 0838   LDLCALC 132 (H) 04/15/2020 1155   LDLDIRECT 110 (H) 04/25/2007 1845     Risk Assessment/Calculations:      Physical Exam:    VS:  BP 116/64   Pulse 66   Ht '5\' 9"'$  (1.753 m)   Wt 158 lb (71.7 kg)   SpO2 100%   BMI 23.33 kg/m     Wt Readings from  Last 3 Encounters:  08/24/22 158 lb (71.7 kg)  07/29/22 153 lb 3.2 oz (69.5 kg)  06/10/22 154 lb 2 oz (69.9 kg)     GEN:  Well nourished, well developed in no acute distress HEENT: Normal NECK: No JVD; No carotid bruits LYMPHATICS: No lymphadenopathy CARDIAC: RRR, no murmurs, rubs, gallops RESPIRATORY:  Clear to auscultation without rales, wheezing or rhonchi  ABDOMEN: Soft, non-tender, non-distended MUSCULOSKELETAL:  No edema; No deformity  SKIN: Warm and dry NEUROLOGIC:  Alert and oriented x 3 PSYCHIATRIC:  Normal affect   ASSESSMENT:    1. STEMI involving left anterior descending coronary artery (HCC) - s/p stent to proximal LAD   2. Coronary artery disease involving native coronary artery of native heart, unspecified whether angina present   3. Hyperlipidemia, unspecified hyperlipidemia type   4. Chronic systolic CHF (congestive heart failure) (HCC)      PLAN:    In order of problems listed above:  CAD s/p acute anterior STEMI in July with emergent stenting of the LAD. Troponin > 24,000. Ecg with persistent Q waves. EF 30-35%. On DAPT for one year. Not on beta blocker due to low resting HR. He is asymptomatic. Has completed Cardiac Rehab.  Chronic systolic CHF secondary to ischemic CM. Now on Entresto,  Farxiga. Had to stop aldactone due to hyperkalemia. Not on beta blocker due to slow HR. Given savings card for [facility]. Plan cardiac MRI next week.  If EF is still < 35% will need to consider for ICD Hypercholesterolemia. Did not tolerate high dose statin. Now  on Crestor 5 mg daily. On Repatha now. Will check labs next month  Follow up in 6 months      Signed, Mechele Kittleson Martinique, MD  08/24/2022 4:28 PM    [place]

## 2022-08-24 ENCOUNTER — Ambulatory Visit: Payer: No Typology Code available for payment source | Attending: Cardiology | Admitting: Cardiology

## 2022-08-24 ENCOUNTER — Encounter: Payer: Self-pay | Admitting: Cardiology

## 2022-08-24 VITALS — BP 116/64 | HR 66 | Ht 69.0 in | Wt 158.0 lb

## 2022-08-24 DIAGNOSIS — I5022 Chronic systolic (congestive) heart failure: Secondary | ICD-10-CM | POA: Diagnosis not present

## 2022-08-24 DIAGNOSIS — I2510 Atherosclerotic heart disease of native coronary artery without angina pectoris: Secondary | ICD-10-CM

## 2022-08-24 DIAGNOSIS — I2102 ST elevation (STEMI) myocardial infarction involving left anterior descending coronary artery: Secondary | ICD-10-CM

## 2022-08-24 DIAGNOSIS — E785 Hyperlipidemia, unspecified: Secondary | ICD-10-CM | POA: Diagnosis not present

## 2022-08-24 NOTE — Patient Instructions (Signed)
Medication Instructions:  No changes *If you need a refill on your cardiac medications before your next appointment, please call your pharmacy*  Lab Work: Lipid and Liver panel- fasting- do not eat or drink after midnight. If you have labs (blood work) drawn today and your tests are completely normal, you will receive your results only by: [place] (if you have MyChart) OR A paper copy in the mail If you have any lab test that is abnormal or we need to change your treatment, we will call you to review the results.  Follow-Up: At [facility], you and your health needs are our priority.  As part of our continuing mission to provide you with exceptional heart care, we have created designated Provider Care Teams.  These Care Teams include your primary Cardiologist (physician) and Advanced Practice Providers (APPs -  Physician Assistants and Nurse Practitioners) who all work together to provide you with the care you need, when you need it.  We recommend signing up for the patient portal called "MyChart".  Sign up information is provided on this After Visit Summary.  MyChart is used to connect with patients for Virtual Visits (Telemedicine).  Patients are able to view lab/test results, encounter notes, upcoming appointments, etc.  Non-urgent messages can be sent to your provider as well.   To learn more about what you can do with MyChart, go to [email or web address].    Your next appointment:   6 month(s)  Provider:   Peter Martinique, MD

## 2022-08-26 ENCOUNTER — Telehealth (HOSPITAL_COMMUNITY): Payer: Self-pay | Admitting: Emergency Medicine

## 2022-08-26 NOTE — Telephone Encounter (Signed)
Attempted to call patient regarding upcoming cardiac MR appointment. Left message on voicemail with name and callback number Saintclair Schroader RN Navigator Cardiac Imaging [facility] [phone] Office [phone] Cell  

## 2022-08-27 ENCOUNTER — Ambulatory Visit (HOSPITAL_COMMUNITY)
Admission: RE | Admit: 2022-08-27 | Discharge: 2022-08-27 | Disposition: A | Discharge status: Home / Self Care | Payer: No Typology Code available for payment source | Source: Ambulatory Visit | Attending: Cardiology | Admitting: Cardiology

## 2022-08-27 ENCOUNTER — Other Ambulatory Visit: Payer: Self-pay | Admitting: Cardiology

## 2022-08-27 DIAGNOSIS — E785 Hyperlipidemia, unspecified: Secondary | ICD-10-CM

## 2022-08-27 DIAGNOSIS — I255 Ischemic cardiomyopathy: Secondary | ICD-10-CM

## 2022-08-27 DIAGNOSIS — I5022 Chronic systolic (congestive) heart failure: Secondary | ICD-10-CM

## 2022-08-27 DIAGNOSIS — I25118 Atherosclerotic heart disease of native coronary artery with other forms of angina pectoris: Secondary | ICD-10-CM | POA: Diagnosis present

## 2022-08-27 MED ORDER — GADOBUTROL 1 MMOL/ML IV SOLN
10.0000 mL | Freq: Once | INTRAVENOUS | Status: AC | PRN
  Administered 2022-08-27: 10 mL via INTRAVENOUS

## 2022-10-09 LAB — HEPATIC FUNCTION PANEL
ALT: 11 IU/L (ref 0–44)
AST: 13 IU/L (ref 0–40)
Albumin: 4.5 g/dL (ref 3.8–4.9)
Alkaline Phosphatase: 95 IU/L (ref 44–121)
Bilirubin Total: 2.1 mg/dL — ABNORMAL HIGH (ref 0.0–1.2)
Bilirubin, Direct: 0.14 mg/dL (ref 0.00–0.40)
Total Protein: 6.9 g/dL (ref 6.0–8.5)

## 2022-10-09 LAB — LIPID PANEL
Chol/HDL Ratio: 1.8 ratio (ref 0.0–5.0)
Cholesterol, Total: 90 mg/dL — ABNORMAL LOW (ref 100–199)
HDL: 49 mg/dL (ref >39)
LDL Chol Calc (NIH): 24 mg/dL (ref 0–99)
Triglycerides: 81 mg/dL (ref 0–149)
VLDL Cholesterol Cal: 17 mg/dL (ref 5–40)

## 2022-10-25 ENCOUNTER — Encounter: Payer: Self-pay | Admitting: Cardiology

## 2022-10-27 ENCOUNTER — Encounter: Payer: Self-pay | Admitting: Family Medicine

## 2022-10-27 ENCOUNTER — Encounter: Payer: Self-pay | Admitting: Cardiology

## 2022-10-27 DIAGNOSIS — E785 Hyperlipidemia, unspecified: Secondary | ICD-10-CM

## 2022-10-27 DIAGNOSIS — Z1321 Encounter for screening for nutritional disorder: Secondary | ICD-10-CM

## 2022-11-01 ENCOUNTER — Other Ambulatory Visit: Payer: Self-pay | Admitting: Family Medicine

## 2022-11-01 DIAGNOSIS — R17 Unspecified jaundice: Secondary | ICD-10-CM

## 2022-11-01 DIAGNOSIS — G4733 Obstructive sleep apnea (adult) (pediatric): Secondary | ICD-10-CM

## 2022-11-03 ENCOUNTER — Telehealth: Payer: Self-pay

## 2022-11-03 NOTE — Telephone Encounter (Signed)
Patient saw [facility] in 06/10/2022 and now we received a referral for elevation of bilirubin. Sent message back to the referring provider saying that the patient is a establish patient with [facility] and needs to see them. She states the patient is requesting to see you. Please advise if you will accept the transfer?

## 2022-11-04 NOTE — Telephone Encounter (Signed)
Called and left a message for call back  

## 2022-11-10 ENCOUNTER — Encounter: Payer: Self-pay | Admitting: Family Medicine

## 2022-11-10 ENCOUNTER — Other Ambulatory Visit: Payer: Self-pay | Admitting: Family Medicine

## 2022-11-10 DIAGNOSIS — R739 Hyperglycemia, unspecified: Secondary | ICD-10-CM

## 2022-11-11 ENCOUNTER — Other Ambulatory Visit: Payer: Self-pay

## 2022-11-11 DIAGNOSIS — Z1321 Encounter for screening for nutritional disorder: Secondary | ICD-10-CM

## 2022-11-11 DIAGNOSIS — E785 Hyperlipidemia, unspecified: Secondary | ICD-10-CM

## 2022-11-11 NOTE — Telephone Encounter (Signed)
Advised not able to run a HOMA test  Pt stated understand

## 2022-11-11 NOTE — Telephone Encounter (Signed)
Called pt was advised we can check A1C lab appt Made, orders placed.

## 2022-11-12 LAB — NMR, LIPOPROFILE
Cholesterol, Total: 103 mg/dL (ref 100–199)
HDL Particle Number: 34.3 umol/L (ref >=30.5)
HDL-C: 49 mg/dL (ref >39)
LDL Particle Number: 461 nmol/L (ref <1000)
LDL Size: 19.7 nm — ABNORMAL LOW (ref >20.5)
LDL-C (NIH Calc): 39 mg/dL (ref 0–99)
LP-IR Score: 45 (ref <=45)
Small LDL Particle Number: 293 nmol/L (ref <=527)
Triglycerides: 69 mg/dL (ref 0–149)

## 2022-11-17 ENCOUNTER — Other Ambulatory Visit (INDEPENDENT_AMBULATORY_CARE_PROVIDER_SITE_OTHER): Payer: No Typology Code available for payment source

## 2022-11-17 DIAGNOSIS — R739 Hyperglycemia, unspecified: Secondary | ICD-10-CM

## 2022-11-17 LAB — HEMOGLOBIN A1C: Hgb A1c MFr Bld: 5.3 % (ref 4.6–6.5)

## 2022-11-20 LAB — VITAMIN D 1,25 DIHYDROXY
Vitamin D 1, 25 (OH)2 Total: 33 pg/mL
Vitamin D2 1, 25 (OH)2: <10 pg/mL
Vitamin D3 1, 25 (OH)2: 33 pg/mL

## 2022-11-23 ENCOUNTER — Encounter: Payer: Self-pay | Admitting: Cardiology

## 2022-11-29 ENCOUNTER — Ambulatory Visit: Payer: No Typology Code available for payment source | Admitting: Gastroenterology

## 2022-11-29 ENCOUNTER — Ambulatory Visit (INDEPENDENT_AMBULATORY_CARE_PROVIDER_SITE_OTHER): Payer: No Typology Code available for payment source | Admitting: Gastroenterology

## 2022-11-29 NOTE — Progress Notes (Signed)
Wyline Mood MD, MRCP(U.K) [address]  [address]  [place], [place] [address]  Main: [phone]  Fax: [phone]   Gastroenterology Consultation  Referring Provider:     Bradd Canary, MD Primary Care Physician:  Bradd Canary, MD Primary Gastroenterologist:  Dr. Wyline Mood  Reason for Consultation:     elevated bilirubin        HPI:   ELVER GIACONA is a 57 y.o. y/o male referred for consultation & management  by Dr. Abner Greenspan, Bryon Lions, MD.     H/o CAD, seen by [facility] back in 05/2022 for rectal bleeding . Colonoscopy was not scheduled due to a recent heart attack.    He has been referred to me for elevated bilirubin , its been a predominant indirect hyperbilirubinemia.  He had been on a statin which has been stopped denies any over-the-counter medications denies any herbal supplements denies any excess alcohol consumption.  He is a vegetarian.  Lost some weight recently after the heart attack.  He is planning to schedule his colonoscopy soon.  Denies any rectal bleeding.  He is having some issues with glucose in the urine which is getting evaluated      Latest Ref Rng & Units 10/08/2022    9:23 AM 07/29/2022    9:58 AM 05/14/2022    8:50 AM  Hepatic Function  Total Protein 6.0 - 8.5 g/dL 6.9  7.1  7.0   Albumin 3.8 - 4.9 g/dL 4.5  4.6  4.5   AST 0 - 40 IU/L 13  13  16    ALT 0 - 44 IU/L 11  12  16    Alk Phosphatase 44 - 121 IU/L 95  85  89   Total Bilirubin 0.0 - 1.2 mg/dL 2.1  1.8  1.9   Bilirubin, Direct 0.00 - 0.40 mg/dL 9.52      01/4131: RUQ USG is negative.   Past Medical History:  Diagnosis Date   Allergic state 04/25/2007   Seasonal allergies-well controlled with Allegra and Flonase     CAD (coronary artery disease)    a. 12/2021: s/p STEMI with DES to proximal LAD   Dermatitis 09/22/2011   Headache(784.0)    Headache(784.0) 09/22/2011   HFrEF (heart failure with reduced ejection fraction) (HCC)    a. EF 30-35% in 12/2021 in the setting  of anterior STEMI   Hyperlipidemia, mixed 08/11/2014   Preventative health care 04/02/2011   Sleep apnea    Sleep apnea     Past Surgical History:  Procedure Laterality Date   CARDIAC CATHETERIZATION     CORONARY/GRAFT ACUTE MI REVASCULARIZATION N/A 01/20/2022   Procedure: Coronary/Graft Acute MI Revascularization;  Surgeon: Swaziland, Peter M, MD;  Location: [facility];  Service: Cardiovascular;  Laterality: N/A;   LEFT HEART CATH AND CORONARY ANGIOGRAPHY N/A 01/20/2022   Procedure: LEFT HEART CATH AND CORONARY ANGIOGRAPHY;  Surgeon: Swaziland, Peter M, MD;  Location: [facility];  Service: Cardiovascular;  Laterality: N/A;    Prior to Admission medications   Medication Sig Start Date End Date Taking? Authorizing Provider  aspirin EC 81 MG tablet Take 1 tablet (81 mg total) by mouth daily. Swallow whole. 01/24/22   Strader, Lennart Pall, PA-C  dapagliflozin propanediol (FARXIGA) 10 MG TABS tablet Take 1 tablet by mouth once daily 07/26/22   [place], Peter M, MD  diltiazem 2 % GEL Apply 1 Application topically 3 (three) times daily. 6-8 weeks 06/10/22   Hyacinth Meeker  Orlie Pollen, PA  Evolocumab (REPATHA SURECLICK) 140 MG/ML SOAJ Inject 140 mg into the skin every 14 (fourteen) days. 07/02/22   [place], Peter M, MD  nitroGLYCERIN (NITROSTAT) 0.4 MG SL tablet Place 1 tablet (0.4 mg total) under the tongue every 5 (five) minutes x 3 doses as needed for chest pain. 01/23/22   Strader, Lennart Pall, PA-C  sacubitril-valsartan (ENTRESTO) 24-26 MG Take 1 tablet by mouth 2 (two) times daily. 04/21/22   [place], Peter M, MD  ticagrelor (BRILINTA) 90 MG TABS tablet Take 1 tablet (90 mg total) by mouth 2 (two) times daily. 01/23/22   Strader, Lennart Pall, PA-C  triamcinolone cream (KENALOG) 0.1 % Apply 1 Application topically 2 (two) times daily. 04/22/22   Bradd Canary, MD    Family History  Problem Relation Age of Onset   Heart disease Mother    Diabetes Mother        diet controlled DM   Other  Father        bowel obstruction with resection, hyponatremia   Ulcers Father    Depression Brother    Cancer Paternal Grandmother        esophageal cancer   GER disease Paternal Grandmother      Social History   Tobacco Use   Smoking status: Never   Smokeless tobacco: Never  Substance Use Topics   Alcohol use: Yes    Comment: rare beer   Drug use: No    Allergies as of 11/29/2022 - Review Complete 11/29/2022  Allergen Reaction Noted   Lipitor [atorvastatin] Other (See Comments) 04/22/2022    Review of Systems:    All systems reviewed and negative except where noted in HPI.   Physical Exam:  BP 106/71   Pulse 64   Temp 98.1 F (36.7 C) (Oral)   Ht 5\' 9"  (1.753 m)   Wt 152 lb (68.9 kg)   BMI 22.45 kg/m  No LMP for male patient. Psych:  Alert and cooperative. Normal mood and affect. General:   Alert,  Well-developed, well-nourished, pleasant and cooperative in NAD Head:  Normocephalic and atraumatic. Eyes:  Sclera clear, no icterus.   Conjunctiva pink. Ears:  Normal auditory acuity.  Neurologic:  Alert and oriented x3;  grossly normal neurologically. Psych:  Alert and cooperative. Normal mood and affect.  Imaging Studies: No results found.  Assessment and Plan:   BRENTAN MUTCH is a 57 y.o. y/o male has been referred for indirect hyperbilirubinemia. This is consistent with Gilberts syndrome and is benign and requires no further intervention.  He has some nonspecific bloating has a diet which is rich in fruit and vegetable suggest a low FODMAP diet.  Provided him information on [name] syndrome.  Strongly suggested to proceed with colonoscopy.  Follow up PRN  Dr Wyline Mood MD,MRCP(U.K)

## 2022-11-30 ENCOUNTER — Encounter: Payer: Self-pay | Admitting: Family Medicine

## 2022-12-01 ENCOUNTER — Other Ambulatory Visit: Payer: Self-pay

## 2022-12-01 DIAGNOSIS — R350 Frequency of micturition: Secondary | ICD-10-CM

## 2022-12-06 ENCOUNTER — Telehealth: Payer: Self-pay | Admitting: Family Medicine

## 2022-12-06 ENCOUNTER — Other Ambulatory Visit (INDEPENDENT_AMBULATORY_CARE_PROVIDER_SITE_OTHER): Payer: No Typology Code available for payment source

## 2022-12-06 ENCOUNTER — Other Ambulatory Visit: Payer: Self-pay | Admitting: Family Medicine

## 2022-12-06 ENCOUNTER — Encounter: Payer: Self-pay | Admitting: Cardiology

## 2022-12-06 DIAGNOSIS — R350 Frequency of micturition: Secondary | ICD-10-CM | POA: Diagnosis not present

## 2022-12-06 DIAGNOSIS — R81 Glycosuria: Secondary | ICD-10-CM

## 2022-12-06 LAB — POC URINALSYSI DIPSTICK (AUTOMATED)
Bilirubin, UA: NEGATIVE
Blood, UA: NEGATIVE
Glucose, UA: POSITIVE — AB
Ketones, UA: NEGATIVE
Leukocytes, UA: NEGATIVE
Nitrite, UA: NEGATIVE
Protein, UA: NEGATIVE
Spec Grav, UA: <=1.005 — AB (ref 1.010–1.025)
Urobilinogen, UA: 0.2 E.U./dL
pH, UA: 6 (ref 5.0–8.0)

## 2022-12-06 NOTE — Addendum Note (Signed)
Addended by: Rosita Kea on: 12/06/2022 08:45 AM   Modules accepted: Orders

## 2022-12-06 NOTE — Telephone Encounter (Signed)
Called pt and message sent to provider and pt will call back to make lab appt.

## 2022-12-06 NOTE — Telephone Encounter (Signed)
Michael Gibbs (spouse DPR Ok) called stating that pt would like to look into additional blood work to look into symptoms further.

## 2022-12-13 ENCOUNTER — Other Ambulatory Visit: Payer: Self-pay

## 2022-12-13 ENCOUNTER — Ambulatory Visit: Payer: No Typology Code available for payment source | Admitting: Cardiology

## 2022-12-13 ENCOUNTER — Encounter: Payer: Self-pay | Admitting: Cardiology

## 2022-12-13 ENCOUNTER — Other Ambulatory Visit: Payer: No Typology Code available for payment source

## 2022-12-13 VITALS — BP 91/59 | HR 64 | Ht 69.0 in | Wt 151.0 lb

## 2022-12-13 DIAGNOSIS — R81 Glycosuria: Secondary | ICD-10-CM

## 2022-12-13 DIAGNOSIS — I255 Ischemic cardiomyopathy: Secondary | ICD-10-CM

## 2022-12-13 DIAGNOSIS — I25118 Atherosclerotic heart disease of native coronary artery with other forms of angina pectoris: Secondary | ICD-10-CM

## 2022-12-13 DIAGNOSIS — I5022 Chronic systolic (congestive) heart failure: Secondary | ICD-10-CM

## 2022-12-13 DIAGNOSIS — E7841 Elevated Lipoprotein(a): Secondary | ICD-10-CM

## 2022-12-13 DIAGNOSIS — E7800 Pure hypercholesterolemia, unspecified: Secondary | ICD-10-CM

## 2022-12-13 MED ORDER — ASPIRIN 81 MG PO CHEW: 81.0000 mg | CHEWABLE_TABLET | Freq: Every day | ORAL | Status: AC

## 2022-12-13 NOTE — Progress Notes (Signed)
Primary Physician/Referring:  Bradd Canary, MD  [name], male    DOB: 07/19/1965, 57 y.o.   MRN: [id number]  Chief Complaint  Patient presents with   Coronary Artery Disease   New Patient (Initial Visit)   HPI:    Michael Gibbs  is a 57 y.o. Asian male patient with stomach in July 2023 SB stenting to proximal LAD, hypercholesterolemia, statin intolerance presently on Repatha, mild OSA recommended medical therapy only presents to establish care, previously seen by Peter Swaziland, MD.  Patient has occasional episodes of dizziness when he suddenly stands up and has had 1 episode of near syncope when he suddenly stood up and felt markedly dizzy.  He has occasional palpitations, no chest pain and he has not used any sublingual nitroglycerin.  Past Medical History:  Diagnosis Date   Allergic state 04/25/2007   Seasonal allergies-well controlled with Allegra and Flonase     CAD (coronary artery disease)    a. 12/2021: s/p STEMI with DES to proximal LAD   Dermatitis 09/22/2011   Headache(784.0) 09/22/2011   HFrEF (heart failure with reduced ejection fraction) (HCC)    a. EF 30-35% in 12/2021 in the setting of anterior STEMI   Hyperlipidemia, mixed 08/11/2014   Preventative health care 04/02/2011   Sleep apnea - mild no CPAP recommended    Past Surgical History:  Procedure Laterality Date   CARDIAC CATHETERIZATION     CORONARY/GRAFT ACUTE MI REVASCULARIZATION N/A 01/20/2022   Procedure: Coronary/Graft Acute MI Revascularization;  Surgeon: Swaziland, Peter M, MD;  Location: [facility] INVASIVE CV LAB;  Service: Cardiovascular;  Laterality: N/A;   LEFT HEART CATH AND CORONARY ANGIOGRAPHY N/A 01/20/2022   Procedure: LEFT HEART CATH AND CORONARY ANGIOGRAPHY;  Surgeon: Swaziland, Peter M, MD;  Location: [facility] INVASIVE CV LAB;  Service: Cardiovascular;  Laterality: N/A;   Family History  Problem Relation Age of Onset   Heart disease Mother    Diabetes Mother         diet controlled DM   Other Father        bowel obstruction with resection, hyponatremia   Ulcers Father    Depression Brother    Cancer Paternal Grandmother        esophageal cancer   GER disease Paternal Grandmother     Social History   Tobacco Use   Smoking status: Never   Smokeless tobacco: Never  Substance Use Topics   Alcohol use: Yes    Comment: rare beer   Marital Status: Married  ROS  Review of Systems  Cardiovascular:  Negative for chest pain, dyspnea on exertion and leg swelling.  Neurological:  Positive for dizziness.   Objective      12/13/2022    9:16 AM 11/29/2022    2:05 PM 08/24/2022    4:06 PM  Vitals with BMI  Height 5\' 9"  5\' 9"  5\' 9"   Weight 151 lbs 152 lbs 158 lbs  BMI 22.29 22.44 23.32  Systolic 91 106 116  Diastolic 59 71 64  Pulse 64 64 66   Blood pressure (!) 91/59, pulse 64, height 5\' 9"  (1.753 m), weight 151 lb (68.5 kg), SpO2 100 %.  Physical Exam Neck:     Vascular: No carotid bruit or JVD.  Cardiovascular:     Rate and Rhythm: Normal rate and regular rhythm.     Pulses: Intact distal pulses.     Heart sounds: Normal heart sounds. No murmur heard.    No gallop.  Pulmonary:     Effort: Pulmonary effort is normal.     Breath sounds: Normal breath sounds.  Abdominal:     General: Bowel sounds are normal.     Palpations: Abdomen is soft.  Musculoskeletal:     Right lower leg: No edema.     Left lower leg: No edema.    Laboratory examination:   Recent Labs    01/22/22 0052 01/23/22 0143 02/04/22 1550 04/12/22 1012 04/14/22 1523 05/14/22 0850 05/28/22 0838 07/29/22 0958 08/05/22 0802  NA 137 139   < > 138   < > 135 140 138  --   K 3.7 3.9   < > 4.5   < > 4.8 5.1 5.6 No hemolysis seen* 4.9  CL 107 107   < > 106   < > 101 102 102  --   CO2 25 25   < > 23   < > 31 25 32  --   GLUCOSE 114* 87   < > 84   < > 87 89 87  --   BUN 8 11   < > 8   < > 13 10 11   --   CREATININE 0.83 0.92   < > 0.87   < > 0.89 0.89 0.87  --    CALCIUM 8.6* 8.7*   < > 9.0   < > 9.1 9.5 9.4  --   GFRNONAA >60 >60  --  >60  --   --   --   --   --    < > = values in this interval not displayed.    Lab Results  Component Value Date   GLUCOSE 87 07/29/2022   NA 138 07/29/2022   K 4.9 08/05/2022   CL 102 07/29/2022   CO2 32 07/29/2022   BUN 11 07/29/2022   CREATININE 0.87 07/29/2022   EGFR 100 05/28/2022   CALCIUM 9.4 07/29/2022   PHOS 3.7 09/21/2012   PROT 6.9 10/08/2022   ALBUMIN 4.5 10/08/2022   BILITOT 2.1 (H) 10/08/2022   ALKPHOS 95 10/08/2022   AST 13 10/08/2022   ALT 11 10/08/2022   ANIONGAP 9 04/12/2022      Lab Results  Component Value Date   ALT 11 10/08/2022   AST 13 10/08/2022   ALKPHOS 95 10/08/2022   BILITOT 2.1 (H) 10/08/2022       Latest Ref Rng & Units 10/08/2022    9:23 AM 07/29/2022    9:58 AM 05/14/2022    8:50 AM  Hepatic Function  Total Protein 6.0 - 8.5 g/dL 6.9  7.1  7.0   Albumin 3.8 - 4.9 g/dL 4.5  4.6  4.5   AST 0 - 40 IU/L 13  13  16    ALT 0 - 44 IU/L 11  12  16    Alk Phosphatase 44 - 121 IU/L 95  85  89   Total Bilirubin 0.0 - 1.2 mg/dL 2.1  1.8  1.9   Bilirubin, Direct 0.00 - 0.40 mg/dL 1.61      Lipid Panel Recent Labs    01/20/22 2245 02/22/22 0931 05/28/22 0838 10/08/22 0923  CHOL 218* 99* 133 90*  TRIG 134 82 98 81  LDLCALC 145* 49 70 24  VLDL 27  --   --   --   HDL 46 33* 45 49  CHOLHDL 4.7 3.0 3.0 1.8   Lipoprotein (a) <75.0 nmol/L 01/20/2022 153.8 High    HEMOGLOBIN A1C Lab Results  Component Value Date  HGBA1C 5.3 11/17/2022   MPG 102.54 01/20/2022   TSH Recent Labs    01/21/22 0634  TSH 1.993   Radiology:    Cardiac Studies:   ECHO COMPLETE WITH IMAGING ENHANCING AGENT 03/29/2022 1. Septal/apical akinesis inferior apical akinesis Mid/baseal inferior wall hypokinesis . Left ventricular ejection fraction, by estimation, is 30 to 35%. The left ventricle has moderately decreased function. The left ventricle demonstrates regional wall motion  abnormalities (see scoring diagram/findings for description). The left ventricular internal cavity size was moderately dilated. There is mild asymmetric left ventricular hypertrophy of the basal and septal segments. Left ventricular diastolic parameters were normal. 2. Right ventricular systolic function is normal. The right ventricular size is normal. 3. The mitral valve is abnormal. Mild mitral valve regurgitation. No evidence of mitral stenosis. 4. The aortic valve is tricuspid. There is mild calcification of the aortic valve. Aortic valve regurgitation is not visualized. Aortic valve sclerosis is present, with no evidence of aortic valve stenosis. 5. The inferior vena cava is normal in size with greater than 50% respiratory variability, suggesting right atrial pressure of 3 mmHg. 6. No significant change from 01/21/2022   Coronary angiogram 01/20/2022:   Prox LAD lesion is 100% stenosed.   Mid Cx to Dist Cx lesion is 20% stenosed with 20% stenosed side branch in LPAV.   A drug-eluting stent was successfully placed using a SYNERGY XD 3.50X24.   Post intervention, there is a 0% residual stenosis.   LV end diastolic pressure is moderately elevated.   Cardiac MRI 08/27/2022: LVEF 41% with evidence of prior LAD infarct. No significant valvular abnormality.   EKG:   EKG 12/13/2022: Normal sinus rhythm at rate of 70 bpm, normal axis, incomplete right bundle branch block.  Anteroseptal infarct old. No significant change from EKG 04/12/2022  Medications and allergies   Allergies  Allergen Reactions   Lipitor [Atorvastatin] Other (See Comments)    aches     Medication list   Current Outpatient Medications:    aspirin (ASPIRIN CHILDRENS) 81 MG chewable tablet, Chew 1 tablet (81 mg total) by mouth daily., Disp: , Rfl:    Evolocumab (REPATHA SURECLICK) 140 MG/ML SOAJ, Inject 140 mg into the skin every 14 (fourteen) days., Disp: 2 mL, Rfl: 11   nitroGLYCERIN (NITROSTAT) 0.4 MG SL tablet,  Place 1 tablet (0.4 mg total) under the tongue every 5 (five) minutes x 3 doses as needed for chest pain., Disp: 25 tablet, Rfl: 2   triamcinolone cream (KENALOG) 0.1 %, Apply 1 Application topically 2 (two) times daily., Disp: 30 g, Rfl: 1   dapagliflozin propanediol (FARXIGA) 5 MG TABS tablet, Take 1 tablet (5 mg total) by mouth daily., Disp: 90 tablet, Rfl: 3   sacubitril-valsartan (ENTRESTO) 24-26 MG, Take 1 tablet by mouth as directed. 1/2 tab twice daily, Disp: 180 tablet, Rfl: 1  Assessment     ICD-10-CM   1. Coronary artery disease of native artery of native heart with stable angina pectoris (HCC)  I25.118 EKG 12-Lead    aspirin (ASPIRIN CHILDRENS) 81 MG chewable tablet    PCV CARDIAC STRESS TEST    CANCELED: EKG 12-Lead    2. Ischemic cardiomyopathy  I25.5 CANCELED: EKG 12-Lead    3. Chronic systolic heart failure (HCC)  Z61.09 dapagliflozin propanediol (FARXIGA) 5 MG TABS tablet    sacubitril-valsartan (ENTRESTO) 24-26 MG    4. Hypercholesteremia  E78.00     5. Elevated Lp(a)  E78.41        Orders Placed This Encounter  Procedures   PCV CARDIAC STRESS TEST    Standing Status:   Future    Standing Expiration Date:   02/12/2023   EKG 12-Lead    Meds ordered this encounter  Medications   aspirin (ASPIRIN CHILDRENS) 81 MG chewable tablet    Sig: Chew 1 tablet (81 mg total) by mouth daily.    Medications Discontinued During This Encounter  Medication Reason   ticagrelor (BRILINTA) 90 MG TABS tablet Completed Course   aspirin EC 81 MG tablet Change in therapy   diltiazem 2 % GEL Completed Course   sacubitril-valsartan (ENTRESTO) 24-26 MG Reorder   dapagliflozin propanediol (FARXIGA) 10 MG TABS tablet Reorder     Recommendations:   Michael Gibbs is a 57 y.o. Asian male patient with stomach in July 2023 SB stenting to proximal LAD, hypercholesterolemia, statin intolerance presently on Repatha, mild OSA presents to establish care, previously seen by Peter  Swaziland, MD. he was unable to tolerate high-dose Entresto or spironolactone due to hyperkalemia and low blood pressure.  1. Coronary artery disease of native artery of native heart with stable angina pectoris [facility]) Patient with coronary disease and stable angina, occasionally has mild discomfort but overall feels well.  Advised him that he can resume full activity.  I will set him up for a routine treadmill exercise stress test to evaluate his functional status and to exclude any inducible arrhythmias.  I reviewed his chart, reviewed his imaging studies, extensive discussion with the patient that his LVEF has improved from around 30% to 41% by cardiac MR without significant valvular abnormality.  He has completed 1 year course of dual antiplatelet therapy, in the absence of any other significant coronary vessel disease, will discontinue Brilinta and continue chewable aspirin 81 mg daily indefinitely.  - EKG 12-Lead - aspirin (ASPIRIN CHILDRENS) 81 MG chewable tablet; Chew 1 tablet (81 mg total) by mouth daily. - PCV CARDIAC STRESS TEST; Future  2. Ischemic cardiomyopathy Patient has ischemic cardiomyopathy has scar tissue in the LAD territory by MRI.  He has not had any arrhythmic events.  He has had 1 episode of near syncope when he suddenly stood up, states that he continues to have occasional episodes of marked dizziness when he stands up he and his wife are concerned about low blood pressure as well.  I have reduced the dose of Entresto from 24/26 mg to 1/2 tablet twice daily and Farxiga from 10 mg to 5 mg daily..  3. Chronic systolic heart failure (HCC) Patient is well compensated. - dapagliflozin propanediol (FARXIGA) 5 MG TABS tablet; Take 1 tablet (5 mg total) by mouth daily.  Dispense: 90 tablet; Refill: 3 - sacubitril-valsartan (ENTRESTO) 24-26 MG; Take 1 tablet by mouth as directed. 1/2 tab twice daily  Dispense: 180 tablet; Refill: 1  4. Hypercholesteremia I reviewed his lipids, he  could not tolerate statins due to severe myalgias, presently on Repatha.  Continue the same.  5. Elevated Lp(a) Patient has elevated Lp(a) around 150, hopefully Repatha will have an effect in reducing at least to some degree.  We will look and see whether he would qualify for any of the clinical trials.  I will see him back in 6 months for follow-up.  Advised him that he is on best medical therapy and indeed if he decides to follow-up and continue to follow Dr. Peter Swaziland it would be appropriate as well.    Yates Decamp, MD, [facility] 12/13/2022, 10:25 PM Office: [phone]

## 2022-12-14 LAB — INSULIN, RANDOM: Insulin: 6.7 u[IU]/mL

## 2022-12-14 LAB — C-PEPTIDE: C-Peptide: 3.51 ng/mL (ref 0.80–3.85)

## 2022-12-20 ENCOUNTER — Ambulatory Visit: Payer: No Typology Code available for payment source

## 2022-12-20 DIAGNOSIS — I25118 Atherosclerotic heart disease of native coronary artery with other forms of angina pectoris: Secondary | ICD-10-CM

## 2022-12-21 NOTE — Progress Notes (Signed)
Exercise treadmill stress test 12/20/2022: Exercise treadmill stress test performed using Bruce protocol.  Patient exercised for a total of 9 minutes and 1 seconds, achieving 10.1 METS, and 96% of age predicted maximum heart rate.  Exercise capacity was excellent.  No chest pain reported.  Normal heart rate and hemodynamic response. Stress EKG revealed no ischemic changes. Low risk study.

## 2023-01-23 NOTE — Progress Notes (Unsigned)
Cardiology Office Note:    Date:  01/25/2023   ID:  Michael Gibbs, Michael Gibbs 02/18/1966, MRN [id number]  PCP:  Bradd Canary, MD   [facility] Providers Cardiologist:  Jaking Thayer Sw[place], MD     Referring MD: Bradd Canary, MD   No chief complaint on file.   History of Present Illness:    Michael Gibbs is a 57 y.o. male with a hx of CAD. He was admitted in July 2023 with STEMI.  LHC showed occluded proximal LAD, underwent successful PCI with DES to LAD.  Echocardiogram showed EF 30 to 35%.  He was started on DAPT with aspirin and Brilinta, as well as high intensity statin and beta-blocker and Farxiga.  He was not initially started on ACE/ARB/ARNI or MRA due to low BP.  Repeat echocardiogram 03/29/2022 showed EF remained 30 to 35%.  He  was eventually started on losartan 25 mg daily, Farxiga 10 mg daily, spironolactone 12.5 mg daily, aspirin and Brilinta.  He did develop significant myalgias on high dose lipitor. Also had some bilirubin elevation. These symptoms resolved off lipitor. He was seen by pharm D and started on Repatha. In Early Feb his labs showed elevated potassium level of 5.6. He was given a single dose of lokelma and aldactone was stopped. Repeat potassium was 4.9.  He did have a sleep study showing very mild sleep apnea. Conservative therapy recommended.   He was seen by Dr Jacinto Halim for second opinion. Stress test done. Walked 9 minutes on Bruce protocol with no chest pain, arrhythmia or ST changes. He his potentially going to enroll in a clinical trial for Lipoprotein A with Vonna Kotyk.   On follow up today he is doing well. He does note if he stands quickly he may get lightheaded. No chest pain or dyspnea. Feels well overall. Planning to work with a trainer to start doing more strength training. States he has lost muscle mass. Wife thinks he may have some hair loss related to [place].  Medications are affordable.     Past Medical History:  Diagnosis Date    Allergic state 04/25/2007   Seasonal allergies-well controlled with Allegra and Flonase     CAD (coronary artery disease)    a. 12/2021: s/p STEMI with DES to proximal LAD   Dermatitis 09/22/2011   Headache(784.0) 09/22/2011   HFrEF (heart failure with reduced ejection fraction) (HCC)    a. EF 30-35% in 12/2021 in the setting of anterior STEMI   Hyperlipidemia, mixed 08/11/2014   Preventative health care 04/02/2011   Sleep apnea - mild no CPAP recommended     Past Surgical History:  Procedure Laterality Date   CARDIAC CATHETERIZATION     CORONARY/GRAFT ACUTE MI REVASCULARIZATION N/A 01/20/2022   Procedure: Coronary/Graft Acute MI Revascularization;  Surgeon: Swaziland, Lashaun Poch M, MD;  Location: [facility] INVASIVE CV LAB;  Service: Cardiovascular;  Laterality: N/A;   LEFT HEART CATH AND CORONARY ANGIOGRAPHY N/A 01/20/2022   Procedure: LEFT HEART CATH AND CORONARY ANGIOGRAPHY;  Surgeon: Swaziland, Jadarrius Maselli M, MD;  Location: [facility] INVASIVE CV LAB;  Service: Cardiovascular;  Laterality: N/A;    Current Medications: Current Meds  Medication Sig   aspirin (ASPIRIN CHILDRENS) 81 MG chewable tablet Chew 1 tablet (81 mg total) by mouth daily.   dapagliflozin propanediol (FARXIGA) 5 MG TABS tablet Take 1 tablet (5 mg total) by mouth daily.   Evolocumab (REPATHA SURECLICK) 140 MG/ML SOAJ Inject 140 mg into the skin every 14 (fourteen) days.   sacubitril-valsartan (ENTRESTO)  24-26 MG Take 1 tablet by mouth as directed. 1/2 tab twice daily   triamcinolone cream (KENALOG) 0.1 % Apply 1 Application topically 2 (two) times daily.   [DISCONTINUED] nitroGLYCERIN (NITROSTAT) 0.4 MG SL tablet Place 1 tablet (0.4 mg total) under the tongue every 5 (five) minutes x 3 doses as needed for chest pain.     Allergies:   Lipitor [atorvastatin]   Social History   Socioeconomic History   Marital status: Married    Spouse name: Not on file   Number of children: 2   Years of education: 16   Highest education level: Master's  degree (e.g., MA, MS, MEng, MEd, MSW, MBA)  Occupational History   Not on file  Tobacco Use   Smoking status: Never   Smokeless tobacco: Never  Vaping Use   Vaping status: Not on file  Substance and Sexual Activity   Alcohol use: Yes    Comment: rare beer   Drug use: No   Sexual activity: Yes    Comment: lives with wife,  works in Consulting civil engineer, vegetarian, wears a seat baelt  Other Topics Concern   Not on file  Social History Narrative   Patient is from [place] but lives in [place], has been in the [place] since 1996, lives with wife and 2 children-ages 9 and 7, wife works from home, her office is in [place]. Remainder family lives in [place]. Patient works in Consulting civil engineer. Enjoys tennis. Vegetarian.   Social Determinants of Health   Financial Resource Strain: Not on file  Food Insecurity: Not on file  Transportation Needs: Not on file  Physical Activity: Not on file  Stress: Not on file  Social Connections: Not on file     Family History: The patient's family history includes Cancer in his paternal grandmother; Depression in his brother; Diabetes in his mother; GER disease in his paternal grandmother; Heart disease in his mother; Other in his father; Ulcers in his father.  ROS:   Please see the history of present illness.     All other systems reviewed and are negative.  EKGs/Labs/Other Studies Reviewed:    The following studies were reviewed today: Cardiac cath 01/20/22:  Coronary/Graft Acute MI Revascularization  LEFT HEART CATH AND CORONARY ANGIOGRAPHY   Conclusion      Prox LAD lesion is 100% stenosed.   Mid Cx to Dist Cx lesion is 20% stenosed with 20% stenosed side branch in LPAV.   A drug-eluting stent was successfully placed using a SYNERGY XD 3.50X24.   Post intervention, there is a 0% residual stenosis.   LV end diastolic pressure is moderately elevated.   Single vessel occlusive CAD involving the proximal LAD Moderately elevated LVEDP 30 mm Hg Successful PCI  of the proximal LAD with DES x 1.   Plan: DAPT for one year.   Diagnostic Dominance: Left  Intervention   Echo 03/29/22: IMPRESSIONS     1. Septal/apical akinesis inferior apical akinesis Mid/baseal inferior  wall hypokinesis . Left ventricular ejection fraction, by estimation, is  30 to 35%. The left ventricle has moderately decreased function. The left  ventricle demonstrates regional  wall motion abnormalities (see scoring diagram/findings for description).  The left ventricular internal cavity size was moderately dilated. There is  mild asymmetric left ventricular hypertrophy of the basal and septal  segments. Left ventricular diastolic   parameters were normal.   2. Right ventricular systolic function is normal. The right ventricular  size is normal.   3. The mitral valve  is abnormal. Mild mitral valve regurgitation. No  evidence of mitral stenosis.   4. The aortic valve is tricuspid. There is mild calcification of the  aortic valve. Aortic valve regurgitation is not visualized. Aortic valve  sclerosis is present, with no evidence of aortic valve stenosis.   5. The inferior vena cava is normal in size with greater than 50%  respiratory variability, suggesting right atrial pressure of 3 mmHg.    EKG:  EKG is not ordered today.    Recent Labs: 07/29/2022: BUN 11; Creatinine, Ser 0.87; Hemoglobin 15.2; Platelets 267.0; Sodium 138 08/05/2022: Potassium 4.9 10/08/2022: ALT 11  Recent Lipid Panel    Component Value Date/Time   CHOL 90 (L) 10/08/2022 0923   TRIG 81 10/08/2022 0923   HDL 49 10/08/2022 0923   CHOLHDL 1.8 10/08/2022 0923   CHOLHDL 4.7 01/20/2022 2245   VLDL 27 01/20/2022 2245   LDLCALC 24 10/08/2022 0923   LDLCALC 132 (H) 04/15/2020 1155   LDLDIRECT 110 (H) 04/25/2007 1845     Risk Assessment/Calculations:      Physical Exam:    VS:  BP (!) 88/78   Pulse 68   Ht 5\' 9"  (1.753 m)   Wt 153 lb (69.4 kg)   SpO2 98%   BMI 22.59 kg/m     Wt Readings  from Last 3 Encounters:  01/25/23 153 lb (69.4 kg)  12/13/22 151 lb (68.5 kg)  11/29/22 152 lb (68.9 kg)     GEN:  Well nourished, well developed in no acute distress HEENT: Normal NECK: No JVD; No carotid bruits LYMPHATICS: No lymphadenopathy CARDIAC: RRR, no murmurs, rubs, gallops RESPIRATORY:  Clear to auscultation without rales, wheezing or rhonchi  ABDOMEN: Soft, non-tender, non-distended MUSCULOSKELETAL:  No edema; No deformity  SKIN: Warm and dry NEUROLOGIC:  Alert and oriented x 3 PSYCHIATRIC:  Normal affect   ASSESSMENT:    1. Coronary artery disease of native artery of native heart with stable angina pectoris (HCC)   2. Ischemic cardiomyopathy   3. Chronic systolic heart failure (HCC)   4. Hypercholesteremia   5. Elevated Lp(a)   6. History of ST elevation myocardial infarction (STEMI)       PLAN:    In order of problems listed above:  CAD s/p acute anterior STEMI in July with emergent stenting of the LAD. Troponin > 24,000. Ecg with persistent Q waves. EF 30-35%. On ASA only now. Not on beta blocker due to low resting HR. He is asymptomatic. Continue regular exercise and healthy diet. Really did well on recent ETT.  Chronic systolic CHF secondary to ischemic CM. Now on Entresto,  Farxiga. Had to stop aldactone due to hyperkalemia. Not on beta blocker due to slow HR. Given savings card for [facility]. Cardiac MRI showed EF 41% so above indication for ICD.  Hypercholesterolemia. Did not tolerate  statin. Now   On Repatha now. LDL excellent 24. I think it is great he is being considered for clinical trial.   Follow up in 6 months      Signed, Itzabella Sorrels Swaziland, MD  01/25/2023 5:19 PM    [facility]

## 2023-01-25 ENCOUNTER — Ambulatory Visit: Payer: No Typology Code available for payment source | Attending: Cardiology | Admitting: Cardiology

## 2023-01-25 ENCOUNTER — Encounter: Payer: Self-pay | Admitting: Cardiology

## 2023-01-25 VITALS — BP 88/78 | HR 68 | Ht 69.0 in | Wt 153.0 lb

## 2023-01-25 DIAGNOSIS — I5022 Chronic systolic (congestive) heart failure: Secondary | ICD-10-CM

## 2023-01-25 DIAGNOSIS — E7800 Pure hypercholesterolemia, unspecified: Secondary | ICD-10-CM

## 2023-01-25 DIAGNOSIS — E7841 Elevated Lipoprotein(a): Secondary | ICD-10-CM

## 2023-01-25 DIAGNOSIS — I255 Ischemic cardiomyopathy: Secondary | ICD-10-CM | POA: Diagnosis not present

## 2023-01-25 DIAGNOSIS — I25118 Atherosclerotic heart disease of native coronary artery with other forms of angina pectoris: Secondary | ICD-10-CM | POA: Diagnosis not present

## 2023-01-25 DIAGNOSIS — I252 Old myocardial infarction: Secondary | ICD-10-CM

## 2023-01-25 MED ORDER — NITROGLYCERIN 0.4 MG SL SUBL: 0.4000 mg | SUBLINGUAL_TABLET | SUBLINGUAL | 2 refills | Status: AC | PRN

## 2023-01-25 NOTE — Patient Instructions (Signed)
Medication Instructions:  Continue same medications *If you need a refill on your cardiac medications before your next appointment, please call your pharmacy*   Lab Work: None ordered   Testing/Procedures: None ordered   Follow-Up: At [facility], you and your health needs are our priority.  As part of our continuing mission to provide you with exceptional heart care, we have created designated Provider Care Teams.  These Care Teams include your primary Cardiologist (physician) and Advanced Practice Providers (APPs -  Physician Assistants and Nurse Practitioners) who all work together to provide you with the care you need, when you need it.  We recommend signing up for the patient portal called "MyChart".  Sign up information is provided on this After Visit Summary.  MyChart is used to connect with patients for Virtual Visits (Telemedicine).  Patients are able to view lab/test results, encounter notes, upcoming appointments, etc.  Non-urgent messages can be sent to your provider as well.   To learn more about what you can do with MyChart, go to [email or web address].    Your next appointment:  6 months    Call in Sept to schedule Jan appointment     Provider:  Dr.Jordan   

## 2023-02-21 ENCOUNTER — Ambulatory Visit: Payer: No Typology Code available for payment source | Admitting: Gastroenterology

## 2023-03-15 ENCOUNTER — Encounter: Payer: Self-pay | Admitting: Cardiology

## 2023-03-15 ENCOUNTER — Ambulatory Visit: Payer: No Typology Code available for payment source | Admitting: Cardiology

## 2023-03-15 VITALS — BP 97/62 | HR 71 | Resp 16 | Ht 69.0 in | Wt 154.0 lb

## 2023-03-15 DIAGNOSIS — E7800 Pure hypercholesterolemia, unspecified: Secondary | ICD-10-CM

## 2023-03-15 DIAGNOSIS — I255 Ischemic cardiomyopathy: Secondary | ICD-10-CM

## 2023-03-15 DIAGNOSIS — I25118 Atherosclerotic heart disease of native coronary artery with other forms of angina pectoris: Secondary | ICD-10-CM

## 2023-03-15 DIAGNOSIS — E7841 Elevated Lipoprotein(a): Secondary | ICD-10-CM

## 2023-03-15 MED ORDER — CARVEDILOL 3.125 MG PO TABS: 3.1250 mg | ORAL_TABLET | Freq: Two times a day (BID) | ORAL | 1 refills | Status: DC

## 2023-03-15 NOTE — Progress Notes (Signed)
Primary Physician/Referring:  Bradd Canary, MD  [name], male    DOB: [birth year], 56 y.o.   MRN: [id number]  Chief Complaint  Patient presents with   Coronary artery disease of native artery of native heart wi   Cardiomyopathy   Follow-up    3 months   HPI:    Michael Gibbs  is a 57 y.o.  Asian male patient with STEMI  in July 2023 S/P stenting to proximal LAD, moderate LV systolic dysfunction, hypercholesterolemia, statin intolerance presently on Repatha, mild OSA presents for a 48-month office visit.  He was unable to tolerate high-dose Entresto or spironolactone due to hyperkalemia and low blood pressure.No chest pain and he has not used any sublingual nitroglycerin.  He remains asymptomatic.  Past Medical History:  Diagnosis Date   Allergic state 04/25/2007   Seasonal allergies-well controlled with Allegra and Flonase     CAD (coronary artery disease)    a. 12/2021: s/p STEMI with DES to proximal LAD   Dermatitis 09/22/2011   Headache(784.0) 09/22/2011   HFrEF (heart failure with reduced ejection fraction) (HCC)    a. EF 30-35% in 12/2021 in the setting of anterior STEMI   Hyperlipidemia, mixed 08/11/2014   Preventative health care 04/02/2011   Sleep apnea - mild no CPAP recommended    Past Surgical History:  Procedure Laterality Date   CARDIAC CATHETERIZATION     CORONARY/GRAFT ACUTE MI REVASCULARIZATION N/A 01/20/2022   Procedure: Coronary/Graft Acute MI Revascularization;  Surgeon: Swaziland, Peter M, MD;  Location: [facility] INVASIVE CV LAB;  Service: Cardiovascular;  Laterality: N/A;   LEFT HEART CATH AND CORONARY ANGIOGRAPHY N/A 01/20/2022   Procedure: LEFT HEART CATH AND CORONARY ANGIOGRAPHY;  Surgeon: Swaziland, Peter M, MD;  Location: [facility] INVASIVE CV LAB;  Service: Cardiovascular;  Laterality: N/A;   Family History  Problem Relation Age of Onset   Heart disease Mother    Diabetes Mother        diet controlled DM   Other Father         bowel obstruction with resection, hyponatremia   Ulcers Father    Depression Brother    Cancer Paternal Grandmother        esophageal cancer   GER disease Paternal Grandmother     Social History   Tobacco Use   Smoking status: Never   Smokeless tobacco: Never  Substance Use Topics   Alcohol use: Yes    Comment: rare beer   Marital Status: Married  ROS  Review of Systems  Cardiovascular:  Negative for chest pain, dyspnea on exertion and leg swelling.   Objective      03/15/2023    3:57 PM 01/25/2023    4:34 PM 12/13/2022    9:16 AM  Vitals with BMI  Height 5\' 9"  5\' 9"  5\' 9"   Weight 154 lbs 153 lbs 151 lbs  BMI 22.73 22.58 22.29  Systolic 97 88 91  Diastolic 62 78 59  Pulse 71 68 64   Blood pressure 97/62, pulse 71, resp. rate 16, height 5\' 9"  (1.753 m), weight 154 lb (69.9 kg), SpO2 99%.  Physical Exam Neck:     Vascular: No carotid bruit or JVD.  Cardiovascular:     Rate and Rhythm: Normal rate and regular rhythm.     Pulses: Intact distal pulses.     Heart sounds: Normal heart sounds. No murmur heard.    No gallop.  Pulmonary:     Effort: Pulmonary effort is  normal.     Breath sounds: Normal breath sounds.  Abdominal:     General: Bowel sounds are normal.     Palpations: Abdomen is soft.  Musculoskeletal:     Right lower leg: No edema.     Left lower leg: No edema.    Laboratory examination:   Recent Labs    04/12/22 1012 04/14/22 1523 05/14/22 0850 05/28/22 0838 07/29/22 0958 08/05/22 0802  NA 138   < > 135 140 138  --   K 4.5   < > 4.8 5.1 5.6 No hemolysis seen* 4.9  CL 106   < > 101 102 102  --   CO2 23   < > 31 25 32  --   GLUCOSE 84   < > 87 89 87  --   BUN 8   < > 13 10 11   --   CREATININE 0.87   < > 0.89 0.89 0.87  --   CALCIUM 9.0   < > 9.1 9.5 9.4  --   GFRNONAA >60  --   --   --   --   --    < > = values in this interval not displayed.    Lab Results  Component Value Date   GLUCOSE 87 07/29/2022   NA 138 07/29/2022   K 4.9  08/05/2022   CL 102 07/29/2022   CO2 32 07/29/2022   BUN 11 07/29/2022   CREATININE 0.87 07/29/2022   EGFR 100 05/28/2022   CALCIUM 9.4 07/29/2022   PHOS 3.7 09/21/2012   PROT 6.9 10/08/2022   ALBUMIN 4.5 10/08/2022   BILITOT 2.1 (H) 10/08/2022   ALKPHOS 95 10/08/2022   AST 13 10/08/2022   ALT 11 10/08/2022   ANIONGAP 9 04/12/2022      Lab Results  Component Value Date   ALT 11 10/08/2022   AST 13 10/08/2022   ALKPHOS 95 10/08/2022   BILITOT 2.1 (H) 10/08/2022       Latest Ref Rng & Units 10/08/2022    9:23 AM 07/29/2022    9:58 AM 05/14/2022    8:50 AM  Hepatic Function  Total Protein 6.0 - 8.5 g/dL 6.9  7.1  7.0   Albumin 3.8 - 4.9 g/dL 4.5  4.6  4.5   AST 0 - 40 IU/L 13  13  16    ALT 0 - 44 IU/L 11  12  16    Alk Phosphatase 44 - 121 IU/L 95  85  89   Total Bilirubin 0.0 - 1.2 mg/dL 2.1  1.8  1.9   Bilirubin, Direct 0.00 - 0.40 mg/dL 1.61      Lipid Panel Recent Labs    05/28/22 0838 10/08/22 0923  CHOL 133 90*  TRIG 98 81  LDLCALC 70 24  HDL 45 49  CHOLHDL 3.0 1.8   Lipoprotein (a) <75.0 nmol/L 01/20/2022 153.8 High    HEMOGLOBIN A1C Lab Results  Component Value Date   HGBA1C 5.3 11/17/2022   MPG 102.54 01/20/2022   TSH No results for input(s): "TSH" in the last 8760 hours.  Radiology:    Cardiac Studies:   ECHO COMPLETE WITH IMAGING ENHANCING AGENT 03/29/2022 1. Septal/apical akinesis inferior apical akinesis Mid/baseal inferior wall hypokinesis . Left ventricular ejection fraction, by estimation, is 30 to 35%. The left ventricle has moderately decreased function. The left ventricle demonstrates regional wall motion abnormalities (see scoring diagram/findings for description). The left ventricular internal cavity size was moderately dilated. There is mild asymmetric left  ventricular hypertrophy of the basal and septal segments. Left ventricular diastolic parameters were normal. 2. Right ventricular systolic function is normal. The right  ventricular size is normal. 3. The mitral valve is abnormal. Mild mitral valve regurgitation. No evidence of mitral stenosis. 4. The aortic valve is tricuspid. There is mild calcification of the aortic valve. Aortic valve regurgitation is not visualized. Aortic valve sclerosis is present, with no evidence of aortic valve stenosis. 5. The inferior vena cava is normal in size with greater than 50% respiratory variability, suggesting right atrial pressure of 3 mmHg. 6. No significant change from 01/21/2022   Coronary angiogram 01/20/2022:   Prox LAD lesion is 100% stenosed.   Mid Cx to Dist Cx lesion is 20% stenosed with 20% stenosed side branch in LPAV.   A drug-eluting stent was successfully placed using a SYNERGY [id number] [id number].   Post intervention, there is a 0% residual stenosis.   LV end diastolic pressure is moderately elevated.   Cardiac MRI 08/27/2022: LVEF 41% with evidence of prior LAD infarct. No significant valvular abnormality.   Exercise treadmill stress test 12/20/2022: Exercise treadmill stress test performed using Bruce protocol.  Patient exercised for a total of 9 minutes and 1 seconds, achieving 10.1 METS, and 96% of age predicted maximum heart rate.  Exercise capacity was excellent.  No chest pain reported.  Normal heart rate and hemodynamic response. Stress EKG revealed no ischemic changes. Low risk study.  EKG:   EKG 12/13/2022: Normal sinus rhythm at rate of 70 bpm, normal axis, incomplete right bundle branch block.  Anteroseptal infarct old. No significant change from EKG 04/12/2022  Medications and allergies   Allergies  Allergen Reactions   Lipitor [Atorvastatin] Other (See Comments)    aches     Medication list   Current Outpatient Medications:    aspirin (ASPIRIN CHILDRENS) 81 MG chewable tablet, Chew 1 tablet (81 mg total) by mouth daily., Disp: , Rfl:    carvedilol (COREG) 3.125 MG tablet, Take 1 tablet (3.125 mg total) by mouth 2 (two) times daily with a  meal., Disp: 60 tablet, Rfl: 1   Evolocumab (REPATHA SURECLICK) 140 MG/ML SOAJ, Inject 140 mg into the skin every 14 (fourteen) days., Disp: 2 mL, Rfl: 11   nitroGLYCERIN (NITROSTAT) 0.4 MG SL tablet, Place 1 tablet (0.4 mg total) under the tongue every 5 (five) minutes x 3 doses as needed for chest pain., Disp: 25 tablet, Rfl: 2   sacubitril-valsartan (ENTRESTO) 24-26 MG, Take 1 tablet by mouth 2 (two) times daily., Disp: 180 tablet, Rfl: 1   triamcinolone cream (KENALOG) 0.1 %, Apply 1 Application topically 2 (two) times daily., Disp: 30 g, Rfl: 1  Assessment     ICD-10-CM   1. Coronary artery disease of native artery of native heart with stable angina pectoris (HCC)  I25.118 ECHOCARDIOGRAM COMPLETE    carvedilol (COREG) 3.125 MG tablet    2. Ischemic cardiomyopathy  I25.5 ECHOCARDIOGRAM COMPLETE    carvedilol (COREG) 3.125 MG tablet    3. Hypercholesteremia  E78.00     4. Elevated Lp(a)  E78.41      Orders Placed This Encounter  Procedures   ECHOCARDIOGRAM COMPLETE    Standing Status:   Future    Standing Expiration Date:   03/14/2024    Scheduling Instructions:     Prior to next visit    Order Specific Question:   Where should this test be performed    Answer:   [facility])  Order Specific Question:   Does the patient weigh less than or greater than 250 lbs?    Answer:   Patient weighs less than 250 lbs    Order Specific Question:   Perflutren DEFINITY (image enhancing agent) should be administered unless hypersensitivity or allergy exist    Answer:   Administer Perflutren    Order Specific Question:   Reason for exam-Echo    Answer:   Cardiomyopathy-Ischemic I25.5   Meds ordered this encounter  Medications   carvedilol (COREG) 3.125 MG tablet    Sig: Take 1 tablet (3.125 mg total) by mouth 2 (two) times daily with a meal.    Dispense:  60 tablet    Refill:  1   Medications Discontinued During This Encounter  Medication Reason   dapagliflozin  propanediol (FARXIGA) 5 MG TABS tablet Discontinued by provider      Recommendations:   Michael Gibbs is a 57 y.o. Asian male patient with STEMI  in July 2023 S/P stenting to proximal LAD, moderate LV systolic dysfunction, hypercholesterolemia, statin intolerance presently on Repatha, mild OSA presents for a 2-month office visit.  He was unable to tolerate high-dose Entresto or spironolactone due to hyperkalemia and low blood pressure.  1. Coronary artery disease of native artery of native heart with stable angina pectoris [facility]) Patient is presently doing well and is tolerating Entresto low-dose, blood pressure continues to be soft.  He was not on a beta-blocker, will try carvedilol 3.125 mg twice daily.  Reviewed his previously performed test, will repeat his echocardiogram in 6 months prior to his next office visit.  - ECHOCARDIOGRAM COMPLETE; Future - carvedilol (COREG) 3.125 MG tablet; Take 1 tablet (3.125 mg total) by mouth 2 (two) times daily with a meal.  Dispense: 60 tablet; Refill: 1  2. Ischemic cardiomyopathy Patient has no clinical evidence of heart failure, presently asymptomatic. - ECHOCARDIOGRAM COMPLETE; Future - carvedilol (COREG) 3.125 MG tablet; Take 1 tablet (3.125 mg total) by mouth 2 (two) times daily with a meal.  Dispense: 60 tablet; Refill: 1  3. Hypercholesteremia Reviewed his lipids, LDL is at goal.  He is presently on Repatha, he was screened for Lp(a) trial as his Lp(a) was >150 however with initiation of Repatha his Lp(a) on screening labs was less than 750 hence did not qualify for the trial.  4. Elevated Lp(a) Presently being appropriately treated with Repatha as patient could not tolerate statins.  I will see him back in 6 months for follow-up.     Michael Decamp, MD, [facility] 03/19/2023, 9:01 PM Office: [phone]

## 2023-03-19 ENCOUNTER — Encounter: Payer: Self-pay | Admitting: Cardiology

## 2023-03-23 ENCOUNTER — Telehealth: Payer: Self-pay

## 2023-03-23 NOTE — Telephone Encounter (Signed)
Called pt lvm she was due to complete Cologuard  And we have ordered it.

## 2023-04-30 ENCOUNTER — Other Ambulatory Visit: Payer: Self-pay | Admitting: Family Medicine

## 2023-05-08 ENCOUNTER — Other Ambulatory Visit: Payer: Self-pay | Admitting: Cardiology

## 2023-05-08 DIAGNOSIS — I5022 Chronic systolic (congestive) heart failure: Secondary | ICD-10-CM

## 2023-05-09 ENCOUNTER — Other Ambulatory Visit: Payer: Self-pay | Admitting: Cardiology

## 2023-05-09 DIAGNOSIS — I255 Ischemic cardiomyopathy: Secondary | ICD-10-CM

## 2023-05-09 DIAGNOSIS — I25118 Atherosclerotic heart disease of native coronary artery with other forms of angina pectoris: Secondary | ICD-10-CM

## 2023-05-11 ENCOUNTER — Telehealth: Payer: Self-pay | Admitting: Gastroenterology

## 2023-05-11 ENCOUNTER — Other Ambulatory Visit: Payer: Self-pay | Admitting: Cardiology

## 2023-05-11 ENCOUNTER — Other Ambulatory Visit: Payer: Self-pay

## 2023-05-11 DIAGNOSIS — I2102 ST elevation (STEMI) myocardial infarction involving left anterior descending coronary artery: Secondary | ICD-10-CM

## 2023-05-11 DIAGNOSIS — I5022 Chronic systolic (congestive) heart failure: Secondary | ICD-10-CM

## 2023-05-11 DIAGNOSIS — E785 Hyperlipidemia, unspecified: Secondary | ICD-10-CM

## 2023-05-11 DIAGNOSIS — I2510 Atherosclerotic heart disease of native coronary artery without angina pectoris: Secondary | ICD-10-CM

## 2023-05-11 MED ORDER — ENTRESTO 24-26 MG PO TABS: 1.0000 | ORAL_TABLET | Freq: Two times a day (BID) | ORAL | 2 refills | Status: DC

## 2023-05-11 NOTE — Telephone Encounter (Signed)
Good Afternoon Dr Barron Alvine   We received a call from [facility] requesting to transfer care back to [place] to schedule for a colonoscopy.   Patient recently seen [facility] earlier this year but is currently located in [place].   Office visit notes are available in MyChart. Please review and advise on scheduling.

## 2023-06-10 NOTE — Telephone Encounter (Signed)
Patient will call back and schedule

## 2023-07-11 ENCOUNTER — Other Ambulatory Visit: Payer: Self-pay

## 2023-07-11 DIAGNOSIS — I255 Ischemic cardiomyopathy: Secondary | ICD-10-CM

## 2023-07-11 DIAGNOSIS — I25118 Atherosclerotic heart disease of native coronary artery with other forms of angina pectoris: Secondary | ICD-10-CM

## 2023-07-11 MED ORDER — CARVEDILOL 3.125 MG PO TABS: 3.1250 mg | ORAL_TABLET | Freq: Two times a day (BID) | ORAL | 2 refills | Status: DC

## 2023-07-11 MED ORDER — CARVEDILOL 3.125 MG PO TABS: 3.1250 mg | ORAL_TABLET | Freq: Two times a day (BID) | ORAL | 2 refills | Status: AC

## 2023-07-11 NOTE — Addendum Note (Signed)
 Addended by: Margaret Pyle D on: 07/11/2023 04:05 PM   Modules accepted: Orders

## 2023-10-27 ENCOUNTER — Ambulatory Visit: Payer: No Typology Code available for payment source | Attending: Cardiology | Admitting: Cardiology

## 2023-10-27 ENCOUNTER — Encounter: Payer: Self-pay | Admitting: Cardiology

## 2023-10-27 VITALS — BP 109/70 | HR 69 | Ht 69.0 in | Wt 164.0 lb

## 2023-10-27 DIAGNOSIS — E7800 Pure hypercholesterolemia, unspecified: Secondary | ICD-10-CM | POA: Diagnosis not present

## 2023-10-27 DIAGNOSIS — I2510 Atherosclerotic heart disease of native coronary artery without angina pectoris: Secondary | ICD-10-CM | POA: Diagnosis not present

## 2023-10-27 DIAGNOSIS — E7841 Elevated Lipoprotein(a): Secondary | ICD-10-CM | POA: Diagnosis not present

## 2023-10-27 DIAGNOSIS — I5022 Chronic systolic (congestive) heart failure: Secondary | ICD-10-CM | POA: Diagnosis not present

## 2023-10-27 DIAGNOSIS — I25118 Atherosclerotic heart disease of native coronary artery with other forms of angina pectoris: Secondary | ICD-10-CM

## 2023-10-27 NOTE — Patient Instructions (Signed)
 Medication Instructions:  Your physician recommends that you continue on your current medications as directed. Please refer to the Current Medication list given to you today.  *If you need a refill on your cardiac medications before your next appointment, please call your pharmacy*  Lab Work: none If you have labs (blood work) drawn today and your tests are completely normal, you will receive your results only by: MyChart Message (if you have MyChart) OR A paper copy in the mail If you have any lab test that is abnormal or we need to change your treatment, we will call you to review the results.  Testing/Procedures: Your physician has requested that you have an echocardiogram. Echocardiography is a painless test that uses sound waves to create images of your heart. It provides your doctor with information about the size and shape of your heart and how well your heart's chambers and valves are working. This procedure takes approximately one hour. There are no restrictions for this procedure. Please do NOT wear cologne, perfume, aftershave, or lotions (deodorant is allowed). Please arrive 15 minutes prior to your appointment time.  Please note: We ask at that you not bring children with you during ultrasound (echo/ vascular) testing. Due to room size and safety concerns, children are not allowed in the ultrasound rooms during exams. Our front office staff cannot provide observation of children in our lobby area while testing is being conducted. An adult accompanying a patient to their appointment will only be allowed in the ultrasound room at the discretion of the ultrasound technician under special circumstances. We apologize for any inconvenience.   Follow-Up: At [facility], you and your health needs are our priority.  As part of our continuing mission to provide you with exceptional heart care, our providers are all part of one team.  This team includes your primary Cardiologist  (physician) and Advanced Practice Providers or APPs (Physician Assistants and Nurse Practitioners) who all work together to provide you with the care you need, when you need it.  Your next appointment:   12 month(s)  Provider:   Knox Perl, MD    We recommend signing up for the patient portal called "MyChart".  Sign up information is provided on this After Visit Summary.  MyChart is used to connect with patients for Virtual Visits (Telemedicine).  Patients are able to view lab/test results, encounter notes, upcoming appointments, etc.  Non-urgent messages can be sent to your provider as well.   To learn more about what you can do with MyChart, go to [email or web address].   Other Instructions

## 2023-10-27 NOTE — Progress Notes (Signed)
 Cardiology Office Note:  .   Date:  10/28/2023  ID:  Michael Gibbs, DOB 08-06-1965, MRN [id number] PCP: Neda Balk, MD  [facility] Providers Cardiologist:  Knox Perl, MD   History of Present Illness: .   Michael Gibbs is a 58 y.o.  Asian male patient with STEMI  in July 2023 S/P stenting to proximal LAD, moderate LV systolic dysfunction, hypercholesterolemia, statin intolerance presently on Repatha , mild OSA presents for a 30-month office visit.   Discussed the use of AI scribe software for clinical note transcription with the patient, who gave verbal consent to proceed.  History of Present Illness Michael Gibbs "Michael Gibbs" is a 58 year old male with coronary artery disease and chronic systolic heart failure who presents for a cardiovascular follow-up.  He is accompanied by his wife. He has not been exercising regularly since January 2025 due to travel and severe pollen allergies. He last exercised in January and has not resumed his routine since returning from trips. Severe allergies to pollen prevent outdoor activity. He is prescribed Allegra D but has not been taking any daily allergy medication.  He has coronary artery disease with a history of myocardial infarction. His EKG shows an unchanged anterolateral infarct. He takes a daily baby aspirin . No new medications have been prescribed recently. He has chronic systolic heart failure with a reduced ejection fraction of 35%. He is on low doses of Entresto  and carvedilol  due to hypotension.  His cholesterol was last checked in April 2024, and he is on Repatha  injections. No lab tests have been conducted in the past year.  Labs   Lab Results  Component Value Date   CHOL 90 (L) 10/08/2022   HDL 49 10/08/2022   LDLCALC 24 10/08/2022   LDLDIRECT 110 (H) 04/25/2007   TRIG 81 10/08/2022   CHOLHDL 1.8 10/08/2022   Lab Results  Component Value Date   NA 138 07/29/2022   K 4.9 08/05/2022   CO2 32  07/29/2022   GLUCOSE 87 07/29/2022   BUN 11 07/29/2022   CREATININE 0.87 07/29/2022   CALCIUM  9.4 07/29/2022   GFR 95.72 07/29/2022   EGFR 100 05/28/2022   GFRNONAA >60 04/12/2022      Latest Ref Rng & Units 08/05/2022    8:02 AM 07/29/2022    9:58 AM 05/28/2022    8:38 AM  BMP  Glucose 70 - 99 mg/dL  87  89   BUN 6 - 23 mg/dL  11  10   Creatinine 4.09 - 1.50 mg/dL  8.11  9.14   BUN/Creat Ratio 9 - 20   11   Sodium 135 - 145 mEq/L  138  140   Potassium 3.5 - 5.1 mEq/L 4.9  5.6 No hemolysis seen  5.1   Chloride 96 - 112 mEq/L  102  102   CO2 19 - 32 mEq/L  32  25   Calcium  8.4 - 10.5 mg/dL  9.4  9.5       Latest Ref Rng & Units 07/29/2022    9:58 AM 05/28/2022    8:38 AM 04/12/2022   10:12 AM  CBC  WBC 4.0 - 10.5 K/uL 6.5  7.3  5.8   Hemoglobin 13.0 - 17.0 g/dL 78.2  95.6  21.3   Hematocrit 39.0 - 52.0 % 44.5  44.4  43.8   Platelets 150.0 - 400.0 K/uL 267.0  260  233    Lab Results  Component Value Date   HGBA1C 5.3 11/17/2022  Lab Results  Component Value Date   TSH 1.993 01/21/2022    Lipoprotein (a)  01/20/2022: 153.8 High   ROS  Review of Systems  Cardiovascular:  Negative for chest pain, dyspnea on exertion and leg swelling.    Physical Exam:   VS:  BP 109/70 (BP Location: Left Arm, Patient Position: Sitting, Cuff Size: Normal)   Pulse 69   Ht 5\' 9"  (1.753 m)   Wt 164 lb (74.4 kg)   SpO2 99%   BMI 24.22 kg/m    Wt Readings from Last 3 Encounters:  10/27/23 164 lb (74.4 kg)  03/15/23 154 lb (69.9 kg)  01/25/23 153 lb (69.4 kg)    Physical Exam Neck:     Vascular: No carotid bruit or JVD.  Cardiovascular:     Rate and Rhythm: Normal rate and regular rhythm.     Pulses: Intact distal pulses.     Heart sounds: Normal heart sounds. No murmur heard.    No gallop.  Pulmonary:     Effort: Pulmonary effort is normal.     Breath sounds: Normal breath sounds.  Abdominal:     General: Bowel sounds are normal.     Palpations: Abdomen is soft.   Musculoskeletal:     Right lower leg: No edema.     Left lower leg: No edema.    Studies Reviewed: Aaron Aas     EKG:    EKG Interpretation Date/Time:  Thursday Oct 27 2023 15:50:21 EDT Ventricular Rate:  69 PR Interval:  148 QRS Duration:  94 QT Interval:  382 QTC Calculation: 409 R Axis:   83  Text Interpretation: EKG 10/27/2023: Normal sinus rhythm at a rate of 69 bpm, normal axis, anterolateral infarct old.  Compared to 04/12/2022, no significant change. Confirmed by Joren Rehm, Jagadeesh ([id number]) on 10/27/2023 4:14:10 PM    Medications and allergies    Allergies  Allergen Reactions   Lipitor  [Atorvastatin ] Other (See Comments)    aches     Current Outpatient Medications:    aspirin  (ASPIRIN  CHILDRENS) 81 MG chewable tablet, Chew 1 tablet (81 mg total) by mouth daily., Disp: , Rfl:    carvedilol  (COREG ) 3.125 MG tablet, Take 1 tablet (3.125 mg total) by mouth 2 (two) times daily with a meal., Disp: 180 tablet, Rfl: 2   Evolocumab  (REPATHA  SURECLICK) 140 MG/ML SOAJ, INJECT 140 MG INTO THE SKIN EVERY 14 DAYS, Disp: 2 mL, Rfl: 11   nitroGLYCERIN  (NITROSTAT ) 0.4 MG SL tablet, Place 1 tablet (0.4 mg total) under the tongue every 5 (five) minutes x 3 doses as needed for chest pain., Disp: 25 tablet, Rfl: 2   sacubitril-valsartan (ENTRESTO ) 24-26 MG, Take 1 tablet by mouth 2 (two) times daily., Disp: 180 tablet, Rfl: 2   triamcinolone  cream (KENALOG ) 0.1 %, APPLY  CREAM EXTERNALLY TO AFFECTED AREA TWICE DAILY, Disp: 30 g, Rfl: 0   No orders of the defined types were placed in this encounter.    There are no discontinued medications.   ASSESSMENT AND PLAN: .      ICD-10-CM   1. Coronary artery disease involving native coronary artery of native heart without angina pectoris  I25.10 ECHOCARDIOGRAM COMPLETE    2. Chronic systolic heart failure (HCC)  Z61.09 ECHOCARDIOGRAM COMPLETE    3. Hypercholesteremia  E78.00 EKG 12-Lead    4. Elevated Lp(a)  E78.41      Assessment &  Plan Coronary artery disease with myocardial infarction   Coronary artery disease is present with an unchanged anterolateral  infarct on EKG. Exercise is emphasized for heart health and remodeling, as individuals with low ejection fractions can maintain active lives with regimen adherence. Continue daily baby aspirin . Order an echocardiogram to assess heart function. Encourage regular exercise to support heart remodeling.  Chronic systolic heart failure   Chronic systolic heart failure with a reduced ejection fraction of 35% is noted. No acute symptoms such as dyspnea, peripheral edema, or hepatomegaly are present. He is well-managed on current medications with low doses due to hypotension. Despite the low ejection fraction, the absence of heart failure symptoms indicates good cardiac compensation. Continue Entresto  and carvedilol  at current low doses. Emphasize the importance of regular exercise to support heart remodeling.  Will order echocardiogram to follow-up on his LVEF.  Hypercholesterolemia Presently on Repatha , last year's labs reveal well-controlled lipids, he has follow-up appointment with Dr. Randie Bustle for complete physical.  He also has elevated Lp(a) at 153, hence Repatha  is appropriate as well.  Allergic rhinitis due to pollen   Allergic rhinitis is exacerbated by pollen exposure, affecting outdoor exercise. He uses Allegra D when congested; advised to take daily plain Allegra for symptom management. Suggested neti pot use at night to reduce pollen exposure and improve morning symptoms. Consider referral to an allergy specialist if symptoms persist.  Vitamin D  deficiency   Vitamin D  level is at the lower limit of normal (33 ng/mL). Recommend vitamin D3 supplementation at 1000 IU daily to maintain adequate levels, emphasizing the importance of checking levels before supplementation due to its fat-soluble nature. I will see him back in a year or sooner if issues were to  arise.  Signed,  Knox Perl, MD, [facility] 10/28/2023, 6:52 AM [facility] [address] [place], [place] [address] Phone: [phone]. Fax:  [phone]

## 2023-12-01 ENCOUNTER — Ambulatory Visit (HOSPITAL_COMMUNITY)
Admission: RE | Admit: 2023-12-01 | Discharge: 2023-12-01 | Disposition: A | Discharge status: Home / Self Care | Source: Ambulatory Visit | Attending: Internal Medicine | Admitting: Internal Medicine

## 2023-12-01 DIAGNOSIS — I5022 Chronic systolic (congestive) heart failure: Secondary | ICD-10-CM | POA: Diagnosis not present

## 2023-12-01 DIAGNOSIS — I2510 Atherosclerotic heart disease of native coronary artery without angina pectoris: Secondary | ICD-10-CM | POA: Diagnosis present

## 2023-12-02 LAB — ECHOCARDIOGRAM COMPLETE
Area-P 1/2: 3.24 cm2
S' Lateral: 2.6 cm

## 2023-12-03 ENCOUNTER — Ambulatory Visit: Payer: Self-pay | Admitting: Cardiology

## 2023-12-03 NOTE — Progress Notes (Signed)
 Cardiac function has markedly improved to near normal indicating good prognosis long-term.

## 2024-01-17 ENCOUNTER — Telehealth: Payer: Self-pay | Admitting: Family Medicine

## 2024-01-17 NOTE — Telephone Encounter (Signed)
 Are you asking a question? If so no the labs was normal.

## 2024-01-17 NOTE — Telephone Encounter (Signed)
 Good morning are we repeating  the insulin  random and c-peptide for  this pt lab appointment

## 2024-01-18 ENCOUNTER — Encounter: Payer: Self-pay | Admitting: Cardiology

## 2024-01-18 NOTE — Telephone Encounter (Signed)
 He currently dose not have an appointment with us , he is wanting to be seen for his current symptoms.

## 2024-01-19 ENCOUNTER — Encounter: Payer: Self-pay | Admitting: Family Medicine

## 2024-01-19 ENCOUNTER — Ambulatory Visit: Admitting: Family Medicine

## 2024-01-19 ENCOUNTER — Ambulatory Visit: Payer: Self-pay

## 2024-01-19 VITALS — BP 110/74 | HR 93 | Temp 98.0°F | Resp 16 | Ht 69.0 in | Wt 160.0 lb

## 2024-01-19 DIAGNOSIS — N410 Acute prostatitis: Secondary | ICD-10-CM

## 2024-01-19 LAB — POC URINALSYSI DIPSTICK (AUTOMATED)
Bilirubin, UA: NEGATIVE
Glucose, UA: NEGATIVE
Ketones, UA: NEGATIVE
Nitrite, UA: NEGATIVE
Protein, UA: NEGATIVE
Spec Grav, UA: <=1.005 — AB (ref 1.010–1.025)
Urobilinogen, UA: 0.2 U/dL
pH, UA: 7 (ref 5.0–8.0)

## 2024-01-19 MED ORDER — SULFAMETHOXAZOLE-TRIMETHOPRIM 800-160 MG PO TABS: 1.0000 | ORAL_TABLET | Freq: Two times a day (BID) | ORAL | 0 refills | Status: AC

## 2024-01-19 NOTE — Progress Notes (Signed)
 Chief Complaint  Patient presents with   Abdominal Pain    Abdominal Pain and Fatigue, Urinary Retention    Michael Gibbs is a 58 y.o. male here for possible UTI.  Duration: 1 day. Symptoms: Dysuria, urinary frequency, urinary hesitancy, urinary retention, chills, and urgency Denies: hematuria, fever, nausea, flank pain and vomiting, dc He is not circumcised. Hx of recurrent UTI? No Denies new sexual partners.  Past Medical History:  Diagnosis Date   Allergic state 04/25/2007   Seasonal allergies-well controlled with Allegra and Flonase     CAD (coronary artery disease)    a. 12/2021: s/p STEMI with DES to proximal LAD   Dermatitis 09/22/2011   Headache(784.0) 09/22/2011   HFrEF (heart failure with reduced ejection fraction) (HCC)    a. EF 30-35% in 12/2021 in the setting of anterior STEMI   Hyperlipidemia, mixed 08/11/2014   Preventative health care 04/02/2011   Sleep apnea - mild no CPAP recommended      BP 110/74 (BP Location: Left Arm, Patient Position: Sitting)   Pulse 93   Temp 98 F (36.7 C) (Oral)   Resp 16   Ht 5' 9 (1.753 m)   Wt 160 lb (72.6 kg)   SpO2 96%   BMI 23.63 kg/m  General: Awake, alert, appears stated age Heart: RRR Lungs: CTAB, normal respiratory effort, no accessory muscle usage Abd: BS+, soft, NT, ND, no masses or organomegaly Rectal: Sphincter of good tone.  Prostate is TTP and slightly boggy MSK: No CVA tenderness, neg Lloyd's sign Psych: Age appropriate judgment and insight  Acute prostatitis - Plan: POCT Urinalysis Dipstick (Automated), Urine Culture, sulfamethoxazole -trimethoprim  (BACTRIM  DS) 800-160 MG tablet  2 weeks of Bactrim .  Check culture.  Stay hydrated. Seek immediate care if pt starts to develop fevers, new/worsening symptoms, uncontrollable N/V. F/u prn. The patient voiced understanding and agreement to the plan.  Mabel [place], DO 01/19/24 3:47 PM

## 2024-01-19 NOTE — Telephone Encounter (Signed)
 FYI Only or Action Required?: FYI only for provider.  Patient was last seen in primary care on 07/29/2022 by Almarie Waddell NOVAK, NP.  Called Nurse Triage reporting Urinary Retention.  Symptoms began yesterday.  Interventions attempted: Nothing.  Symptoms are: gradually worsening. Abdominal pain, concentrate urine, Only urinating a small amount, Fever  Triage Disposition: See HCP Within 4 Hours (Or PCP Triage)  Patient/caregiver understands and will follow disposition?: Yes                 Copied from CRM #[id number]. Topic: Clinical - Red Word Triage >> Jan 19, 2024 11:48 AM Suzen RAMAN wrote: Red Word that prompted transfer to Nurse Triage: stomach pain,inability to urinate, and chills Reason for Disposition  Fever > 100.4 F (38.0 C)  Answer Assessment - Initial Assessment Questions 1. SYMPTOM: What's the main symptom you're concerned about? (e.g., frequency, incontinence)     Frequency, Chills, BA 2. ONSET: When did the  s/s  start?     yesterday 3. PAIN: Is there any pain? If Yes, ask: How bad is it? (Scale: 1-10; mild, moderate, severe)     yes 4. CAUSE: What do you think is causing the symptoms?     unsure 5. OTHER SYMPTOMS: Do you have any other symptoms? (e.g., blood in urine, fever, flank pain, pain with urination)     Concentrated urine, abdominal pain  Protocols used: Urinary Symptoms-A-AH

## 2024-01-19 NOTE — Patient Instructions (Signed)
 Stay on a yogurt or probiotic.   Stay hydrated.  Let us  know if you need anything.

## 2024-01-21 LAB — URINE CULTURE
MICRO NUMBER:: 16741369
SPECIMEN QUALITY:: ADEQUATE

## 2024-01-22 ENCOUNTER — Ambulatory Visit: Payer: Self-pay | Admitting: Family Medicine

## 2024-01-23 ENCOUNTER — Other Ambulatory Visit: Payer: Self-pay | Admitting: Family Medicine

## 2024-01-23 MED ORDER — ONDANSETRON 4 MG PO TBDP: 4.0000 mg | ORAL_TABLET | Freq: Three times a day (TID) | ORAL | 0 refills | Status: DC | PRN

## 2024-01-24 NOTE — Telephone Encounter (Signed)
Patient saw PCP.

## 2024-01-25 NOTE — Assessment & Plan Note (Signed)
 Stable no recent exacerbations

## 2024-01-25 NOTE — Assessment & Plan Note (Signed)
 He suffered a STEMI in July of 2023 continues to work with cardiology and is doing well

## 2024-01-25 NOTE — Assessment & Plan Note (Signed)
 hgba1c acceptable, minimize simple carbs. Increase exercise as tolerated.

## 2024-01-25 NOTE — Assessment & Plan Note (Signed)
Encourage heart healthy diet such as MIND or DASH diet, increase exercise, avoid trans fats, simple carbohydrates and processed foods, consider a krill or fish or flaxseed oil cap daily. Developed myalgias and elevate Bili on statins.

## 2024-01-26 ENCOUNTER — Ambulatory Visit: Payer: No Typology Code available for payment source | Admitting: Family Medicine

## 2024-01-26 ENCOUNTER — Other Ambulatory Visit: Payer: No Typology Code available for payment source

## 2024-01-26 ENCOUNTER — Ambulatory Visit: Payer: Self-pay | Admitting: Family Medicine

## 2024-01-26 VITALS — BP 110/70 | HR 68 | Resp 16 | Ht 69.0 in | Wt 157.6 lb

## 2024-01-26 DIAGNOSIS — E785 Hyperlipidemia, unspecified: Secondary | ICD-10-CM | POA: Diagnosis not present

## 2024-01-26 DIAGNOSIS — R739 Hyperglycemia, unspecified: Secondary | ICD-10-CM

## 2024-01-26 DIAGNOSIS — I5042 Chronic combined systolic (congestive) and diastolic (congestive) heart failure: Secondary | ICD-10-CM | POA: Diagnosis not present

## 2024-01-26 DIAGNOSIS — I2510 Atherosclerotic heart disease of native coronary artery without angina pectoris: Secondary | ICD-10-CM

## 2024-01-26 DIAGNOSIS — Z1211 Encounter for screening for malignant neoplasm of colon: Secondary | ICD-10-CM

## 2024-01-26 LAB — CBC WITH DIFFERENTIAL/PLATELET
Basophils Absolute: 0.1 K/uL (ref 0.0–0.1)
Basophils Relative: 0.9 % (ref 0.0–3.0)
Eosinophils Absolute: 0.2 K/uL (ref 0.0–0.7)
Eosinophils Relative: 3 % (ref 0.0–5.0)
HCT: 42.9 % (ref 39.0–52.0)
Hemoglobin: 14.6 g/dL (ref 13.0–17.0)
Lymphocytes Relative: 37.7 % (ref 12.0–46.0)
Lymphs Abs: 2.5 K/uL (ref 0.7–4.0)
MCHC: 33.9 g/dL (ref 30.0–36.0)
MCV: 85.3 fl (ref 78.0–100.0)
Monocytes Absolute: 0.8 K/uL (ref 0.1–1.0)
Monocytes Relative: 12.2 % — ABNORMAL HIGH (ref 3.0–12.0)
Neutro Abs: 3 K/uL (ref 1.4–7.7)
Neutrophils Relative %: 46.2 % (ref 43.0–77.0)
Platelets: 309 K/uL (ref 150.0–400.0)
RBC: 5.03 Mil/uL (ref 4.22–5.81)
RDW: 13 % (ref 11.5–15.5)
WBC: 6.5 K/uL (ref 4.0–10.5)

## 2024-01-26 LAB — COMPREHENSIVE METABOLIC PANEL WITH GFR
ALT: 25 U/L (ref 0–53)
AST: 19 U/L (ref 0–37)
Albumin: 4.6 g/dL (ref 3.5–5.2)
Alkaline Phosphatase: 78 U/L (ref 39–117)
BUN: 12 mg/dL (ref 6–23)
CO2: 30 meq/L (ref 19–32)
Calcium: 9.6 mg/dL (ref 8.4–10.5)
Chloride: 100 meq/L (ref 96–112)
Creatinine, Ser: 1.12 mg/dL (ref 0.40–1.50)
GFR: 72.12 mL/min (ref >60.00)
Glucose, Bld: 84 mg/dL (ref 70–99)
Potassium: 4.9 meq/L (ref 3.5–5.1)
Sodium: 136 meq/L (ref 135–145)
Total Bilirubin: 1.3 mg/dL — ABNORMAL HIGH (ref 0.2–1.2)
Total Protein: 7.6 g/dL (ref 6.0–8.3)

## 2024-01-26 LAB — LIPID PANEL
Cholesterol: 75 mg/dL (ref 0–200)
HDL: 30.9 mg/dL — ABNORMAL LOW (ref >39.00)
LDL Cholesterol: 23 mg/dL (ref 0–99)
NonHDL: 44.02
Total CHOL/HDL Ratio: 2
Triglycerides: 107 mg/dL (ref 0.0–149.0)
VLDL: 21.4 mg/dL (ref 0.0–40.0)

## 2024-01-26 LAB — TSH: TSH: 3.37 u[IU]/mL (ref 0.35–5.50)

## 2024-01-26 LAB — HEMOGLOBIN A1C: Hgb A1c MFr Bld: 5.6 % (ref 4.6–6.5)

## 2024-01-26 NOTE — Patient Instructions (Addendum)
 NOW company supplements  Multi strain probiotics and various supplements at [employer]  Ginger  Pepcid as needed, Tums, Mylanta

## 2024-01-26 NOTE — Progress Notes (Signed)
 Subjective:    [name], male    DOB: [birth year], 58 y.o.   MRN: [id number]  Chief Complaint  Patient presents with   Medical Management of Chronic Issues    Patient presents today for a 9 month follow-up.   Quality Metric Gaps    Cologuard, Hep B, pneumococcal vaccine    HPI Discussed the use of AI scribe software for clinical note transcription with the patient, who gave verbal consent to proceed.  History of Present Illness Michael Gibbs is a 58 year old male who presents with E. coli infection and associated symptoms.  He has been experiencing severe diarrhea, abdominal pain, and nausea. The initial five days were particularly severe, with constant headaches and frequent trips to the bathroom. He is currently on antibiotics, which have alleviated some symptoms, but he still experiences nausea and a persistent smell associated with the medication.  He has been taking Activia yogurt as a probiotic, though he acknowledges it may not be sufficient due to its single strain. He has not been out much, primarily staying at work, and is unsure of how he contracted the infection. He notes that the heat may have contributed to the bacteria's growth and spread.  Stress at work is a significant factor in his life, contributing to his overall well-being. He describes the work environment as chaotic, with increased responsibilities and uncertainty about the future, which adds to his stress levels. Denies CP/palp/SOB/HA/congestion/fevers or GU c/o. Taking meds as prescribed     Past Medical History:  Diagnosis Date   Allergic state 04/25/2007   Seasonal allergies-well controlled with Allegra and Flonase     CAD (coronary artery disease)    a. 12/2021: s/p STEMI with DES to proximal LAD   Dermatitis 09/22/2011   Headache(784.0) 09/22/2011   HFrEF (heart failure with reduced ejection fraction) (HCC)    a. EF 30-35% in 12/2021 in the setting of  anterior STEMI   Hyperlipidemia, mixed 08/11/2014   Preventative health care 04/02/2011   Sleep apnea - mild no CPAP recommended     Past Surgical History:  Procedure Laterality Date   CARDIAC CATHETERIZATION     CORONARY/GRAFT ACUTE MI REVASCULARIZATION N/A 01/20/2022   Procedure: Coronary/Graft Acute MI Revascularization;  Surgeon: Swaziland, Peter M, MD;  Location[facility] INVASIVE CV LAB;  Service: Cardiovascular;  Laterality: N/A;   LEFT HEART CATH AND CORONARY ANGIOGRAPHY N/A 01/20/2022   Procedure: LEFT HEART CATH AND CORONARY ANGIOGRAPHY;  Surgeon: Swaziland, Peter M, MD;  Location: [facility] INVASIVE CV LAB;  Service: Cardiovascular;  Laterality: N/A;    Family History  Problem Relation Age of Onset   Heart disease Mother    Diabetes Mother        diet controlled DM   Other Father        bowel obstruction with resection, hyponatremia   Ulcers Father    Depression Brother    Cancer Paternal Grandmother        esophageal cancer   GER disease Paternal Grandmother     Social History   Socioeconomic History   Marital status: Married    Spouse name: Not on file   Number of children: 2   Years of education: 16   Highest education level: Master's degree (e.g., MA, MS, MEng, MEd, MSW, MBA)  Occupational History   Not on file  Tobacco Use   Smoking status: Never   Smokeless tobacco: Never  Vaping Use   Vaping status: Never Used  Substance and Sexual Activity   Alcohol use: Yes    Comment: rare beer   Drug use: No   Sexual activity: Yes    Comment: lives with wife,  works in IT, vegetarian, wears a seat baelt  Other Topics Concern   Not on file  Social History Narrative   Patient is from [place] but lives in [place], has been in the [place]  since 1996, lives with wife and 2 children-ages 9 and 7, wife works from home, her office is in [place] . Remainder family lives in [place]. Patient works in Consulting civil engineer. Enjoys tennis. Vegetarian.   Social Drivers of Research scientist (physical sciences) Strain: Low Risk  (01/26/2024)   Overall Financial Resource Strain (CARDIA)    Difficulty of Paying Living Expenses: Not hard at all  Food Insecurity: No Food Insecurity (01/26/2024)   Hunger Vital Sign    Worried About Running Out of Food in the Last Year: Never true    Ran Out of Food in the Last Year: Never true  Transportation Needs: No Transportation Needs (01/26/2024)   PRAPARE - Administrator, Civil Service (Medical): No    Lack of Transportation (Non-Medical): No  Physical Activity: Insufficiently Active (01/26/2024)   Exercise Vital Sign    Days of Exercise per Week: 1 day    Minutes of Exercise per Session: 100 min  Stress: Stress Concern Present (01/26/2024)   [employer] of Occupational Health - Occupational Stress Questionnaire    Feeling of Stress: To some extent  Social Connections: Moderately Integrated (01/26/2024)   Social Connection and Isolation Panel    Frequency of Communication with Friends and Family: Twice a week    Frequency of Social Gatherings with Friends and Family: Once a week    Attends Religious Services: 1 to 4 times per year    Active Member of [employer] or Organizations: No    Attends Engineer, structural: Not on file    Marital Status: Married  Catering manager Violence: Not on file    Outpatient Medications Prior to Visit  Medication Sig Dispense Refill   aspirin  (ASPIRIN  CHILDRENS) 81 MG chewable tablet Chew 1 tablet (81 mg total) by mouth daily.     carvedilol  (COREG ) 3.125 MG tablet Take 1 tablet (3.125 mg total) by mouth 2 (two) times daily with a meal. 180 tablet 2   Evolocumab  (REPATHA  SURECLICK) 140 MG/ML SOAJ INJECT 140 MG INTO THE SKIN EVERY 14 DAYS 2 mL 11   nitroGLYCERIN  (NITROSTAT ) 0.4 MG SL tablet Place 1 tablet (0.4 mg total) under the tongue every 5 (five) minutes x 3 doses as needed for chest pain. 25 tablet 2   sacubitril-valsartan (ENTRESTO ) 24-26 MG Take 1 tablet by mouth 2 (two) times daily. 180  tablet 2   sulfamethoxazole -trimethoprim  (BACTRIM  DS) 800-160 MG tablet Take 1 tablet by mouth 2 (two) times daily for 14 days. 28 tablet 0   triamcinolone  cream (KENALOG ) 0.1 % APPLY  CREAM EXTERNALLY TO AFFECTED AREA TWICE DAILY 30 g 0   ondansetron  (ZOFRAN -ODT) 4 MG disintegrating tablet Take 1 tablet (4 mg total) by mouth every 8 (eight) hours as needed for nausea or vomiting. 20 tablet 0   No facility-administered medications prior to visit.    Allergies  Allergen Reactions   Lipitor  [Atorvastatin ] Other (See Comments)    aches    Review of Systems  Constitutional:  Negative for fever and malaise/fatigue.  HENT:  Negative for congestion.   Eyes:  Negative  for blurred vision.  Respiratory:  Negative for shortness of breath.   Cardiovascular:  Negative for chest pain, palpitations and leg swelling.  Gastrointestinal:  Positive for abdominal pain. Negative for blood in stool, constipation, diarrhea, melena and nausea.  Genitourinary:  Negative for dysuria and frequency.  Musculoskeletal:  Negative for falls.  Skin:  Negative for rash.  Neurological:  Negative for dizziness, loss of consciousness and headaches.  Endo/Heme/Allergies:  Negative for environmental allergies.  Psychiatric/Behavioral:  Negative for depression. The patient is not nervous/anxious.        Objective:    Physical Exam Vitals reviewed.  Constitutional:      Appearance: Normal appearance. He is not ill-appearing.  HENT:     Head: Normocephalic and atraumatic.     Nose: Nose normal.  Eyes:     Conjunctiva/sclera: Conjunctivae normal.  Cardiovascular:     Rate and Rhythm: Normal rate.     Pulses: Normal pulses.     Heart sounds: Normal heart sounds. No murmur heard. Pulmonary:     Effort: Pulmonary effort is normal.     Breath sounds: Normal breath sounds. No wheezing.  Abdominal:     Palpations: Abdomen is soft. There is no mass.     Tenderness: There is no abdominal tenderness.   Musculoskeletal:     Cervical back: Normal range of motion.     Right lower leg: No edema.     Left lower leg: No edema.  Skin:    General: Skin is warm and dry.  Neurological:     General: No focal deficit present.     Mental Status: He is alert and oriented to person, place, and time.  Psychiatric:        Mood and Affect: Mood normal.     BP 110/70   Pulse 68   Resp 16   Ht 5' 9 (1.753 m)   Wt 157 lb 9.6 oz (71.5 kg)   SpO2 98%   BMI 23.27 kg/m  Wt Readings from Last 3 Encounters:  01/26/24 157 lb 9.6 oz (71.5 kg)  01/19/24 160 lb (72.6 kg)  10/27/23 164 lb (74.4 kg)    Diabetic Foot Exam - Simple   No data filed    Lab Results  Component Value Date   WBC 6.5 07/29/2022   HGB 15.2 07/29/2022   HCT 44.5 07/29/2022   PLT 267.0 07/29/2022   GLUCOSE 87 07/29/2022   CHOL 90 (L) 10/08/2022   TRIG 81 10/08/2022   HDL 49 10/08/2022   LDLDIRECT 110 (H) 04/25/2007   LDLCALC 24 10/08/2022   ALT 11 10/08/2022   AST 13 10/08/2022   NA 138 07/29/2022   K 4.9 08/05/2022   CL 102 07/29/2022   CREATININE 0.87 07/29/2022   BUN 11 07/29/2022   CO2 32 07/29/2022   TSH 1.993 01/21/2022   PSA 0.58 05/14/2022   INR 1.1 01/20/2022   HGBA1C 5.3 11/17/2022    Lab Results  Component Value Date   TSH 1.993 01/21/2022   Lab Results  Component Value Date   WBC 6.5 07/29/2022   HGB 15.2 07/29/2022   HCT 44.5 07/29/2022   MCV 85.8 07/29/2022   PLT 267.0 07/29/2022   Lab Results  Component Value Date   NA 138 07/29/2022   K 4.9 08/05/2022   CO2 32 07/29/2022   GLUCOSE 87 07/29/2022   BUN 11 07/29/2022   CREATININE 0.87 07/29/2022   BILITOT 2.1 (H) 10/08/2022   ALKPHOS 95 10/08/2022  AST 13 10/08/2022   ALT 11 10/08/2022   PROT 6.9 10/08/2022   ALBUMIN 4.5 10/08/2022   CALCIUM  9.4 07/29/2022   ANIONGAP 9 04/12/2022   EGFR 100 05/28/2022   GFR 95.72 07/29/2022   Lab Results  Component Value Date   CHOL 90 (L) 10/08/2022   Lab Results  Component Value  Date   HDL 49 10/08/2022   Lab Results  Component Value Date   LDLCALC 24 10/08/2022   Lab Results  Component Value Date   TRIG 81 10/08/2022   Lab Results  Component Value Date   CHOLHDL 1.8 10/08/2022   Lab Results  Component Value Date   HGBA1C 5.3 11/17/2022       Assessment & Plan:  Coronary artery disease involving native coronary artery of native heart, unspecified whether angina present Assessment & Plan: He suffered a STEMI in July of 2023 continues to work with cardiology and is doing well   Chronic combined systolic (congestive) and diastolic (congestive) heart failure (HCC) Assessment & Plan: Stable no recent exacerbations   Hyperlipidemia, unspecified hyperlipidemia type Assessment & Plan: Encourage heart healthy diet such as MIND or DASH diet, increase exercise, avoid trans fats, simple carbohydrates and processed foods, consider a krill or fish or flaxseed oil cap daily. Developed myalgias and elevate Bili on statins.   Orders: -     Lipid panel -     TSH  Hyperglycemia Assessment & Plan: hgba1c acceptable, minimize simple carbs. Increase exercise as tolerated.   Orders: -     Hemoglobin A1c -     Comprehensive metabolic panel with GFR -     CBC with Differential/Platelet  Colon cancer screening -     Ambulatory referral to Gastroenterology    Assessment and Plan Assessment & Plan E. coli bacterial intestinal infection Symptoms improved with antibiotics; nausea likely due to medication. - Recommend multistrain probiotic for one month. - Advise ginger for nausea relief. - Consider Pepcid for heartburn if needed. - Use Tums or Mylanta for immediate acid relief if necessary.  Heart failure with improved ejection fraction Ejection fraction improved per recent echocardiogram. - Continue annual cardiology follow-up.  Work-related stress Significant stress from workload and workplace uncertainty. - Consider medical intervention if stress  becomes unmanageable.     Harlene Horton, MD

## 2024-03-14 ENCOUNTER — Encounter: Payer: Self-pay | Admitting: Physician Assistant

## 2024-05-07 NOTE — Progress Notes (Unsigned)
 05/08/2024 Michael Gibbs [id number] 16-Apr-1966  Referring provider: Domenica Harlene LABOR, MD Primary GI doctor: Dr. San  ASSESSMENT AND PLAN:  CAD  Status post DES 2023 proximal LAD On bASA, not on a blood thinner at this time Denies chest pain/SOB  HFpEF (improved) 11/2023 echo finding suspicious for LV apical thrombus recommend echo with density contrast, EF 45 to 50% grade 2 diastolic dysfunction no aortic stenosis mild MR No swelling in legs/SOB EF has improved however findings suspicious for LV thrombus, will get cardiac clearance from Dr. Ladona to proceed with colonoscopy but should be appropriate  CCS Has never had a colonoscopy No family history of colon cancer No symptoms Schedule colonoscopy at [facility], We have discussed the risks of bleeding, infection, perforation, medication reactions, and remote risk of death associated with colonoscopy. All questions were answered and the patient acknowledges these risk and wishes to proceed.  [name] 2023 AB US  unremarkable predominantly indirect hyperbilirubinemia stable for years  Patient Care Team: Domenica Harlene LABOR, MD as PCP - General (Family Medicine) Ladona Heinz, MD as PCP - Cardiology (Cardiology)  HISTORY OF PRESENT ILLNESS: 58 y.o. male with a past medical history listed below presents for evaluation of colonoscopy.   Transfer of care from Dr. Therisa, last seen there 11/2022  Discussed the use of AI scribe software for clinical note transcription with the patient, who gave verbal consent to proceed.  History of Present Illness   Michael Gibbs is a 58 year old male who presents for a screening colonoscopy.  He is undergoing his first colonoscopy and has no family history of colon cancer. No changes in bowel habits, abdominal pain, diarrhea, constipation, dark stools, or rectal bleeding. No upper gastrointestinal symptoms such as dysphagia, heartburn, nausea, or vomiting. He has  experienced a recent sore throat, which he attributes to allergies or dryness.  He has a history of heart issues. He is not on blood thinners but takes baby aspirin . No chest pain, shortness of breath, or leg swelling. The patient reports that an echocardiogram in June showed improved ejection fraction, and he received a note stating that his heart function had improved to a great extent and that there was no concern about anything. He has not required a pacemaker.  He denies alcohol and smoking use. He used to take Aleve or ibuprofen for headaches but no longer does.      He  reports that he has never smoked. He has never used smokeless tobacco. He reports current alcohol use. He reports that he does not use drugs.  RELEVANT GI HISTORY, IMAGING AND LABS: Results   DIAGNOSTIC Echocardiogram: Ejection fraction improved, questionable apical thrombus, left ventricular thrombus (11/2022)      CBC    Component Value Date/Time   WBC 6.5 01/26/2024 0952   RBC 5.03 01/26/2024 0952   HGB 14.6 01/26/2024 0952   HGB 14.8 05/28/2022 0838   HCT 42.9 01/26/2024 0952   HCT 44.4 05/28/2022 0838   PLT 309.0 01/26/2024 0952   PLT 260 05/28/2022 0838   MCV 85.3 01/26/2024 0952   MCV 86 05/28/2022 0838   MCH 28.8 05/28/2022 0838   MCH 29.0 04/12/2022 1012   MCHC 33.9 01/26/2024 0952   RDW 13.0 01/26/2024 0952   RDW 12.6 05/28/2022 0838   LYMPHSABS 2.5 01/26/2024 0952   LYMPHSABS 2.5 05/28/2022 0838   MONOABS 0.8 01/26/2024 0952   EOSABS 0.2 01/26/2024 0952   EOSABS 0.2 05/28/2022 0838   BASOSABS 0.1  01/26/2024 0952   BASOSABS 0.1 05/28/2022 0838   Recent Labs    01/26/24 0952  HGB 14.6    CMP     Component Value Date/Time   NA 136 01/26/2024 0952   NA 140 05/28/2022 0838   K 4.9 01/26/2024 0952   CL 100 01/26/2024 0952   CO2 30 01/26/2024 0952   GLUCOSE 84 01/26/2024 0952   BUN 12 01/26/2024 0952   BUN 10 05/28/2022 0838   CREATININE 1.12 01/26/2024 0952   CREATININE 0.88  04/15/2020 1155   CALCIUM  9.6 01/26/2024 0952   PROT 7.6 01/26/2024 0952   PROT 6.9 10/08/2022 0923   ALBUMIN 4.6 01/26/2024 0952   ALBUMIN 4.5 10/08/2022 0923   AST 19 01/26/2024 0952   ALT 25 01/26/2024 0952   ALKPHOS 78 01/26/2024 0952   BILITOT 1.3 (H) 01/26/2024 0952   BILITOT 2.1 (H) 10/08/2022 0923   GFRNONAA >60 04/12/2022 1012      Latest Ref Rng & Units 01/26/2024    9:52 AM 10/08/2022    9:23 AM 07/29/2022    9:58 AM  Hepatic Function  Total Protein 6.0 - 8.3 g/dL 7.6  6.9  7.1   Albumin 3.5 - 5.2 g/dL 4.6  4.5  4.6   AST 0 - 37 U/L 19  13  13    ALT 0 - 53 U/L 25  11  12    Alk Phosphatase 39 - 117 U/L 78  95  85   Total Bilirubin 0.2 - 1.2 mg/dL 1.3  2.1  1.8   Bilirubin, Direct 0.00 - 0.40 mg/dL  9.85        Current Medications:    Current Outpatient Medications (Cardiovascular):    carvedilol  (COREG ) 3.125 MG tablet, Take 1 tablet (3.125 mg total) by mouth 2 (two) times daily with a meal.   Evolocumab  (REPATHA  SURECLICK) 140 MG/ML SOAJ, INJECT 140 MG INTO THE SKIN EVERY 14 DAYS   nitroGLYCERIN  (NITROSTAT ) 0.4 MG SL tablet, Place 1 tablet (0.4 mg total) under the tongue every 5 (five) minutes x 3 doses as needed for chest pain.   sacubitril-valsartan (ENTRESTO ) 24-26 MG, Take 1 tablet by mouth 2 (two) times daily.   Current Outpatient Medications (Analgesics):    aspirin  (ASPIRIN  CHILDRENS) 81 MG chewable tablet, Chew 1 tablet (81 mg total) by mouth daily.   Current Outpatient Medications (Other):    triamcinolone  cream (KENALOG ) 0.1 %, APPLY  CREAM EXTERNALLY TO AFFECTED AREA TWICE DAILY  Medical History:  Past Medical History:  Diagnosis Date   Allergic state 04/25/2007   Seasonal allergies-well controlled with Allegra and Flonase     CAD (coronary artery disease)    a. 12/2021: s/p STEMI with DES to proximal LAD   Dermatitis 09/22/2011   Headache(784.0) 09/22/2011   HFrEF (heart failure with reduced ejection fraction) (HCC)    a. EF 30-35% in 12/2021  in the setting of anterior STEMI   Hyperlipidemia, mixed 08/11/2014   Preventative health care 04/02/2011   Sleep apnea - mild no CPAP recommended    Allergies:  Allergies  Allergen Reactions   Lipitor  [Atorvastatin ] Other (See Comments)    aches     Surgical History:  He  has a past surgical history that includes Coronary/Graft Acute MI Revascularization (N/A, 01/20/2022); LEFT HEART CATH AND CORONARY ANGIOGRAPHY (N/A, 01/20/2022); and Cardiac catheterization. Family History:  His family history includes Cancer in his paternal grandmother; Depression in his brother; Diabetes in his mother; GER disease in his paternal grandmother; Heart  disease in his mother; Other in his father; Ulcers in his father.  REVIEW OF SYSTEMS  : All other systems reviewed and negative except where noted in the History of Present Illness.  PHYSICAL EXAM: BP 100/60   Pulse (!) 58   Ht 5' 9 (1.753 m)   Wt 168 lb (76.2 kg)   BMI 24.81 kg/m  Physical Exam   GENERAL APPEARANCE: Well nourished, in no apparent distress HEENT: No cervical lymphadenopathy, unremarkable thyroid , sclerae anicteric, conjunctiva pink RESPIRATORY: Respiratory effort normal, BS equal bilateral without rales, rhonchi, wheezing CARDIO: RRR with no MRGs, peripheral pulses intact ABDOMEN: Soft, non distended, active bowel sounds in all 4 quadrants, no tenderness to palpation, no rebound, no mass appreciated RECTAL: declines MUSCULOSKELETAL: Full ROM, normal gait, without edema SKIN: Dry, intact without rashes or lesions. No jaundice. NEURO: Alert, oriented, no focal deficits PSYCH: Cooperative, normal mood and affect.      Alan JONELLE Coombs, PA-C 11:34 AM

## 2024-05-08 ENCOUNTER — Encounter: Payer: Self-pay | Admitting: Physician Assistant

## 2024-05-08 ENCOUNTER — Other Ambulatory Visit: Payer: Self-pay | Admitting: Cardiology

## 2024-05-08 ENCOUNTER — Ambulatory Visit: Admitting: Physician Assistant

## 2024-05-08 DIAGNOSIS — K625 Hemorrhage of anus and rectum: Secondary | ICD-10-CM | POA: Diagnosis not present

## 2024-05-08 DIAGNOSIS — I5020 Unspecified systolic (congestive) heart failure: Secondary | ICD-10-CM

## 2024-05-08 DIAGNOSIS — E785 Hyperlipidemia, unspecified: Secondary | ICD-10-CM

## 2024-05-08 DIAGNOSIS — I2510 Atherosclerotic heart disease of native coronary artery without angina pectoris: Secondary | ICD-10-CM

## 2024-05-08 DIAGNOSIS — I2102 ST elevation (STEMI) myocardial infarction involving left anterior descending coronary artery: Secondary | ICD-10-CM

## 2024-05-08 MED ORDER — NA SULFATE-K SULFATE-MG SULF 17.5-3.13-1.6 GM/177ML PO SOLN: 1.0000 | Freq: Once | ORAL | 0 refills | Status: AC

## 2024-05-08 NOTE — Patient Instructions (Signed)
 You have been scheduled for a colonoscopy. Please follow written instructions given to you at your visit today.   If you use inhalers (even only as needed), please bring them with you on the day of your procedure.  DO NOT TAKE 7 DAYS PRIOR TO TEST- Trulicity (dulaglutide) Ozempic, Wegovy (semaglutide) Mounjaro, Zepbound (tirzepatide) Bydureon Bcise (exanatide extended release)  DO NOT TAKE 1 DAY PRIOR TO YOUR TEST Rybelsus (semaglutide) Adlyxin (lixisenatide) Victoza (liraglutide) Byetta (exanatide) ___________________________________________________________________________   Thank you for trusting me with your gastrointestinal care!   Alan Coombs, PA-C  _______________________________________________________  If your blood pressure at your visit was 140/90 or greater, please contact your primary care physician to follow up on this.  _______________________________________________________  If you are age 9 or older, your body mass index should be between 23-30. Your Body mass index is 24.81 kg/m. If this is out of the aforementioned range listed, please consider follow up with your Primary Care Provider.  If you are age 93 or younger, your body mass index should be between 19-25. Your Body mass index is 24.81 kg/m. If this is out of the aformentioned range listed, please consider follow up with your Primary Care Provider.   ________________________________________________________  The [facility] providers would like to encourage you to use MYCHART to communicate with providers for non-urgent requests or questions.  Due to long hold times on the telephone, sending your provider a message by [facility] may be a faster and more efficient way to get a response.  Please allow 48 business hours for a response.  Please remember that this is for non-urgent requests.  _______________________________________________________  [facility] is using a team-based approach to  care.  Your team is made up of your doctor and two to three APPS. Our APPS (Nurse Practitioners and Physician Assistants) work with your physician to ensure care continuity for you. They are fully qualified to address your health concerns and develop a treatment plan. They communicate directly with your gastroenterologist to care for you. Seeing the Advanced Practice Practitioners on your physician's team can help you by facilitating care more promptly, often allowing for earlier appointments, access to diagnostic testing, procedures, and other specialty referrals.

## 2024-06-11 ENCOUNTER — Other Ambulatory Visit: Payer: Self-pay | Admitting: Cardiology

## 2024-06-11 ENCOUNTER — Encounter: Payer: Self-pay | Admitting: Cardiology

## 2024-06-11 DIAGNOSIS — I5022 Chronic systolic (congestive) heart failure: Secondary | ICD-10-CM

## 2024-06-12 MED ORDER — SACUBITRIL-VALSARTAN 24-26 MG PO TABS: 1.0000 | ORAL_TABLET | Freq: Two times a day (BID) | ORAL | 1 refills | Status: AC

## 2024-06-15 ENCOUNTER — Ambulatory Visit (AMBULATORY_SURGERY_CENTER): Admitting: Gastroenterology

## 2024-06-15 ENCOUNTER — Encounter: Payer: Self-pay | Admitting: Gastroenterology

## 2024-06-15 VITALS — BP 102/56 | HR 85 | Temp 98.2°F | Resp 12 | Ht 69.0 in | Wt 168.0 lb

## 2024-06-15 DIAGNOSIS — K648 Other hemorrhoids: Secondary | ICD-10-CM | POA: Diagnosis not present

## 2024-06-15 DIAGNOSIS — K6289 Other specified diseases of anus and rectum: Secondary | ICD-10-CM | POA: Diagnosis not present

## 2024-06-15 DIAGNOSIS — Z1211 Encounter for screening for malignant neoplasm of colon: Secondary | ICD-10-CM | POA: Diagnosis present

## 2024-06-15 DIAGNOSIS — K640 First degree hemorrhoids: Secondary | ICD-10-CM

## 2024-06-15 DIAGNOSIS — K635 Polyp of colon: Secondary | ICD-10-CM | POA: Diagnosis not present

## 2024-06-15 DIAGNOSIS — D122 Benign neoplasm of ascending colon: Secondary | ICD-10-CM

## 2024-06-15 MED ORDER — SODIUM CHLORIDE 0.9 % IV SOLN: 500.0000 mL | Freq: Once | INTRAVENOUS | Status: DC

## 2024-06-15 NOTE — Progress Notes (Signed)
 Vss nad trans to pacu

## 2024-06-15 NOTE — Patient Instructions (Addendum)
 Resume previous diet Continue present medications Await pathology results Return to GI office as needed  Handouts/information given for polyps, hemorrhoid banding and hemorrhoids  YOU HAD AN ENDOSCOPIC PROCEDURE TODAY AT THE [facility]:   Refer to the procedure report that was given to you for any specific questions about what was found during the examination.  If the procedure report does not answer your questions, please call your gastroenterologist to clarify.  If you requested that your care partner not be given the details of your procedure findings, then the procedure report has been included in a sealed envelope for you to review at your convenience later.  YOU SHOULD EXPECT: Some feelings of bloating in the abdomen. Passage of more gas than usual.  Walking can help get rid of the air that was put into your GI tract during the procedure and reduce the bloating. If you had a lower endoscopy (such as a colonoscopy or flexible sigmoidoscopy) you may notice spotting of blood in your stool or on the toilet paper. If you underwent a bowel prep for your procedure, you may not have a normal bowel movement for a few days.  Please Note:  You might notice some irritation and congestion in your nose or some drainage.  This is from the oxygen used during your procedure.  There is no need for concern and it should clear up in a day or so.  SYMPTOMS TO REPORT IMMEDIATELY:  Following lower endoscopy (colonoscopy or flexible sigmoidoscopy):  Excessive amounts of blood in the stool  Significant tenderness or worsening of abdominal pains  Swelling of the abdomen that is new, acute  Fever of 100F or higher For urgent or emergent issues, a gastroenterologist can be reached at any hour by calling [phone]. Do not use MyChart messaging for urgent concerns.    DIET:  We do recommend a small meal at first, but then you may proceed to your regular diet.  Drink plenty of fluids but you  should avoid alcoholic beverages for 24 hours.  ACTIVITY:  You should plan to take it easy for the rest of today and you should NOT DRIVE or use heavy machinery until tomorrow (because of the sedation medicines used during the test).    FOLLOW UP: Our staff will call the number listed on your records the next business day following your procedure.  We will call around 7:15- 8:00 am to check on you and address any questions or concerns that you may have regarding the information given to you following your procedure. If we do not reach you, we will leave a message.     If any biopsies were taken you will be contacted by phone or by letter within the next 1-3 weeks.  Please call us  at [phone] if you have not heard about the biopsies in 3 weeks.    SIGNATURES/CONFIDENTIALITY: You and/or your care partner have signed paperwork which will be entered into your electronic medical record.  These signatures attest to the fact that that the information above on your After Visit Summary has been reviewed and is understood.  Full responsibility of the confidentiality of this discharge information lies with you and/or your care-partner.

## 2024-06-15 NOTE — Progress Notes (Signed)
 "   GASTROENTEROLOGY PROCEDURE H&P NOTE   Primary Care Physician: Domenica Harlene LABOR, MD    Reason for Procedure:  Colon Cancer screening  Plan:    Colonoscopy  Patient is appropriate for endoscopic procedure(s) in the ambulatory ([facility]) setting.  The nature of the procedure, as well as the risks, benefits, and alternatives were carefully and thoroughly reviewed with the patient. Ample time for discussion and questions allowed. The patient understood, was satisfied, and agreed to proceed. I personally addressed all patient questions and concerns.     HPI: Michael Gibbs is a 58 y.o. male who presents for colonoscopy for routine Colon Cancer screening.  No active GI symptoms.  No known family history of colon cancer or related malignancy.  Patient is otherwise without complaints or active issues today.  Past Medical History:  Diagnosis Date   Allergic state 04/25/2007   Seasonal allergies-well controlled with Allegra and Flonase     CAD (coronary artery disease)    a. 12/2021: s/p STEMI with DES to proximal LAD   Dermatitis 09/22/2011   Headache(784.0) 09/22/2011   HFrEF (heart failure with reduced ejection fraction) (HCC)    a. EF 30-35% in 12/2021 in the setting of anterior STEMI   Hyperlipidemia, mixed 08/11/2014   Preventative health care 04/02/2011   Sleep apnea - mild no CPAP recommended     Past Surgical History:  Procedure Laterality Date   CARDIAC CATHETERIZATION     CORONARY/GRAFT ACUTE MI REVASCULARIZATION N/A 01/20/2022   Procedure: Coronary/Graft Acute MI Revascularization;  Surgeon: Jordan, Peter M, MD;  Location: [facility] INVASIVE CV LAB;  Service: Cardiovascular;  Laterality: N/A;   LEFT HEART CATH AND CORONARY ANGIOGRAPHY N/A 01/20/2022   Procedure: LEFT HEART CATH AND CORONARY ANGIOGRAPHY;  Surgeon: Jordan, Peter M, MD;  Location: [facility] INVASIVE CV LAB;  Service: Cardiovascular;  Laterality: N/A;    Prior to Admission medications  Medication Sig Start Date  End Date Taking? Authorizing Provider  aspirin  (ASPIRIN  CHILDRENS) 81 MG chewable tablet Chew 1 tablet (81 mg total) by mouth daily. 12/13/22  Yes Ladona Heinz, MD  carvedilol  (COREG ) 3.125 MG tablet Take 1 tablet (3.125 mg total) by mouth 2 (two) times daily with a meal. 07/11/23  Yes Ladona Heinz, MD  sacubitril -valsartan  (ENTRESTO ) 24-26 MG Take 1 tablet by mouth 2 (two) times daily. 06/12/24  Yes Ladona Heinz, MD  triamcinolone  cream (KENALOG ) 0.1 % APPLY  CREAM EXTERNALLY TO AFFECTED AREA TWICE DAILY 05/02/23  Yes Webb, Padonda B, FNP  Evolocumab  (REPATHA  SURECLICK) 140 MG/ML SOAJ INJECT 140 MG INTO THE SKIN EVERY 14 DAYS 05/08/24   Ladona Heinz, MD  nitroGLYCERIN  (NITROSTAT ) 0.4 MG SL tablet Place 1 tablet (0.4 mg total) under the tongue every 5 (five) minutes x 3 doses as needed for chest pain. 01/25/23   Jordan, Peter M, MD    Current Outpatient Medications  Medication Sig Dispense Refill   aspirin  (ASPIRIN  CHILDRENS) 81 MG chewable tablet Chew 1 tablet (81 mg total) by mouth daily.     carvedilol  (COREG ) 3.125 MG tablet Take 1 tablet (3.125 mg total) by mouth 2 (two) times daily with a meal. 180 tablet 2   sacubitril -valsartan  (ENTRESTO ) 24-26 MG Take 1 tablet by mouth 2 (two) times daily. 180 tablet 1   triamcinolone  cream (KENALOG ) 0.1 % APPLY  CREAM EXTERNALLY TO AFFECTED AREA TWICE DAILY 30 g 0   Evolocumab  (REPATHA  SURECLICK) 140 MG/ML SOAJ INJECT 140 MG INTO THE SKIN EVERY 14 DAYS 6 mL 1  nitroGLYCERIN  (NITROSTAT ) 0.4 MG SL tablet Place 1 tablet (0.4 mg total) under the tongue every 5 (five) minutes x 3 doses as needed for chest pain. 25 tablet 2   Current Facility-Administered Medications  Medication Dose Route Frequency Provider Last Rate Last Admin   0.9 %  sodium chloride  infusion  500 mL Intravenous Once Ronella Plunk V, DO        Allergies as of 06/15/2024 - Review Complete 06/15/2024  Allergen Reaction Noted   Lipitor  [atorvastatin ] Other (See Comments) 04/22/2022    Family  History  Problem Relation Age of Onset   Heart disease Mother    Diabetes Mother        diet controlled DM   Other Father        bowel obstruction with resection, hyponatremia   Ulcers Father    Depression Brother    Cancer Paternal Grandmother        esophageal cancer   GER disease Paternal Grandmother     Social History   Socioeconomic History   Marital status: Married    Spouse name: Not on file   Number of children: 2   Years of education: 16   Highest education level: Master's degree (e.g., MA, MS, MEng, MEd, MSW, MBA)  Occupational History   Not on file  Tobacco Use   Smoking status: Never   Smokeless tobacco: Never  Vaping Use   Vaping status: Never Used  Substance and Sexual Activity   Alcohol use: Yes    Comment: rare beer   Drug use: No   Sexual activity: Yes    Comment: lives with wife,  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 Remainder family lives in [place]. 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 Enjoys tennis. Vegetarian.   Social Drivers of Health   Tobacco Use: Low Risk (06/15/2024)   Patient History    Smoking Tobacco Use: Never    Smokeless Tobacco Use: Never    Passive Exposure: Not on file  Financial Resource Strain: Low Risk (01/26/2024)   Overall Financial Resource Strain (CARDIA)    Difficulty of Paying Living Expenses: Not hard at all  Food Insecurity: No Food Insecurity (01/26/2024)   Epic    Worried About Programme Researcher, Broadcasting/film/video in the Last Year: Never true    Ran Out of Food in the Last Year: Never true  Transportation Needs: No Transportation Needs (01/26/2024)   Epic    Lack of Transportation (Medical): No    Lack of Transportation (Non-Medical): No  Physical Activity: Insufficiently Active (01/26/2024)    Exercise Vital Sign    Days of Exercise per Week: 1 day    Minutes of Exercise per Session: 100 min  Stress: Stress Concern Present (01/26/2024)   [employer] of Occupational Health - Occupational Stress Questionnaire    Feeling of Stress: To some extent  Social Connections: Moderately Integrated (01/26/2024)   Social Connection and Isolation Panel    Frequency of Communication with Friends and Family: Twice a week    Frequency of Social Gatherings with Friends and Family: Once a week    Attends Religious Services: 1 to 4 times per year    Active Member of Clubs or Organizations: No  Attends Banker Meetings: Not on file    Marital Status: Married  Intimate Partner Violence: Not on file  Depression (PHQ2-9): Low Risk (01/19/2024)   Depression (PHQ2-9)    PHQ-2 Score: 0  Alcohol Screen: Low Risk (01/26/2024)   Alcohol Screen    Last Alcohol Screening Score (AUDIT): 1  Housing: Low Risk (01/26/2024)   Epic    Unable to Pay for Housing in the Last Year: No    Number of Times Moved in the Last Year: 0    Homeless in the Last Year: No  Utilities: Not on file  Health Literacy: Not on file    Physical Exam: Vital signs in last 24 hours: @BP  109/73   Pulse 74   Temp 98.2 F (36.8 C) (Skin)   Ht 5' 9 (1.753 m)   Wt 168 lb (76.2 kg)   SpO2 98%   BMI 24.81 kg/m  GEN: NAD EYE: Sclerae anicteric ENT: MMM CV: Non-tachycardic Pulm: CTA b/l GI: Soft, NT/ND NEURO:  Alert & Oriented x 3   Sandor Flatter, DO [facility]   06/15/2024 9:44 AM  "

## 2024-06-15 NOTE — Op Note (Signed)
 [facility] Patient Name: Michael Gibbs Procedure Date: 06/15/2024 9:45 AM MRN: [id number] Endoscopist: Sandor Flatter , MD, [id number] Age: 58 Referring MD:  Date of Birth: 06/14/1966 Gender: Male Account #: [id number] Procedure:                Colonoscopy Indications:              Screening for colorectal malignant neoplasm, This                            is the patient's first colonoscopy Medicines:                Monitored Anesthesia Care Procedure:                Pre-Anesthesia Assessment:                           - Prior to the procedure, a History and Physical                            was performed, and patient medications and                            allergies were reviewed. The patient's tolerance of                            previous anesthesia was also reviewed. The risks                            and benefits of the procedure and the sedation                            options and risks were discussed with the patient.                            All questions were answered, and informed consent                            was obtained. Prior Anticoagulants: The patient has                            taken no anticoagulant or antiplatelet agents                            except for aspirin . ASA Grade Assessment: III - A                            patient with severe systemic disease. After                            reviewing the risks and benefits, the patient was                            deemed in satisfactory condition to undergo the  procedure.                           After obtaining informed consent, the colonoscope                            was passed under direct vision. Throughout the                            procedure, the patient's blood pressure, pulse, and                            oxygen saturations were monitored continuously. The                            CF [id number] #[id number] was introduced through  the anus                            and advanced to the the terminal ileum. The                            colonoscopy was performed without difficulty. The                            patient tolerated the procedure well. The quality                            of the bowel preparation was good. The terminal                            ileum, ileocecal valve, appendiceal orifice, and                            rectum were photographed. Scope In: 9:55:21 AM Scope Out: 10:08:52 AM Scope Withdrawal Time: 0 hours 10 minutes 53 seconds  Total Procedure Duration: 0 hours 13 minutes 31 seconds  Findings:                 The perianal and digital rectal examinations were                            normal.                           A 3 mm polyp was found in the ascending colon. The                            polyp was sessile. The polyp was removed with a                            cold snare. Resection and retrieval were complete.                            Estimated blood loss was minimal.  The exam was otherwise normal throughout the                            remainder of the colon.                           Non-bleeding internal hemorrhoids were found during                            retroflexion. The hemorrhoids were small.                           Anal papilla(e) were hypertrophied.                           The terminal ileum appeared normal. Complications:            No immediate complications. Estimated Blood Loss:     Estimated blood loss was minimal. Impression:               - One 3 mm polyp in the ascending colon, removed                            with a cold snare. Resected and retrieved.                           - Non-bleeding internal hemorrhoids.                           - Anal papilla(e) were hypertrophied.                           - The examined portion of the ileum was normal. Recommendation:           - Patient has a contact number available for                             emergencies. The signs and symptoms of potential                            delayed complications were discussed with the                            patient. Return to normal activities tomorrow.                            Written discharge instructions were provided to the                            patient.                           - Resume previous diet.                           - Continue present medications.                           -  Await pathology results.                           - Repeat colonoscopy in 5-10 years for surveillance                            based on pathology results.                           - Return to GI office PRN. Sandor Flatter, MD 06/15/2024 10:13:05 AM

## 2024-06-15 NOTE — Progress Notes (Signed)
 Called to room to assist during endoscopic procedure.  Patient ID and intended procedure confirmed with present staff. Received instructions for my participation in the procedure from the performing physician.

## 2024-06-15 NOTE — Progress Notes (Signed)
 Pt's states no medical or surgical changes since previsit or office visit.

## 2024-06-18 ENCOUNTER — Telehealth: Payer: Self-pay

## 2024-06-18 NOTE — Telephone Encounter (Signed)
 Follow up call to pt, lm for pt to call if having any difficulty with normal activities or eating and drinking.  Also to call if any other questions or concerns.

## 2024-06-20 LAB — SURGICAL PATHOLOGY

## 2024-06-26 ENCOUNTER — Ambulatory Visit: Payer: Self-pay | Admitting: Gastroenterology

## 2024-06-29 ENCOUNTER — Encounter: Payer: Self-pay | Admitting: Cardiology

## 2024-07-02 ENCOUNTER — Telehealth: Payer: Self-pay | Admitting: Pharmacy Technician

## 2024-07-02 ENCOUNTER — Other Ambulatory Visit (HOSPITAL_COMMUNITY): Payer: Self-pay

## 2024-07-02 NOTE — Telephone Encounter (Signed)
 Pharmacy Patient Advocate Encounter  Received notification from [employer] that Prior Authorization for repatha  has been APPROVED from 07/02/24 to 12/30/24   PA #/Case ID/Reference #: [id number]

## 2024-07-02 NOTE — Telephone Encounter (Signed)
 Pharmacy Patient Advocate Encounter   Received notification from Patient Advice Request messages that prior authorization for repatha  is required/requested.   Insurance verification completed.   The patient is insured through [employer].   Per test claim: PA required; PA submitted to above mentioned insurance via Fax Key/confirmation #/EOC faxed Status is pending

## 2024-09-20 ENCOUNTER — Encounter: Admitting: Family Medicine

## 2025-02-28 ENCOUNTER — Encounter: Admitting: Family Medicine
